# Patient Record
Sex: Male | Born: 1960
Health system: Southern US, Community
[De-identification: ages and names within clinical notes are randomized; demographics above are authoritative.]

## PROBLEM LIST (undated history)

## (undated) DIAGNOSIS — J302 Other seasonal allergic rhinitis: Secondary | ICD-10-CM

## (undated) DIAGNOSIS — E119 Type 2 diabetes mellitus without complications: Secondary | ICD-10-CM

## (undated) DIAGNOSIS — E785 Hyperlipidemia, unspecified: Secondary | ICD-10-CM

## (undated) DIAGNOSIS — K219 Gastro-esophageal reflux disease without esophagitis: Secondary | ICD-10-CM

## (undated) DIAGNOSIS — M1990 Unspecified osteoarthritis, unspecified site: Secondary | ICD-10-CM

## (undated) DIAGNOSIS — N189 Chronic kidney disease, unspecified: Secondary | ICD-10-CM

## (undated) DIAGNOSIS — Z9989 Dependence on other enabling machines and devices: Secondary | ICD-10-CM

## (undated) DIAGNOSIS — I10 Essential (primary) hypertension: Secondary | ICD-10-CM

## (undated) HISTORY — PX: KNEE ARTHROSCOPY: SUR90

## (undated) HISTORY — DX: Unspecified osteoarthritis, unspecified site: M19.90

## (undated) HISTORY — PX: MULTIPLE TOOTH EXTRACTIONS: SHX2053

## (undated) HISTORY — PX: COLONOSCOPY: SHX174

## (undated) HISTORY — PX: UPPER GI ENDOSCOPY: SHX6162

## (undated) HISTORY — PX: HAND SURGERY: SHX662

---

## 1981-12-29 HISTORY — PX: HAND SURGERY: SHX662

## 1984-12-29 HISTORY — PX: KNEE ARTHROSCOPY: SUR90

## 2000-01-22 ENCOUNTER — Emergency Department (HOSPITAL_COMMUNITY): Admission: EM | Admit: 2000-01-22 | Discharge: 2000-01-22 | Payer: Self-pay | Admitting: Emergency Medicine

## 2001-10-04 ENCOUNTER — Emergency Department (HOSPITAL_COMMUNITY): Admission: EM | Admit: 2001-10-04 | Discharge: 2001-10-04 | Payer: Self-pay | Admitting: Emergency Medicine

## 2001-11-23 ENCOUNTER — Emergency Department (HOSPITAL_COMMUNITY): Admission: EM | Admit: 2001-11-23 | Discharge: 2001-11-23 | Payer: Self-pay | Admitting: Emergency Medicine

## 2001-12-23 ENCOUNTER — Encounter: Payer: Self-pay | Admitting: *Deleted

## 2001-12-23 ENCOUNTER — Emergency Department (HOSPITAL_COMMUNITY): Admission: EM | Admit: 2001-12-23 | Discharge: 2001-12-24 | Payer: Self-pay | Admitting: Emergency Medicine

## 2003-04-28 ENCOUNTER — Ambulatory Visit (HOSPITAL_BASED_OUTPATIENT_CLINIC_OR_DEPARTMENT_OTHER): Admission: RE | Admit: 2003-04-28 | Discharge: 2003-04-28 | Payer: Self-pay | Admitting: Urology

## 2005-01-02 ENCOUNTER — Emergency Department (HOSPITAL_COMMUNITY): Admission: EM | Admit: 2005-01-02 | Discharge: 2005-01-02 | Payer: Self-pay | Admitting: Emergency Medicine

## 2005-01-22 ENCOUNTER — Ambulatory Visit: Payer: Self-pay | Admitting: Internal Medicine

## 2005-03-28 ENCOUNTER — Ambulatory Visit: Payer: Self-pay | Admitting: Family Medicine

## 2005-08-22 ENCOUNTER — Ambulatory Visit: Payer: Self-pay | Admitting: Internal Medicine

## 2005-08-28 ENCOUNTER — Encounter: Admission: RE | Admit: 2005-08-28 | Discharge: 2005-08-28 | Payer: Self-pay | Admitting: Internal Medicine

## 2005-08-28 ENCOUNTER — Ambulatory Visit: Payer: Self-pay | Admitting: Internal Medicine

## 2005-09-08 ENCOUNTER — Emergency Department (HOSPITAL_COMMUNITY): Admission: EM | Admit: 2005-09-08 | Discharge: 2005-09-09 | Payer: Self-pay | Admitting: Emergency Medicine

## 2005-09-24 ENCOUNTER — Ambulatory Visit: Payer: Self-pay | Admitting: Internal Medicine

## 2005-10-30 ENCOUNTER — Ambulatory Visit: Payer: Self-pay | Admitting: Internal Medicine

## 2005-11-24 ENCOUNTER — Ambulatory Visit: Payer: Self-pay | Admitting: Internal Medicine

## 2005-12-19 ENCOUNTER — Emergency Department (HOSPITAL_COMMUNITY): Admission: EM | Admit: 2005-12-19 | Discharge: 2005-12-19 | Payer: Self-pay | Admitting: *Deleted

## 2006-01-04 ENCOUNTER — Emergency Department (HOSPITAL_COMMUNITY): Admission: EM | Admit: 2006-01-04 | Discharge: 2006-01-04 | Payer: Self-pay | Admitting: Emergency Medicine

## 2006-01-09 ENCOUNTER — Ambulatory Visit: Payer: Self-pay | Admitting: Internal Medicine

## 2006-02-25 ENCOUNTER — Ambulatory Visit: Payer: Self-pay | Admitting: Internal Medicine

## 2006-02-26 ENCOUNTER — Ambulatory Visit: Payer: Self-pay | Admitting: Internal Medicine

## 2006-03-11 ENCOUNTER — Ambulatory Visit: Payer: Self-pay | Admitting: Internal Medicine

## 2006-04-24 ENCOUNTER — Ambulatory Visit (HOSPITAL_COMMUNITY): Admission: RE | Admit: 2006-04-24 | Discharge: 2006-04-24 | Payer: Self-pay | Admitting: Orthopedic Surgery

## 2006-08-03 ENCOUNTER — Encounter: Payer: Self-pay | Admitting: Internal Medicine

## 2007-05-28 ENCOUNTER — Encounter: Payer: Self-pay | Admitting: Internal Medicine

## 2008-01-07 ENCOUNTER — Telehealth (INDEPENDENT_AMBULATORY_CARE_PROVIDER_SITE_OTHER): Payer: Self-pay | Admitting: *Deleted

## 2010-07-28 ENCOUNTER — Emergency Department (HOSPITAL_COMMUNITY): Admission: EM | Admit: 2010-07-28 | Discharge: 2010-07-28 | Payer: Self-pay | Admitting: Emergency Medicine

## 2011-01-19 ENCOUNTER — Encounter: Payer: Self-pay | Admitting: Orthopedic Surgery

## 2011-07-07 ENCOUNTER — Ambulatory Visit (INDEPENDENT_AMBULATORY_CARE_PROVIDER_SITE_OTHER): Payer: Self-pay | Admitting: General Surgery

## 2014-03-28 DIAGNOSIS — M25569 Pain in unspecified knee: Secondary | ICD-10-CM | POA: Insufficient documentation

## 2014-06-18 DIAGNOSIS — R9431 Abnormal electrocardiogram [ECG] [EKG]: Secondary | ICD-10-CM | POA: Insufficient documentation

## 2014-06-25 ENCOUNTER — Encounter (HOSPITAL_COMMUNITY): Payer: Self-pay | Admitting: Emergency Medicine

## 2014-06-25 ENCOUNTER — Emergency Department (INDEPENDENT_AMBULATORY_CARE_PROVIDER_SITE_OTHER)
Admission: EM | Admit: 2014-06-25 | Discharge: 2014-06-25 | Disposition: A | Discharge status: Home / Self Care | Payer: BC Managed Care – PPO | Source: Home / Self Care | Attending: Emergency Medicine | Admitting: Emergency Medicine

## 2014-06-25 DIAGNOSIS — M7702 Medial epicondylitis, left elbow: Secondary | ICD-10-CM

## 2014-06-25 DIAGNOSIS — M7700 Medial epicondylitis, unspecified elbow: Secondary | ICD-10-CM

## 2014-06-25 HISTORY — DX: Essential (primary) hypertension: I10

## 2014-06-25 MED ORDER — METHYLPREDNISOLONE (PAK) 4 MG PO TABS: ORAL_TABLET | ORAL | Status: DC

## 2014-06-25 MED ORDER — ACETAMINOPHEN 325 MG PO TABS
650.0000 mg | ORAL_TABLET | Freq: Once | ORAL | Status: AC
  Administered 2014-06-25: 650 mg via ORAL

## 2014-06-25 MED ORDER — ACETAMINOPHEN 325 MG PO TABS
ORAL_TABLET | ORAL | Status: AC
  Filled 2014-06-25: qty 2

## 2014-06-25 NOTE — ED Provider Notes (Signed)
CSN: [id number]     Arrival date & time 06/25/14  0944 History   First MD Initiated Contact with Patient 06/25/14 1035     Chief Complaint  Patient presents with  . Elbow Pain   (Consider location/radiation/quality/duration/timing/severity/associated sxs/prior Treatment) HPI Comments: Patient reports 2-3 days of left medial elbow discomfort. 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 Cannot recall a specific injury. No reported hx of gout. Denies arm weakness or paraesthesias. Denies chest pain, dyspnea, palpitations. Pain in elbow is precipitated by extension of arm at elbow. No fever/chills PCP: Dr. Adelene Amas. [place]  The history is provided by the patient.    Past Medical History  Diagnosis Date  . Hypertension    Past Surgical History  Procedure Laterality Date  . Hand surgery     No family history on file. History  Substance Use Topics  . Smoking status: Never Smoker   . Smokeless tobacco: Not on file  . Alcohol Use: No    Review of Systems  All other systems reviewed and are negative.   Allergies  Review of patient's allergies indicates no known allergies.  Home Medications   Prior to Admission medications   Medication Sig Start Date End Date Taking? Authorizing Provider  AMLODIPINE BESYLATE PO Take by mouth.   Yes Historical Provider, MD  nebivolol (BYSTOLIC) 10 MG tablet Take 10 mg by mouth daily.   Yes Historical Provider, MD  OVER THE COUNTER MEDICATION hctz   Yes Historical Provider, MD  POTASSIUM CHLORIDE ER PO Take by mouth.   Yes Historical Provider, MD  methylPREDNIsolone (MEDROL DOSPACK) 4 MG tablet follow package directions 06/25/14   Jess BartersJennifer Lee Presson, PA   BP 171/107  Pulse 67  Temp(Src) 98.1 F (36.7 C) (Oral)  Resp 16  SpO2 96% Physical Exam  Nursing note and vitals reviewed. Constitutional: He is oriented to person, place, and time. He appears well-developed and well-nourished. No distress.  Patient reports  he has not yet taken his blood pressure medication for the day.   HENT:  Head: Normocephalic and atraumatic.  Eyes: Conjunctivae are normal.  Cardiovascular: Normal rate.   Pulmonary/Chest: Effort normal.  Musculoskeletal:       Left elbow: He exhibits decreased range of motion, swelling and effusion. He exhibits no deformity and no laceration. Tenderness found. Medial epicondyle tenderness noted.  Difficulty and discomfort with extension at left elbow. CSM exam of LUE intact.   Neurological: He is alert and oriented to person, place, and time.  Skin: Skin is warm and dry.  Psychiatric: He has a normal mood and affect. His behavior is normal.    ED Course  Procedures (including critical care time) Labs Review Labs Reviewed - No data to display  Imaging Review No results found.   MDM   1. Epicondylitis elbow, medial, left   Tylenol and medrol dose pack as prescribed. Ice and elevation to reduce inflammation and swelling. Advised to take BP medication as directed and is BP remains elevated, to follow up with his PCP ECG: NSR at 68 bpm with nonspecific ST/T wave changes.   Jess BartersJennifer Lee SalemPresson, GeorgiaPA 06/25/14 1126

## 2014-06-25 NOTE — ED Notes (Signed)
Left elbow pain with onset 2 days ago.  No known injury, no fall, no new behaviors.  Patient reports pain is moving into forearm and slightly above elbow.

## 2014-06-25 NOTE — Discharge Instructions (Signed)
Medial Epicondylitis (Golfer's Elbow) with Rehab Medial epicondylitis involves inflammation and pain around the inner (medial) portion of the elbow. This pain is caused by inflammation of the tendons in the forearm that flex (bring down) the wrist. Medial epicondylitis is also called golfer's elbow, because it is common among golfers. However, it may occur in any individual who flexes the wrist regularly. If medial epicondylitis is left untreated, it may become a chronic problem. SYMPTOMS   Pain, tenderness, or inflammation over the inner (medial) side of the elbow.  Pain or weakness with gripping activities.  Pain that increases with wrist twisting motions (using a screwdriver, playing golf, bowling). CAUSES  Medial epicondylitis is caused by inflammation of the tendons that flex the wrist. Causes of injury may include:  Chronic, repetitive stress and strain to the tendons that run from the wrist and forearm to the elbow.  Sudden strain on the forearm, including wrist snap when serving balls with racquet sports, or throwing a baseball. RISK INCREASES WITH:  Sports or occupations that require repetitive and/or strenuous forearm and wrist movements (pitching a baseball, golfing, carpentry).  Poor wrist and forearm strength and flexibility.  Failure to warm up properly before activity.  Resuming activity before healing, rehabilitation, and conditioning are complete. PREVENTION   Warm up and stretch properly before activity.  Maintain physical fitness:  Strength, flexibility, and endurance.  Cardiovascular fitness.  Wear and use properly fitted equipment.  Learn and use proper technique and have a coach correct improper technique.  Wear a tennis elbow (counterforce) brace. PROGNOSIS  The course of this condition depends on the degree of the injury. If treated properly, acute cases (symptoms lasting less than 4 weeks) are often resolved in 2 to 6 weeks. Chronic (longer lasting  cases) often resolve in 3 to 6 months, but may require physical therapy. RELATED COMPLICATIONS   Frequently recurring symptoms, resulting in a chronic problem. Properly treating the problem the first time decreases frequency of recurrence.  Chronic inflammation, scarring, and partial tendon tear, requiring surgery.  Delayed healing or resolution of symptoms. TREATMENT  Treatment first involves the use of ice and medicine, to reduce pain and inflammation. Strengthening and stretching exercises may reduce discomfort, if performed regularly. These exercises may be performed at home, if the condition is an acute injury. Chronic cases may require a referral to a physical therapist for evaluation and treatment. Your caregiver may advise a corticosteroid injection to help reduce inflammation. Rarely, surgery is needed. MEDICATION  If pain medicine is needed, nonsteroidal anti-inflammatory medicines (aspirin and ibuprofen), or other minor pain relievers (acetaminophen), are often advised.  Do not take pain medicine for 7 days before surgery.  Prescription pain relievers may be given, if your caregiver thinks they are needed. Use only as directed and only as much as you need.  Corticosteroid injections may be recommended. These injections should be reserved only for the most severe cases, because they can only be given a certain number of times. HEAT AND COLD  Cold treatment (icing) should be applied for 10 to 15 minutes every 2 to 3 hours for inflammation and pain, and immediately after activity that aggravates your symptoms. Use ice packs or an ice massage.  Heat treatment may be used before performing stretching and strengthening activities prescribed by your caregiver, physical therapist, or athletic trainer. Use a heat pack or a warm water soak. SEEK MEDICAL CARE IF: Symptoms get worse or do not improve in 2 weeks, despite treatment. EXERCISES  RANGE OF MOTION (  ROM) AND STRETCHING EXERCISES -  Epicondylitis, Medial (Golfer's Elbow) These exercises may help you when beginning to rehabilitate your injury. Your symptoms may go away with or without further involvement from your physician, physical therapist or athletic trainer. While completing these exercises, remember:   Restoring tissue flexibility helps normal motion to return to the joints. This allows healthier, less painful movement and activity.  An effective stretch should be held for at least 30 seconds.  A stretch should never be painful. You should only feel a gentle lengthening or release in the stretched tissue. RANGE OF MOTION - Wrist Flexion, Active-Assisted  Extend your right / left elbow with your fingers pointing down.*  Gently pull the back of your hand towards you, until you feel a gentle stretch on the top of your forearm.  Hold this position for __________ seconds. Repeat __________ times. Complete this exercise __________ times per day.  *If directed by your physician, physical therapist or athletic trainer, complete this stretch with your elbow bent, rather than extended. RANGE OF MOTION - Wrist Extension, Active-Assisted  Extend your right / left elbow and turn your palm upwards.*  Gently pull your palm and fingertips back, so your wrist extends and your fingers point more toward the ground.  You should feel a gentle stretch on the inside of your forearm.  Hold this position for __________ seconds. Repeat __________ times. Complete this exercise __________ times per day. *If directed by your physician, physical therapist or athletic trainer, complete this stretch with your elbow bent, rather than extended. STRETCH - Wrist Extension   Place your right / left fingertips on a tabletop leaving your elbow slightly bent. Your fingers should point backwards.  Gently press your fingers and palm down onto the table, by straightening your elbow. You should feel a stretch on the inside of your forearm.  Hold  this position for __________ seconds. Repeat __________ times. Complete this stretch __________ times per day.  STRENGTHENING EXERCISES - Epicondylitis, Medial (Golfer's Elbow) These exercises may help you when beginning to rehabilitate your injury. They may resolve your symptoms with or without further involvement from your physician, physical therapist or athletic trainer. While completing these exercises, remember:   Muscles can gain both the endurance and the strength needed for everyday activities through controlled exercises.  Complete these exercises as instructed by your physician, physical therapist or athletic trainer. Increase the resistance and repetitions only as guided.  You may experience muscle soreness or fatigue, but the pain or discomfort you are trying to eliminate should never worsen during these exercises. If this pain does get worse, stop and make sure you are following the directions exactly. If the pain is still present after adjustments, discontinue the exercise until you can discuss the trouble with your caregiver. STRENGTH - Wrist Flexors  Sit with your right / left forearm palm-up, and fully supported on a table or countertop. Your elbow should be resting below the height of your shoulder. Allow your wrist to extend over the edge of the surface.  Loosely holding a __________ weight, or a piece of rubber exercise band or tubing, slowly curl your hand up toward your forearm.  Hold this position for __________ seconds. Slowly lower the wrist back to the starting position in a controlled manner. Repeat __________ times. Complete this exercise __________ times per day.  STRENGTH - Wrist Extensors  Sit with your right / left forearm palm-down and fully supported. Your elbow should be resting below the height of your shoulder.   Allow your wrist to extend over the edge of the surface.  Loosely holding a __________ weight, or a piece of rubber exercise band or tubing, slowly  curl your hand up toward your forearm.  Hold this position for __________ seconds. Slowly lower the wrist back to the starting position in a controlled manner. Repeat __________ times. Complete this exercise __________ times per day.  STRENGTH - Ulnar Deviators  Stand with a ____________________ weight in your right / left hand, or sit while holding a rubber exercise band or tubing, with your healthy arm supported on a table or countertop.  Move your wrist so that your pinkie travels toward your forearm and your thumb moves away from your forearm.  Hold this position for __________ seconds and then slowly lower the wrist back to the starting position. Repeat __________ times. Complete this exercise __________ times per day STRENGTH - Grip   Grasp a tennis ball, a dense sponge, or a large, rolled sock in your hand.  Squeeze as hard as you can, without increasing any pain.  Hold this position for __________ seconds. Release your grip slowly. Repeat __________ times. Complete this exercise __________ times per day.  STRENGTH - Forearm Supinators   Sit with your right / left forearm supported on a table, keeping your elbow below shoulder height. Rest your hand over the edge, palm down.  Gently grip a hammer or a soup ladle.  Without moving your elbow, slowly turn your palm and hand upward to a "thumbs-up" position.  Hold this position for __________ seconds. Slowly return to the starting position. Repeat __________ times. Complete this exercise __________ times per day.  STRENGTH - Forearm Pronators  Sit with your right / left forearm supported on a table, keeping your elbow below shoulder height. Rest your hand over the edge, palm up.  Gently grip a hammer or a soup ladle.  Without moving your elbow, slowly turn your palm and hand upward to a "thumbs-up" position.  Hold this position for __________ seconds. Slowly return to the starting position. Repeat __________ times. Complete  this exercise __________ times per day.  Document Released: 12/15/2005 Document Revised: 03/08/2012 Document Reviewed: 03/29/2009 [employer] Patient Information 2015 [employer]. This information is not intended to replace advice given to you by your health care provider. Make sure you discuss any questions you have with your health care provider.  

## 2014-06-26 NOTE — ED Provider Notes (Signed)
Medical screening examination/treatment/procedure(s) were performed by non-physician practitioner and as supervising physician I was immediately available for consultation/collaboration.  Leslee Homeavid Keller, M.D.  Reuben Likesavid C Keller, MD 06/26/14 [phone]

## 2015-01-05 DIAGNOSIS — M25562 Pain in left knee: Secondary | ICD-10-CM

## 2015-01-05 DIAGNOSIS — M25561 Pain in right knee: Secondary | ICD-10-CM | POA: Insufficient documentation

## 2015-01-05 DIAGNOSIS — M25552 Pain in left hip: Secondary | ICD-10-CM | POA: Insufficient documentation

## 2015-05-30 DIAGNOSIS — M170 Bilateral primary osteoarthritis of knee: Secondary | ICD-10-CM | POA: Insufficient documentation

## 2015-08-03 ENCOUNTER — Other Ambulatory Visit: Payer: Self-pay | Admitting: Family Medicine

## 2015-08-03 ENCOUNTER — Ambulatory Visit
Admission: RE | Admit: 2015-08-03 | Discharge: 2015-08-03 | Disposition: A | Discharge status: Home / Self Care | Payer: BLUE CROSS/BLUE SHIELD | Source: Ambulatory Visit | Attending: Family Medicine | Admitting: Family Medicine

## 2015-08-03 DIAGNOSIS — M25522 Pain in left elbow: Secondary | ICD-10-CM

## 2015-09-01 ENCOUNTER — Emergency Department (HOSPITAL_COMMUNITY)
Admission: EM | Admit: 2015-09-01 | Discharge: 2015-09-02 | Disposition: A | Discharge status: Home / Self Care | Payer: BLUE CROSS/BLUE SHIELD | Attending: Emergency Medicine | Admitting: Emergency Medicine

## 2015-09-01 ENCOUNTER — Encounter (HOSPITAL_COMMUNITY): Payer: Self-pay | Admitting: Emergency Medicine

## 2015-09-01 ENCOUNTER — Emergency Department (HOSPITAL_COMMUNITY): Payer: BLUE CROSS/BLUE SHIELD

## 2015-09-01 DIAGNOSIS — S92412A Displaced fracture of proximal phalanx of left great toe, initial encounter for closed fracture: Secondary | ICD-10-CM | POA: Diagnosis not present

## 2015-09-01 DIAGNOSIS — S50312A Abrasion of left elbow, initial encounter: Secondary | ICD-10-CM | POA: Insufficient documentation

## 2015-09-01 DIAGNOSIS — S92312A Displaced fracture of first metatarsal bone, left foot, initial encounter for closed fracture: Secondary | ICD-10-CM | POA: Diagnosis not present

## 2015-09-01 DIAGNOSIS — S0181XA Laceration without foreign body of other part of head, initial encounter: Secondary | ICD-10-CM | POA: Insufficient documentation

## 2015-09-01 DIAGNOSIS — Y998 Other external cause status: Secondary | ICD-10-CM | POA: Diagnosis not present

## 2015-09-01 DIAGNOSIS — S80812A Abrasion, left lower leg, initial encounter: Secondary | ICD-10-CM | POA: Diagnosis not present

## 2015-09-01 DIAGNOSIS — S92522A Displaced fracture of medial phalanx of left lesser toe(s), initial encounter for closed fracture: Secondary | ICD-10-CM | POA: Diagnosis not present

## 2015-09-01 DIAGNOSIS — S92512A Displaced fracture of proximal phalanx of left lesser toe(s), initial encounter for closed fracture: Secondary | ICD-10-CM | POA: Diagnosis not present

## 2015-09-01 DIAGNOSIS — S92332A Displaced fracture of third metatarsal bone, left foot, initial encounter for closed fracture: Secondary | ICD-10-CM | POA: Insufficient documentation

## 2015-09-01 DIAGNOSIS — Y9389 Activity, other specified: Secondary | ICD-10-CM | POA: Diagnosis not present

## 2015-09-01 DIAGNOSIS — S92352A Displaced fracture of fifth metatarsal bone, left foot, initial encounter for closed fracture: Secondary | ICD-10-CM | POA: Diagnosis not present

## 2015-09-01 DIAGNOSIS — S80811A Abrasion, right lower leg, initial encounter: Secondary | ICD-10-CM | POA: Diagnosis not present

## 2015-09-01 DIAGNOSIS — S92912A Unspecified fracture of left toe(s), initial encounter for closed fracture: Secondary | ICD-10-CM

## 2015-09-01 DIAGNOSIS — S99912A Unspecified injury of left ankle, initial encounter: Secondary | ICD-10-CM | POA: Diagnosis not present

## 2015-09-01 DIAGNOSIS — S92342A Displaced fracture of fourth metatarsal bone, left foot, initial encounter for closed fracture: Secondary | ICD-10-CM | POA: Insufficient documentation

## 2015-09-01 DIAGNOSIS — I10 Essential (primary) hypertension: Secondary | ICD-10-CM | POA: Diagnosis not present

## 2015-09-01 DIAGNOSIS — S99922A Unspecified injury of left foot, initial encounter: Secondary | ICD-10-CM | POA: Diagnosis present

## 2015-09-01 DIAGNOSIS — Y92410 Unspecified street and highway as the place of occurrence of the external cause: Secondary | ICD-10-CM | POA: Diagnosis not present

## 2015-09-01 DIAGNOSIS — Z79899 Other long term (current) drug therapy: Secondary | ICD-10-CM | POA: Insufficient documentation

## 2015-09-01 DIAGNOSIS — S92302A Fracture of unspecified metatarsal bone(s), left foot, initial encounter for closed fracture: Secondary | ICD-10-CM
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
## 2015-09-02 ENCOUNTER — Emergency Department (HOSPITAL_COMMUNITY): Payer: BLUE CROSS/BLUE SHIELD

## 2015-09-02 DIAGNOSIS — S92412A Displaced fracture of proximal phalanx of left great toe, initial encounter for closed fracture: Secondary | ICD-10-CM | POA: Diagnosis not present

## 2015-09-02 MED ORDER — HYDROCODONE-ACETAMINOPHEN 5-325 MG PO TABS
1.0000 | ORAL_TABLET | Freq: Once | ORAL | Status: AC
  Administered 2015-09-02: 1 via ORAL
  Filled 2015-09-02: qty 1

## 2015-09-02 MED ORDER — HYDROCODONE-ACETAMINOPHEN 5-325 MG PO TABS: 1.0000 | ORAL_TABLET | ORAL | Status: DC | PRN
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
## 2015-09-02 NOTE — Discharge Instructions (Signed)
-   Call Dr. Dion Saucier with orthopedics to schedule a follow up appointment - Vicodin as needed for pain control - Ice left foot and left hand for pain control - Return to ED with worsening left foot pain, increased swelling of foot, loss of sensation in foot or toes, fevers, color changes of foot or toes, headaches, vision changes, new or worsening pain elsewhere or further worsening of symptoms

## 2015-09-04 DIAGNOSIS — M25572 Pain in left ankle and joints of left foot: Secondary | ICD-10-CM

## 2015-09-04 DIAGNOSIS — S92255A Nondisplaced fracture of navicular [scaphoid] of left foot, initial encounter for closed fracture: Secondary | ICD-10-CM | POA: Insufficient documentation

## 2015-09-04 DIAGNOSIS — M25571 Pain in right ankle and joints of right foot: Secondary | ICD-10-CM | POA: Insufficient documentation

## 2015-09-04 DIAGNOSIS — M79671 Pain in right foot: Secondary | ICD-10-CM | POA: Insufficient documentation

## 2015-09-04 DIAGNOSIS — M79672 Pain in left foot: Secondary | ICD-10-CM

## 2016-02-10 ENCOUNTER — Emergency Department (HOSPITAL_COMMUNITY): Payer: BLUE CROSS/BLUE SHIELD

## 2016-02-10 ENCOUNTER — Encounter (HOSPITAL_COMMUNITY): Payer: Self-pay

## 2016-02-10 ENCOUNTER — Emergency Department (HOSPITAL_COMMUNITY)
Admission: EM | Admit: 2016-02-10 | Discharge: 2016-02-10 | Disposition: A | Discharge status: Home / Self Care | Payer: BLUE CROSS/BLUE SHIELD | Attending: Emergency Medicine | Admitting: Emergency Medicine

## 2016-02-10 DIAGNOSIS — I10 Essential (primary) hypertension: Secondary | ICD-10-CM | POA: Insufficient documentation

## 2016-02-10 DIAGNOSIS — M25522 Pain in left elbow: Secondary | ICD-10-CM | POA: Diagnosis present

## 2016-02-10 DIAGNOSIS — M7712 Lateral epicondylitis, left elbow: Secondary | ICD-10-CM | POA: Diagnosis not present

## 2016-02-10 DIAGNOSIS — Z79899 Other long term (current) drug therapy: Secondary | ICD-10-CM | POA: Diagnosis not present

## 2016-02-10 LAB — SYNOVIAL CELL COUNT + DIFF, W/ CRYSTALS
Crystals, Fluid: NONE SEEN
Lymphocytes-Synovial Fld: 12 % (ref 0–20)
Neutrophil, Synovial: 88 % — ABNORMAL HIGH (ref 0–25)

## 2016-02-10 LAB — CBC WITH DIFFERENTIAL/PLATELET
Basophils Absolute: 0 K/uL (ref 0.0–0.1)
Basophils Relative: 0 %
Eosinophils Absolute: 0.1 K/uL (ref 0.0–0.7)
Eosinophils Relative: 1 %
HCT: 37.9 % — ABNORMAL LOW (ref 39.0–52.0)
Hemoglobin: 12.5 g/dL — ABNORMAL LOW (ref 13.0–17.0)
Lymphocytes Relative: 15 %
Lymphs Abs: 2 K/uL (ref 0.7–4.0)
MCH: 28 pg (ref 26.0–34.0)
MCHC: 33 g/dL (ref 30.0–36.0)
MCV: 84.8 fL (ref 78.0–100.0)
Monocytes Absolute: 1 K/uL (ref 0.1–1.0)
Monocytes Relative: 8 %
Neutro Abs: 9.8 K/uL — ABNORMAL HIGH (ref 1.7–7.7)
Neutrophils Relative %: 76 %
Platelets: 279 K/uL (ref 150–400)
RBC: 4.47 MIL/uL (ref 4.22–5.81)
RDW: 13.4 % (ref 11.5–15.5)
WBC: 12.9 K/uL — ABNORMAL HIGH (ref 4.0–10.5)

## 2016-02-10 LAB — GLUCOSE, SYNOVIAL FLUID: Glucose, Synovial Fluid: <20 mg/dL

## 2016-02-10 LAB — BASIC METABOLIC PANEL WITH GFR
Anion gap: 8 (ref 5–15)
BUN: 15 mg/dL (ref 6–20)
CO2: 28 mmol/L (ref 22–32)
Calcium: 9.2 mg/dL (ref 8.9–10.3)
Chloride: 102 mmol/L (ref 101–111)
Creatinine, Ser: 0.93 mg/dL (ref 0.61–1.24)
GFR calc Af Amer: >60 mL/min
GFR calc non Af Amer: >60 mL/min
Glucose, Bld: 140 mg/dL — ABNORMAL HIGH (ref 65–99)
Potassium: 3 mmol/L — ABNORMAL LOW (ref 3.5–5.1)
Sodium: 138 mmol/L (ref 135–145)

## 2016-02-10 LAB — SEDIMENTATION RATE: Sed Rate: 25 mm/h — ABNORMAL HIGH (ref 0–16)

## 2016-02-10 LAB — URIC ACID, SYNOVIAL FLUID: Uric Acid, Synovial Fluid: 7.7 mg/dL (ref 0.0–8.0)

## 2016-02-10 LAB — PROTEIN, SYNOVIAL FLUID: Protein, Synovial Fluid: 4.1 g/dL — ABNORMAL HIGH (ref 1.0–3.0)

## 2016-02-10 MED ORDER — PREDNISONE 20 MG PO TABS
60.0000 mg | ORAL_TABLET | Freq: Once | ORAL | Status: AC
  Administered 2016-02-10: 60 mg via ORAL
  Filled 2016-02-10: qty 3

## 2016-02-10 MED ORDER — OXYCODONE-ACETAMINOPHEN 5-325 MG PO TABS
1.0000 | ORAL_TABLET | Freq: Once | ORAL | Status: AC
  Administered 2016-02-10: 1 via ORAL
  Filled 2016-02-10: qty 1

## 2016-02-10 MED ORDER — LIDOCAINE-EPINEPHRINE 2 %-1:100000 IJ SOLN
20.0000 mL | Freq: Once | INTRAMUSCULAR | Status: AC
  Administered 2016-02-10: 20 mL via INTRADERMAL
  Filled 2016-02-10: qty 1

## 2016-02-10 MED ORDER — OXYCODONE-ACETAMINOPHEN 5-325 MG PO TABS: 2.0000 | ORAL_TABLET | ORAL | Status: DC | PRN

## 2016-02-10 MED ORDER — POTASSIUM CHLORIDE 10 MEQ/100ML IV SOLN
10.0000 meq | Freq: Once | INTRAVENOUS | Status: AC
  Administered 2016-02-10: 10 meq via INTRAVENOUS
  Filled 2016-02-10: qty 100

## 2016-02-10 MED ORDER — PREDNISONE 10 MG PO TABS: 60.0000 mg | ORAL_TABLET | Freq: Every day | ORAL | Status: DC

## 2016-02-10 NOTE — Discharge Instructions (Signed)
°  Tendinitis Tendinitis is swelling and inflammation of the tendons. Tendons are band-like tissues that connect muscle to bone. Tendinitis commonly occurs in the:   Shoulders (rotator cuff).  Heels (Achilles tendon).  Elbows (triceps tendon). CAUSES Tendinitis is usually caused by overusing the tendon, muscles, and joints involved. When the tissue surrounding a tendon (synovium) becomes inflamed, it is called tenosynovitis. Tendinitis commonly develops in people whose jobs require repetitive motions. SYMPTOMS  Pain.  Tenderness.  Mild swelling. DIAGNOSIS Tendinitis is usually diagnosed by physical exam. Your health care provider may also order X-rays or other imaging tests. TREATMENT Your health care provider may recommend certain medicines or exercises for your treatment. HOME CARE INSTRUCTIONS   Use a sling or splint for as long as directed by your health care provider until the pain decreases.  Put ice on the injured area.  Put ice in a plastic bag.  Place a towel between your skin and the bag.  Leave the ice on for 15-20 minutes, 3-4 times a day, or as directed by your health care provider.  Avoid using the limb while the tendon is painful. Perform gentle range of motion exercises only as directed by your health care provider. Stop exercises if pain or discomfort increase, unless directed otherwise by your health care provider.  Only take over-the-counter or prescription medicines for pain, discomfort, or fever as directed by your health care provider. SEEK MEDICAL CARE IF:   Your pain and swelling increase.  You develop new, unexplained symptoms, especially increased numbness in the hands. MAKE SURE YOU:   Understand these instructions.  Will watch your condition.  Will get help right away if you are not doing well or get worse.   This information is not intended to replace advice given to you by your health care provider. Make sure you discuss any questions you  have with your health care provider.  Follow up with orthopedic provider tomorrow for re-evaluation. Take steroids as prescribed. Return to the ED if you experience severe worsening of your symptoms, increased redness or warmth of elbow, fever, chills, decreased range of motion of elbow.

## 2016-02-10 NOTE — ED Provider Notes (Signed)
CSN: [id number]     Arrival date & time 02/10/16  0808 History   First MD Initiated Contact with Patient 02/10/16 0825     Chief Complaint  Patient presents with  . Elbow Pain     (Consider location/radiation/quality/duration/timing/severity/associated sxs/prior Treatment) HPI  Kristopher [name] is a 55 y.o M who presents to the emergency department today complaining of left elbow pain and swelling. Patient states that his left elbow began hurting 2 days ago. His elbow is now warm to the touch and swollen. Patient is unable to extend his arm due to pain. Patient has history of same symptoms and was diagnosed with epicondylitis. He had a joint effusion at this time. He was treated with anti-inflammatories and his symptoms resolved. No history of gout or septic joint. No known trauma or injury. Denies fevers, chills. Patient has not tried anything to relieve his symptoms.   Past Medical History  Diagnosis Date  . Hypertension    Past Surgical History  Procedure Laterality Date  . Hand surgery     History reviewed. No pertinent family history. Social History  Substance Use Topics  . Smoking status: Never Smoker   . Smokeless tobacco: None  . Alcohol Use: Yes     Comment: occasional    Review of Systems  All other systems reviewed and are negative.     Allergies  Review of patient's allergies indicates no known allergies.  Home Medications   Prior to Admission medications   Medication Sig Start Date End Date Taking? Authorizing Provider  AMLODIPINE BESYLATE PO Take by mouth.    Historical Provider, MD  amLODipine-benazepril (LOTREL) 5-20 MG per capsule Take 1 capsule by mouth daily.    Historical Provider, MD  HYDROcodone-acetaminophen (NORCO/VICODIN) 5-325 MG per tablet Take 1-2 tablets by mouth every 4 (four) hours as needed. 09/02/15   Stevi Barrett, PA-C  methylPREDNIsolone (MEDROL DOSPACK) 4 MG tablet follow package directions Patient not taking: Reported on 09/01/2015  06/25/14   Audelia Hives Presson, PA  nebivolol (BYSTOLIC) 10 MG tablet Take 10 mg by mouth daily.    Historical Provider, MD  OVER THE COUNTER MEDICATION hctz    Historical Provider, MD  potassium chloride (K-DUR) 10 MEQ tablet Take 10 mEq by mouth daily.    Historical Provider, MD  POTASSIUM CHLORIDE ER PO Take by mouth.    Historical Provider, MD  triamterene-hydrochlorothiazide (MAXZIDE-25) 37.5-25 MG per tablet Take 1 tablet by mouth daily.    Historical Provider, MD   BP 170/96 mmHg  Pulse 81  Temp(Src) 98.6 F (37 C) (Oral)  Resp 18  SpO2 97% Physical Exam  Constitutional: He is oriented to person, place, and time. He appears well-developed and well-nourished. No distress.  HENT:  Head: Normocephalic and atraumatic.  Eyes: Conjunctivae are normal. Right eye exhibits no discharge. Left eye exhibits no discharge. No scleral icterus.  Cardiovascular: Normal rate.   Pulmonary/Chest: Effort normal.  Musculoskeletal:       Left elbow: He exhibits decreased range of motion, swelling and effusion. Tenderness found. Lateral epicondyle tenderness noted.  Neurological: He is alert and oriented to person, place, and time. Coordination normal.  Skin: Skin is warm and dry. No rash noted. He is not diaphoretic. No erythema. No pallor.  Psychiatric: He has a normal mood and affect. His behavior is normal.  Nursing note and vitals reviewed.   ED Course  Procedures (including critical care time)  Apiration of blood/fluid Performed by: Dr. Darl Householder Consent obtained. Required items: required  blood products, implants, devices, and special equipment available Patient identity confirmed: verbally with patient Time out: Immediately prior to procedure a "time out" was called to verify the correct patient, procedure, equipment, support staff and site/side marked as required. Preparation: Patient was prepped and draped in the usual sterile fashion. Patient tolerance: Patient tolerated the procedure well  with no immediate complications.  Location of aspiration: left elbow    Labs Review Labs Reviewed  PROTEIN, SYNOVIAL FLUID - Abnormal; Notable for the following:    Protein, Synovial Fluid 4.1 (*)    All other components within normal limits  SYNOVIAL CELL COUNT + DIFF, W/ CRYSTALS - Abnormal; Notable for the following:    Color, Synovial ORANGE (*)    Appearance-Synovial TURBID (*)    Neutrophil, Synovial 88 (*)    All other components within normal limits  BASIC METABOLIC PANEL - Abnormal; Notable for the following:    Potassium 3.0 (*)    Glucose, Bld 140 (*)    All other components within normal limits  CBC WITH DIFFERENTIAL/PLATELET - Abnormal; Notable for the following:    WBC 12.9 (*)    Hemoglobin 12.5 (*)    HCT 37.9 (*)    Neutro Abs 9.8 (*)    All other components within normal limits  SEDIMENTATION RATE - Abnormal; Notable for the following:    Sed Rate 25 (*)    All other components within normal limits  BODY FLUID CULTURE  GLUCOSE, SYNOVIAL FLUID  URIC ACID, SYNOVIAL FLUID    Imaging Review Dg Elbow Complete Left  02/10/2016  CLINICAL DATA:  Pain and swelling for 2 days EXAM: LEFT ELBOW - COMPLETE 3+ VIEW COMPARISON:  August 03, 2015 FINDINGS: Frontal, lateral, and bilateral oblique views were obtained. There is generalized soft tissue swelling. There is no fracture or dislocation. There is no appreciable joint effusion. There is osteoarthritic change in the elbow joint with areas of spurring in the distal humerus and proximal radial regions. No erosive change. IMPRESSION: Soft tissue swelling. Osteoarthritic change. No fracture or dislocation. Electronically Signed   By: Lowella Grip III M.D.   On: 02/10/2016 09:30   I have personally reviewed and evaluated these images and lab results as part of my medical decision-making.   EKG Interpretation None      MDM   Final diagnoses:  Lateral epicondylitis, left    28s external male presents to the  emergency department today complaining of left elbow pain, swelling and redness. Patient has had episodes of this in the past and was diagnosed with epicondylitis and given anti-inflammatories with resolution of his symptoms. However, today patient states the pain is worse and he is unable to straighten his elbow all the way. Patient has approximately 30 of range of motion with significant pain. Elbow is red and warm. No history of gout. Concern for septic joint. No fevers. Dr. Darl Householder was able to visualize a joint effusion by ultrasound. Joint aspiration was performed. ESR was mildly elevated at 25. Unfortunately, the specimen clotted and the white blood cell count was unable to be obtained. Neutrophils elevated at 88. No organisms seen on Gram stain. Mild leukocytosis at 12.9. Suspect this is due to inflammation, less likely septic joint given these findings. Spoke with Dr. Mardelle Matte who agrees and recommends steroids and pain medication. He will follow the cultures from her synovial fluid and follow-up with the patient and his office on Monday. Return precautions outlined in patient discharge instructions.  Patient was discussed with and  seen by Dr. Darl Householder who agrees with the treatment plan.      Dondra Spry [place], PA-C 02/11/16 1222  Wandra Arthurs, MD 02/13/16 [id number]

## 2016-02-10 NOTE — ED Notes (Signed)
Pt with elbow pain since Friday.  Left elbow swollen.  No injury recalled.

## 2016-02-14 LAB — BODY FLUID CULTURE
Culture: NO GROWTH
Gram Stain: NONE SEEN

## 2016-07-15 DIAGNOSIS — I10 Essential (primary) hypertension: Secondary | ICD-10-CM | POA: Insufficient documentation

## 2016-08-08 DIAGNOSIS — M170 Bilateral primary osteoarthritis of knee: Secondary | ICD-10-CM | POA: Diagnosis not present

## 2016-09-09 DIAGNOSIS — K5730 Diverticulosis of large intestine without perforation or abscess without bleeding: Secondary | ICD-10-CM | POA: Insufficient documentation
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
## 2016-10-24 ENCOUNTER — Emergency Department (HOSPITAL_COMMUNITY): Payer: Worker's Compensation

## 2016-10-24 ENCOUNTER — Emergency Department (HOSPITAL_COMMUNITY)
Admission: EM | Admit: 2016-10-24 | Discharge: 2016-10-24 | Disposition: A | Discharge status: Home / Self Care | Payer: Worker's Compensation | Attending: Emergency Medicine | Admitting: Emergency Medicine

## 2016-10-24 DIAGNOSIS — Y990 Civilian activity done for income or pay: Secondary | ICD-10-CM | POA: Insufficient documentation

## 2016-10-24 DIAGNOSIS — Y929 Unspecified place or not applicable: Secondary | ICD-10-CM | POA: Insufficient documentation

## 2016-10-24 DIAGNOSIS — Z79899 Other long term (current) drug therapy: Secondary | ICD-10-CM | POA: Insufficient documentation

## 2016-10-24 DIAGNOSIS — I10 Essential (primary) hypertension: Secondary | ICD-10-CM | POA: Insufficient documentation

## 2016-10-24 DIAGNOSIS — Z791 Long term (current) use of non-steroidal anti-inflammatories (NSAID): Secondary | ICD-10-CM | POA: Insufficient documentation

## 2016-10-24 DIAGNOSIS — S0990XA Unspecified injury of head, initial encounter: Secondary | ICD-10-CM | POA: Diagnosis present

## 2016-10-24 DIAGNOSIS — S0101XA Laceration without foreign body of scalp, initial encounter: Secondary | ICD-10-CM | POA: Diagnosis not present

## 2016-10-24 DIAGNOSIS — Y9389 Activity, other specified: Secondary | ICD-10-CM | POA: Insufficient documentation

## 2016-10-24 DIAGNOSIS — W208XXA Other cause of strike by thrown, projected or falling object, initial encounter: Secondary | ICD-10-CM | POA: Insufficient documentation

## 2016-10-24 MED ORDER — AMLODIPINE BESY-BENAZEPRIL HCL 5-20 MG PO CAPS: 1.0000 | ORAL_CAPSULE | Freq: Every day | ORAL | 0 refills | Status: DC

## 2016-10-24 NOTE — ED Triage Notes (Signed)
Pt BIB [facility] c/o large 871ft by 561ft piece of metal fell about 20 ft and struck him in the R parietal area of his head. Horseshoe-shaped lacerated noted to area. Bleeding controlled and covered at this time. Pt denies LOC, fall, blurred vision, or N/V. Only complaint is head pain 7/10. Pt out of his BP meds x2 days. BP is elevated at this time. A&Ox4.

## 2016-10-24 NOTE — ED Notes (Signed)
Bed: WA02 Expected date:  Expected time:  Means of arrival:  Comments: Hold 2 head injury

## 2016-10-24 NOTE — ED Notes (Signed)
Patient d/c'd self care.  F/U and medication reviewed.  Patient verbalized understanding. 

## 2016-10-24 NOTE — ED Notes (Signed)
Patient requesting blood pressure medications until he can get his prescription filled.  Will notify MD.

## 2016-10-24 NOTE — ED Provider Notes (Signed)
[facility]-EMERGENCY DEPT Provider Note   CSN: [id number] Arrival date & time: 10/24/16  1746     History   Chief Complaint Chief Complaint  Patient presents with  . Head Injury    HPI Kristopher McalpineVinson W Babino Sr. is a 55 y.o. male.  Complaint is head injury. Patient was at work at an [employer] ([employer]air[employer] tonight. There was a piece of metal scaffolding called walking" that allows workers to walk along the edge of the aircraft. He was "spotting" another worker who was walking on it. The scaffolding gave way and fell backwards. This was a piece of flat metal scaffolding estimated weight 25-30 pounds. Larey SeatFell off of the edge above his head struck him on his right parietal scalp. He has some bleeding in the laceration. He was not knocked to the ground. Did not lose consciousness. Complains of minimal pain. Does not take blood thinner.  HPI  Past Medical History:  Diagnosis Date  . Hypertension     There are no active problems to display for this patient.   Past Surgical History:  Procedure Laterality Date  . HAND SURGERY         Home Medications    Prior to Admission medications   Medication Sig Start Date End Date Taking? Authorizing Provider  amLODipine-benazepril (LOTREL) 5-20 MG capsule Take 2 capsules by mouth every morning.   Yes Historical Provider, MD  naproxen sodium (ANAPROX) 220 MG tablet Take 220 mg by mouth 2 (two) times daily as needed (knee pain.).   Yes Historical Provider, MD  nebivolol (BYSTOLIC) 10 MG tablet Take 10 mg by mouth daily.   Yes Historical Provider, MD  potassium chloride (K-DUR) 10 MEQ tablet Take 10 mEq by mouth daily.   Yes Historical Provider, MD  triamterene-hydrochlorothiazide (MAXZIDE-25) 37.5-25 MG per tablet Take 1 tablet by mouth daily.   Yes Historical Provider, MD    Family History No family history on file.  Social History Social History  Substance Use Topics  . Smoking status: Never Smoker  . Smokeless tobacco: Not on file  . Alcohol  use Yes     Comment: occasional     Allergies   Review of patient's allergies indicates no known allergies.   Review of Systems Review of Systems  Constitutional: Negative for appetite change, chills, diaphoresis, fatigue and fever.  HENT: Negative for mouth sores, sore throat and trouble swallowing.   Eyes: Negative for visual disturbance.  Respiratory: Negative for cough, chest tightness, shortness of breath and wheezing.   Cardiovascular: Negative for chest pain.  Gastrointestinal: Negative for abdominal distention, abdominal pain, diarrhea, nausea and vomiting.  Endocrine: Negative for polydipsia, polyphagia and polyuria.  Genitourinary: Negative for dysuria, frequency and hematuria.  Musculoskeletal: Negative for gait problem.  Skin: Positive for wound. Negative for color change, pallor and rash.  Neurological: Positive for headaches. Negative for dizziness, syncope and light-headedness.  Hematological: Does not bruise/bleed easily.  Psychiatric/Behavioral: Negative for behavioral problems and confusion.     Physical Exam Updated Vital Signs BP (!) 161/104 (BP Location: Right Arm)   Pulse 62   Temp 98.2 F (36.8 C) (Oral)   Resp 19   Ht 6\' 2"  (1.88 m)   Wt 262 lb (118.8 kg)   SpO2 95%   BMI 33.64 kg/m   Physical Exam  Constitutional: He is oriented to person, place, and time. He appears well-developed and well-nourished. No distress.  HENT:  Head: Normocephalic.    Eyes: Conjunctivae are normal. Pupils are equal, round, and  reactive to light. No scleral icterus.  Neck: Normal range of motion. Neck supple. No thyromegaly present.  Cardiovascular: Normal rate and regular rhythm.  Exam reveals no gallop and no friction rub.   No murmur heard. Pulmonary/Chest: Effort normal and breath sounds normal. No respiratory distress. He has no wheezes. He has no rales.  Abdominal: Soft. Bowel sounds are normal. He exhibits no distension. There is no tenderness. There is no  rebound.  Musculoskeletal: Normal range of motion.  Neurological: He is alert and oriented to person, place, and time.  Skin: Skin is warm and dry. No rash noted.  Psychiatric: He has a normal mood and affect. His behavior is normal.     ED Treatments / Results  Labs (all labs ordered are listed, but only abnormal results are displayed) Labs Reviewed - No data to display  EKG  EKG Interpretation None       Radiology Ct Head Wo Contrast  Result Date: 10/24/2016 CLINICAL DATA:  Right parietal impact with piece of falling metal from scaffolding. EXAM: CT HEAD WITHOUT CONTRAST TECHNIQUE: Contiguous axial images were obtained from the base of the skull through the vertex without intravenous contrast. COMPARISON:  None. FINDINGS: Brain: No mass lesion, intraparenchymal hemorrhage or extra-axial collection. No evidence of acute cortical infarct. Brain parenchyma and CSF-containing spaces are normal for age. Vascular: No hyperdense vessel or unexpected calcification. Skull: Normal visualized skull base, calvarium and extracranial soft tissues. Sinuses/Orbits: No sinus fluid levels or advanced mucosal thickening. No mastoid effusion. Normal orbits. IMPRESSION: No acute intracranial abnormality. Electronically Signed   By: Deatra Robinson M.D.   On: 10/24/2016 19:09    Procedures Procedures (including critical care time)  Medications Ordered in ED Medications - No data to display   Initial Impression / Assessment and Plan / ED Course  I have reviewed the triage vital signs and the nursing notes.  Pertinent labs & imaging results that were available during my care of the patient were reviewed by me and considered in my medical decision making (see chart for details).  Clinical Course    CT scan. Laceration repair.  Final Clinical Impressions(s) / ED Diagnoses   Final diagnoses:  Injury of head, initial encounter  Laceration of scalp, initial encounter   CT scan shows no acute  traumatic abnormalities. I cleaned this laceration again. This is partial-thickness and does not require approximation with sutures or staples. Discussed basic wound care. Closed head injury precautions.   New Prescriptions New Prescriptions   No medications on file     Rolland Porter, MD 10/24/16 2009

## 2016-10-24 NOTE — Discharge Instructions (Signed)
Tylenol or Motrin for headache.  Keep wound clean. Wash daily with soap and water. Apply antibiotic ointment.  Even minor head injuries can result and headaches, dizziness, mild nausea for the first several days.

## 2016-11-04 DIAGNOSIS — Z9189 Other specified personal risk factors, not elsewhere classified: Secondary | ICD-10-CM | POA: Diagnosis not present

## 2016-11-07 ENCOUNTER — Ambulatory Visit (INDEPENDENT_AMBULATORY_CARE_PROVIDER_SITE_OTHER): Payer: BLUE CROSS/BLUE SHIELD | Admitting: Family Medicine

## 2016-11-07 ENCOUNTER — Ambulatory Visit (INDEPENDENT_AMBULATORY_CARE_PROVIDER_SITE_OTHER): Payer: BLUE CROSS/BLUE SHIELD

## 2016-11-07 VITALS — BP 138/80 | HR 91 | Temp 98.2°F | Resp 18 | Ht 74.0 in | Wt 278.0 lb

## 2016-11-07 DIAGNOSIS — M19022 Primary osteoarthritis, left elbow: Secondary | ICD-10-CM

## 2016-11-07 DIAGNOSIS — M7989 Other specified soft tissue disorders: Secondary | ICD-10-CM | POA: Diagnosis not present

## 2016-11-07 LAB — POCT CBC
Granulocyte percent: 76.5 %G (ref 37–80)
HCT, POC: 36.9 % — AB (ref 43.5–53.7)
HEMOGLOBIN: 12.9 g/dL — AB (ref 14.1–18.1)
Lymph, poc: 2.3 (ref 0.6–3.4)
MCH: 29.1 pg (ref 27–31.2)
MCHC: 35 g/dL (ref 31.8–35.4)
MCV: 83.3 fL (ref 80–97)
MID (CBC): 0.6 (ref 0–0.9)
MPV: 7.1 fL (ref 0–99.8)
PLATELET COUNT, POC: 318 10*3/uL (ref 142–424)
POC Granulocyte: 9.5 — AB (ref 2–6.9)
POC LYMPH PERCENT: 18.6 %L (ref 10–50)
POC MID %: 4.9 %M (ref 0–12)
RBC: 4.43 M/uL — AB (ref 4.69–6.13)
RDW, POC: 13.6 %
WBC: 12.4 10*3/uL — AB (ref 4.6–10.2)

## 2016-11-07 LAB — POCT SEDIMENTATION RATE: POCT SED RATE: 30 mm/h — AB (ref 0–22)

## 2016-11-07 MED ORDER — DOXYCYCLINE HYCLATE 100 MG PO CAPS: 100.0000 mg | ORAL_CAPSULE | Freq: Two times a day (BID) | ORAL | 0 refills | Status: DC

## 2016-11-07 MED ORDER — INDOMETHACIN 50 MG PO CAPS: 50.0000 mg | ORAL_CAPSULE | Freq: Three times a day (TID) | ORAL | 0 refills | Status: DC

## 2016-11-07 NOTE — Patient Instructions (Addendum)
IF you received an x-ray today, you will receive an invoice from [facility]. Please contact [facility] at [phone] with questions or concerns regarding your invoice.   IF you received labwork today, you will receive an invoice from [facility]. Please contact [facility] at [phone] with questions or concerns regarding your invoice.   Our billing staff will not be able to assist you with questions regarding bills from these companies.  You will be contacted with the lab results as soon as they are available. The fastest way to get your results is to activate your My Chart account. Instructions are located on the last page of this paperwork. If you have not heard from us regarding the results in 2 weeks, please contact this office.    Bone and Joint Infections, Adult Bone infections (osteomyelitis) and joint infections (septic arthritis) occur when bacteria or other germs get inside a bone or a joint. This can happen if you have an infection in another part of your body that spreads through your blood. Germs from your skin or from outside of your body can also cause this type of infection if you have a wound or a broken bone (fracture) that breaks the skin. Anyone can get a bone infection or joint infection. You may be more likely to get this type of infection if you have a condition, such as diabetes, that lowers your ability to fight infection or increases your chances of getting an infection. Bone and joint infections can cause damage, and they can spread to other areas of your body. They need to be treated quickly. CAUSES Most bone and joint infections are caused by bacteria. They can also be caused by other germs, such as viruses and funguses. RISK FACTORS This condition is more likely to develop in:  People who recently had surgery, especially bone or joint surgery.  People who have a long-term (chronic) disease, such as:  HIV  (human immunodeficiency virus).  Diabetes.  Rheumatoid arthritis.  Sickle cell anemia.  Elderly people.  People who take medicines that block or weaken the body's defense system (immune system).  People who have a condition that reduces their blood flow.  People who are on kidney dialysis.  People who have an artificial joint.  People who have had a joint or bone repaired with plates or screws (surgical hardware).  People who use or abuse IV drugs.  People who have had trauma, such as stepping on a nail. SYMPTOMS Symptoms vary depending on the type and location of your infection. Common symptoms of bone and joint infections include:  Fever and chills.  Redness and warmth.  Swelling.  Pain and stiffness.  Drainage of fluid or pus near the infection.  Weight loss and fatigue.  Decreased ability to use a hand or foot. DIAGNOSIS This condition may be diagnosed based on symptoms, medical history, a physical exam, and diagnostic tests. Tests can help to identify the cause of the infection. You may have various tests, such as:  A sample of tissue, fluid, or blood taken to be examined under a microscope.  A procedure to remove fluid from the infected joint with a needle (joint aspiration) for testing in a lab.  Pus or discharge swabbed from a wound for testing to identify germs and to determine what type of medicine will kill them (culture and sensitivity).  Blood tests to look for evidence of infection and inflammation (biomarkers).  Imaging studies to determine how severe the bone  or joint infection is. These may include:  X-rays.  CT scan.  MRI.  Bone scan. TREATMENT Treatment depends on the cause and type of infection. Antibiotic medicines are usually the first treatment for a bone or joint infection. Treatment with antibiotics may include:  Getting IV antibiotics. This may be done in a hospital at first. You may have to continue IV antibiotics at home for  several weeks. You may also have to take antibiotics by mouth for several weeks after that.  Taking more than one kind of antibiotic. Treatment may start with a type of antibiotic that works against many different bacteria (broad spectrumantibiotics). IV antibiotics may be changed if tests show that another type may work better. Other treatments may include:  Draining fluid from the joint by placing a needle into it (aspiration).  Surgery to remove:  Dead or dying tissue from a bone or joint.  An infected artificial joint.  Infected plates or screws that were used to repair a broken bone. HOME CARE INSTRUCTIONS  Take medicines only as directed by your health care provider.  Take your antibiotic medicine as directed by your health care provider. Finish the antibiotic even if you start to feel better.  Follow instructions from your health care provider about how to take IV antibiotics at home.  Ask your health care provider if you have any restrictions on your activities.  Keep all follow-up visits as directed by your health care provider. This is important. SEEK MEDICAL CARE IF:  You have a fever or chills.  You have redness, warmth, pain, or swelling that returns after treatment. SEEK IMMEDIATE MEDICAL CARE IF:  You have rapid breathing or you have trouble breathing.  You have chest pain.  You cannot drink fluids or make urine.  The affected arm or leg swells, changes color, or turns blue.   This information is not intended to replace advice given to you by your health care provider. Make sure you discuss any questions you have with your health care provider.   Document Released: 12/15/2005 Document Revised: 05/01/2015 Document Reviewed: 12/13/2014 [employer] Interactive Patient Education [employer]2016 [employer]

## 2016-11-07 NOTE — Progress Notes (Signed)
Subjective:  By signing my name below, I, Essence Howell, attest that this documentation has been prepared under the direction and in the presence of Delman Cheadle, MD Electronically Signed: Ladene Artist, ED Scribe 11/07/2016 at 4:10 PM.   Patient ID: Kristopher Pen., male    DOB: 01/18/1961, 55 y.o.   MRN: [id number]  Chief Complaint  Patient presents with  . Elbow Pain    left for 3 days   HPI HPI Comments: Kristopher RANSFORD Sr. is a 55 y.o. male, with a h/o arthritis, who presents to the [facility] complaining of left elbow pain for 3 days. Pt was seen in the ED in February 2017 and diagnosed with left lateral epicondylitis. Bursa was tacked under US guidance, cloudy joint fluid removed but it clotted before the lab could run. Pt had mild elevated leukocytes of 12, normal ESR 25, gram stain negative. So Dr. Amedeo Plenty was consulted who consulted Dr. Mardelle Matte who suspected it was inflammatory. Pt was treated with steroids. Uric acid and synovial fluid were not elevated at 7.7 with normal high of 8. No crystals were seen.   Today, pt states that pain feels similar to the pain he experienced 9 months ago. He states that pain is so severe that he is unable to lift a sheet of paper. Pt reports associated symptoms of warmth and joint swelling over the past 3 days. He admits to not following up with Dr. Mardelle Matte following the episode in February. Pt denies h/o gout.   Past Medical History:  Diagnosis Date  . Arthritis   . Hypertension    Current Outpatient Prescriptions on File Prior to Visit  Medication Sig Dispense Refill  . amLODipine-benazepril (LOTREL) 5-20 MG capsule Take 1 capsule by mouth daily. 30 capsule 0  . naproxen sodium (ANAPROX) 220 MG tablet Take 220 mg by mouth 2 (two) times daily as needed (knee pain.).    Marland Kitchen nebivolol (BYSTOLIC) 10 MG tablet Take 10 mg by mouth daily.    . potassium chloride (K-DUR) 10 MEQ tablet Take 10 mEq by mouth daily.    Marland Kitchen  triamterene-hydrochlorothiazide (MAXZIDE-25) 37.5-25 MG per tablet Take 1 tablet by mouth daily.     No current facility-administered medications on file prior to visit.    No Known Allergies   Review of Systems  Musculoskeletal: Positive for arthralgias and joint swelling.   BP 138/80 (BP Location: Right Arm, Patient Position: Sitting, Cuff Size: Small)   Pulse 91   Temp 98.2 F (36.8 C) (Oral)   Resp 18   Ht _0  (1.88 m)   Wt 278 lb (126.1 kg)   SpO2 96%   BMI 35.69 kg/m     Objective:   Physical Exam  Constitutional: He is oriented to person, place, and time. He appears well-developed and well-nourished. No distress.  HENT:  Head: Normocephalic and atraumatic.  Eyes: Conjunctivae and EOM are normal.  Neck: Neck supple. No tracheal deviation present.  Cardiovascular: Normal rate.   Pulmonary/Chest: Effort normal. No respiratory distress.  Musculoskeletal: Normal range of motion.  Entire left elbow is swollen and held at 90 degrees. Decreased ROM. Mild erythema but significant warmth throughout  Neurological: He is alert and oriented to person, place, and time.  Skin: Skin is warm and dry.  Psychiatric: He has a normal mood and affect. His behavior is normal.  Nursing note and vitals reviewed.  Results for orders placed or performed in visit on 11/07/16  POCT CBC  Result Value Ref Range   WBC 12.4 (A) 4.6 - 10.2 K/uL   Lymph, poc 2.3 0.6 - 3.4   POC LYMPH PERCENT 18.6 10 - 50 %L   MID (cbc) 0.6 0 - 0.9   POC MID % 4.9 0 - 12 %M   POC Granulocyte 9.5 (A) 2 - 6.9   Granulocyte percent 76.5 37 - 80 %G   RBC 4.43 (A) 4.69 - 6.13 M/uL   Hemoglobin 12.9 (A) 14.1 - 18.1 g/dL   HCT, POC 36.9 (A) 43.5 - 53.7 %   MCV 83.3 80 - 97 fL   MCH, POC 29.1 27 - 31.2 pg   MCHC 35.0 31.8 - 35.4 g/dL   RDW, POC 13.6 %   Platelet Count, POC 318 142 - 424 K/uL   MPV 7.1 0 - 99.8 fL   Dg Elbow 2 Views Left  Result Date: 11/07/2016 CLINICAL DATA:  Pain and swelling for 4 days  EXAM: LEFT ELBOW - 2 VIEW COMPARISON:  February 10, 2016 FINDINGS: Frontal and lateral views were obtained. There is diffuse soft tissue swelling. There is no evident fracture or dislocation. No joint effusion. There is moderate joint space narrowing. No erosive change evident. IMPRESSION: Moderate osteoarthritic change. Soft tissue swelling. No fracture or dislocation. Electronically Signed   By: Lowella Grip III M.D.   On: 11/07/2016 17:05   Ct Head Wo Contrast  Result Date: 10/24/2016 CLINICAL DATA:  Right parietal impact with piece of falling metal from scaffolding. EXAM: CT HEAD WITHOUT CONTRAST TECHNIQUE: Contiguous axial images were obtained from the base of the skull through the vertex without intravenous contrast. COMPARISON:  None. FINDINGS: Brain: No mass lesion, intraparenchymal hemorrhage or extra-axial collection. No evidence of acute cortical infarct. Brain parenchyma and CSF-containing spaces are normal for age. Vascular: No hyperdense vessel or unexpected calcification. Skull: Normal visualized skull base, calvarium and extracranial soft tissues. Sinuses/Orbits: No sinus fluid levels or advanced mucosal thickening. No mastoid effusion. Normal orbits. IMPRESSION: No acute intracranial abnormality. Electronically Signed   By: Ulyses Jarred M.D.   On: 10/24/2016 19:09       Assessment & Plan:   1. Inflammation of left elbow   Suspect cellulitis but could be gout so start doxycycline with scheduled indomethacin.  Orders Placed This Encounter  Procedures  . DG Elbow 2 Views Left    Standing Status:   Future    Number of Occurrences:   1    Standing Expiration Date:   11/07/2017    Order Specific Question:   Reason for Exam (SYMPTOM  OR DIAGNOSIS REQUIRED)    Answer:   effusion and warmth, concern for septic arthritis    Order Specific Question:   Preferred imaging location?    Answer:   External  . Uric acid  . Ambulatory referral to Orthopedic Surgery    Referral Priority:    Urgent    Referral Type:   Surgical    Referral Reason:   Specialty Services Required    Requested Specialty:   Orthopedic Surgery    Number of Visits Requested:   1  . POCT SEDIMENTATION RATE  . POCT CBC    Meds ordered this encounter  Medications  . indomethacin (INDOCIN) 50 MG capsule    Sig: Take 1 capsule (50 mg total) by mouth 3 (three) times daily with meals.    Dispense:  60 capsule    Refill:  0  . doxycycline (VIBRAMYCIN) 100 MG capsule    Sig:  Take 1 capsule (100 mg total) by mouth 2 (two) times daily.    Dispense:  14 capsule    Refill:  0    I personally performed the services described in this documentation, which was scribed in my presence. The recorded information has been reviewed and considered, and addended by me as needed.   Delman Cheadle, M.D.  [facility] [address] [place], [place] [address] [phone] phone [phone] fax  11/16/16 1:51 AM

## 2016-11-08 LAB — URIC ACID: Uric Acid, Serum: 9.5 mg/dL — ABNORMAL HIGH (ref 4.0–8.0)

## 2016-11-23 ENCOUNTER — Other Ambulatory Visit: Payer: Self-pay | Admitting: Family Medicine

## 2016-11-27 ENCOUNTER — Encounter (INDEPENDENT_AMBULATORY_CARE_PROVIDER_SITE_OTHER): Payer: Self-pay | Admitting: Orthopedic Surgery

## 2016-11-27 ENCOUNTER — Ambulatory Visit (INDEPENDENT_AMBULATORY_CARE_PROVIDER_SITE_OTHER): Payer: BLUE CROSS/BLUE SHIELD | Admitting: Orthopedic Surgery

## 2016-11-27 DIAGNOSIS — M25522 Pain in left elbow: Secondary | ICD-10-CM

## 2016-11-27 DIAGNOSIS — M1712 Unilateral primary osteoarthritis, left knee: Secondary | ICD-10-CM

## 2016-11-27 MED ORDER — LIDOCAINE HCL 1 % IJ SOLN
5.0000 mL | INTRAMUSCULAR | Status: AC | PRN
  Administered 2016-11-27: 5 mL

## 2016-11-27 NOTE — Progress Notes (Signed)
Office Visit Note   Patient: Kristopher McalpineVinson W Bonus Sr.           Date of Birth: [birth year]           MRN: [id number] Visit Date: 11/27/2016 Requested by: Sherren MochaEva N Shaw, MD [address] [place], [place] [id number] PCP: No primary care provider on file.  Subjective: Chief Complaint  Patient presents with  . Left Elbow - Pain    HPI Kristopher [name] is a 55 year old patient with left elbow pain.  He is left-hand dominant and works at [employer] as a [employer]ic.  Been bothering him since 11/04/2016.  He was seen at [facility] emergency room 02/18/2016 and had ultrasound aspiration done at that time.  Culture grew no growth.  He did feel better after this and did not follow-up until he states that it is less than when he went to the urgent care 11/07/2016.  This placed on doxycycline for a month but it's bothering his stomach.  Outside radiographs of the elbow on the left-hand side are reviewed and it does show a general changes in the joint but no evidence of chronic infection at the olecranon tip these were from 11/07/2016  Patient also describes bilateral knee pain left worse than right.  He is seeing another physician in town who states that he has bone-on-bone arthritis and recommended surgery.  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  He does report having an old basketball injury in the 1380s              Review of Systems All systems reviewed are negative as they relate to the chief complaint within the history of present illness.  Patient denies  fevers or chills.    Assessment & Plan: Visit Diagnoses:  1. Left elbow pain     Plan: Impression is left elbow olecranon bursitis which does not appear to be infected.  This is a chronic problem.  I discussed with Oswaldo DoneVincent several options for treatment of the left elbow olecranon bursitis.  One would be anti-inflammatories and compression.  Second would be aspiration although there is a high likelihood that the fluid would recur.  Third option  if the elbow is really bothering him would be surgical removal of the bursa.  At this time he wants to try compression.  I also think that it would be reasonable since he has been on antibiotics for 2 weeks to come off of the antibiotics.  If he develops sudden increase in pain or size of that olecranon bursa that he is encouraged to come back in for aspiration and possible bursal removal.  I also provided him with some Aleve samples to take for a week to see if I can also help to decrease the swelling.  Would want him to take 1 in the morning and 1 at night  Patient also describes left knee pain and has by report bone-on-bone arthritis particularly on the left.  He does have an effusion on that side and thus we will aspirate that today.  Did get about 60 mL of serous fluid.  Did not inject the knee but consideration could be given towards a every 3 month injection/aspiration schedule for the patient.  For his knee arthritis  Follow-Up Instructions: No Follow-up on file.   Orders:  No orders of the defined types were placed in this encounter.  No orders of the defined types were placed in this encounter.     Procedures: Large Joint Inj Date/Time:  11/27/2016 10:20 AM Performed by: Cammy CopaEAN, SCOTT Jassiah Viviano Authorized by: Cammy CopaEAN, SCOTT Vernice Mannina   Consent Given by:  Patient Site marked: the procedure site was marked   Timeout: prior to procedure the correct patient, procedure, and site was verified   Indications:  Pain, joint swelling and diagnostic evaluation Location:  Knee Site:  L knee Prep: patient was prepped and draped in usual sterile fashion   Needle Size:  18 G Needle Length:  1.5 inches Approach:  Superolateral Ultrasound Guidance: No   Fluoroscopic Guidance: No   Arthrogram: No   Medications:  5 mL lidocaine 1 % Aspiration Attempted: Yes   Aspirate amount (mL):  60 Aspirate:  Serous Patient tolerance:  Patient tolerated the procedure well with no immediate  complications     Clinical Data: No additional findings.  Objective: Vital Signs: There were no vitals taken for this visit.  Physical Exam  Constitutional: He appears well-developed.  HENT:  Head: Normocephalic.  Eyes: EOM are normal.  Neck: Normal range of motion.  Cardiovascular: Normal rate.   Pulmonary/Chest: Effort normal.  Neurological: He is alert.  Skin: Skin is warm.  Psychiatric: He has a normal mood and affect.    Ortho Exam examination of the left elbow demonstrates call Paul sized area of olecranon bursitis.  Elbow range of motion otherwise is intact.  This area itself is not tender and there is no surrounding erythema.  There is no proximal lymphadenopathy.  Motor sensory function to the hand is intact.  Bilateral knees are also examined.  He has about a 5-7 flexion contracture in each knee.  Effusion is present in both.  Collaterals are stable.  He has a little bit more anterior laxity on the left knee compared to the right.  Extensor mechanism is intact.  Pedal pulses are intact.  Specialty Comments:  No specialty comments available.  Imaging: No results found.   PMFS History: There are no active problems to display for this patient.  Past Medical History:  Diagnosis Date  . Arthritis   . Hypertension     Family History  Problem Relation Age of Onset  . Cancer Mother   . Cancer Father   . Cancer Sister   . Hypertension Sister   . Cancer Brother   . Hypertension Brother   . Cancer Sister   . Hypertension Sister     Past Surgical History:  Procedure Laterality Date  . HAND SURGERY     Social History   Occupational History  . Not on file.   Social History Main Topics  . Smoking status: Never Smoker  . Smokeless tobacco: Never Used  . Alcohol use Yes     Comment: occasional  . Drug use: No  . Sexual activity: Not on file

## 2016-11-28 DIAGNOSIS — Z0000 Encounter for general adult medical examination without abnormal findings: Secondary | ICD-10-CM | POA: Diagnosis not present

## 2017-01-01 ENCOUNTER — Ambulatory Visit (INDEPENDENT_AMBULATORY_CARE_PROVIDER_SITE_OTHER): Payer: Self-pay

## 2017-01-01 ENCOUNTER — Ambulatory Visit (INDEPENDENT_AMBULATORY_CARE_PROVIDER_SITE_OTHER): Payer: BLUE CROSS/BLUE SHIELD | Admitting: Family

## 2017-01-01 ENCOUNTER — Encounter (INDEPENDENT_AMBULATORY_CARE_PROVIDER_SITE_OTHER): Payer: Self-pay

## 2017-01-01 DIAGNOSIS — M1712 Unilateral primary osteoarthritis, left knee: Secondary | ICD-10-CM

## 2017-01-01 DIAGNOSIS — M25571 Pain in right ankle and joints of right foot: Secondary | ICD-10-CM | POA: Diagnosis not present

## 2017-01-01 DIAGNOSIS — M170 Bilateral primary osteoarthritis of knee: Secondary | ICD-10-CM

## 2017-01-01 DIAGNOSIS — M1711 Unilateral primary osteoarthritis, right knee: Secondary | ICD-10-CM

## 2017-01-01 DIAGNOSIS — S93401A Sprain of unspecified ligament of right ankle, initial encounter: Secondary | ICD-10-CM | POA: Diagnosis not present

## 2017-01-01 MED ORDER — LIDOCAINE HCL 1 % IJ SOLN
5.0000 mL | INTRAMUSCULAR | Status: AC | PRN
  Administered 2017-01-01: 5 mL

## 2017-01-01 MED ORDER — METHYLPREDNISOLONE ACETATE 40 MG/ML IJ SUSP
40.0000 mg | INTRAMUSCULAR | Status: AC | PRN
  Administered 2017-01-01: 40 mg via INTRA_ARTICULAR

## 2017-01-01 NOTE — Progress Notes (Signed)
Office Visit Note   Patient: Kristopher SCHNEPF Sr.           Date of Birth: 11-19-1961           MRN: [id number] Visit Date: 01/01/2017              Requested by: No referring provider defined for this encounter. PCP: Pcp Not In System  Chief Complaint  Patient presents with  . Right Ankle - Pain  . Left Knee - Pain  . Right Knee - Pain    HPI: Patient is a 56 year old gentleman who presents today for 2 separate issues.  He is complaining of ankle pain. He has onset of pain two days ago. He has been doing a lot of walking for work, going up stairs. He cannot recall a specific injury. He may have twisted it. He complains swelling, pain medially and laterally. Patient is taking aleve currently for his pain without relief. Pain is constant, worse with weightbearing.  Also complaining of bilateral knee pain. This has been ongoing for many years. Reports a history of cortisone injections for arthritis that have worked well. Last injections were 8 months ago. Requesting repeat injection today. States he has been seen by Dr. August Saucer for this in the past.    Assessment & Plan: Visit Diagnoses:  1. Pain in right ankle and joints of right foot   2. Primary osteoarthritis of both knees     Plan: We'll follow up in 3-4 weeks if having continued ankle pain. Will follow up as needed for bilateral knees with Dr. August Saucer.  Follow-Up Instructions: Return in about 3 weeks (around 01/22/2017).   Patient is alert and oriented. No adenopathy. Well-dressed. Normal affect. Respirations easy. Steady gait.    Right Ankle Exam  Swelling: moderate  Tenderness  The patient is experiencing tenderness in the ATF, CF and deltoid.    Tests  Anterior drawer: negative Other  Erythema: absent Pulse: present    Right Knee Exam   Tenderness  The patient is experiencing no tenderness.     Muscle Strength   The patient has normal right knee strength.  Tests  Varus: negative Valgus:  negative  Other  Swelling: none   Left Knee Exam   Tenderness  The patient is experiencing tenderness in the medial joint line and lateral joint line.  Muscle Strength   The patient has normal left knee strength.  Tests  Varus: negative Valgus: negative  Other  Swelling: mild      Imaging: Xr Knee 1-2 Views Left  Result Date: 01/01/2017 Grafts the left knee show tricompartmental end-stage osteoarthritis.  Xr Ankle Complete Right  Result Date: 01/01/2017 3 views of the right ankle are negative for fracture. No acute bony abnormalities.  Xr Knee 1-2 Views Right  Result Date: 01/01/2017 Radiographs of the right knee show end-stage tricompartmental osteoarthritis with bony spurring   Orders:  Orders Placed This Encounter  Procedures  . Large Joint Injection/Arthrocentesis  . Large Joint Injection/Arthrocentesis  . XR Ankle Complete Right  . XR Knee 1-2 Views Right  . XR Knee 1-2 Views Left   No orders of the defined types were placed in this encounter.    Procedures: Large Joint Inj Date/Time: 01/01/2017 3:58 PM Performed by: Adonis Huguenin Authorized by: Barnie Del R   Consent Given by:  Patient Site marked: the procedure site was marked   Timeout: prior to procedure the correct patient, procedure, and site was verified   Indications:  Pain and diagnostic evaluation Location:  Knee Site:  R knee Needle Size:  22 G Needle Length:  1.5 inches Ultrasound Guidance: No   Fluoroscopic Guidance: No   Arthrogram: No   Medications:  5 mL lidocaine 1 %; 40 mg methylPREDNISolone acetate 40 MG/ML Aspiration Attempted: No   Patient tolerance:  Patient tolerated the procedure well with no immediate complications Large Joint Inj Date/Time: 01/01/2017 3:58 PM Performed by: Adonis HugueninZAMORA, ERIN R Authorized by: Barnie DelZAMORA, ERIN R   Consent Given by:  Patient Site marked: the procedure site was marked   Timeout: prior to procedure the correct patient, procedure, and site was  verified   Indications:  Pain and diagnostic evaluation Location:  Knee Site:  L knee Needle Size:  22 G Needle Length:  1.5 inches Ultrasound Guidance: No   Fluoroscopic Guidance: No   Arthrogram: No   Medications:  5 mL lidocaine 1 %; 40 mg methylPREDNISolone acetate 40 MG/ML Aspiration Attempted: No   Patient tolerance:  Patient tolerated the procedure well with no immediate complications    Clinical Data: No additional findings.  Subjective: Review of Systems  Constitutional: Negative for chills and fever.  Musculoskeletal: Positive for arthralgias, gait problem and joint swelling.    Objective: Vital Signs: There were no vitals taken for this visit.  Specialty Comments:  No specialty comments available.  PMFS History: There are no active problems to display for this patient.  Past Medical History:  Diagnosis Date  . Arthritis   . Hypertension     Family History  Problem Relation Age of Onset  . Cancer Mother   . Cancer Father   . Cancer Sister   . Hypertension Sister   . Cancer Brother   . Hypertension Brother   . Cancer Sister   . Hypertension Sister     Past Surgical History:  Procedure Laterality Date  . HAND SURGERY     Social History   Occupational History  . Not on file.   Social History Main Topics  . Smoking status: Never Smoker  . Smokeless tobacco: Never Used  . Alcohol use Yes     Comment: occasional  . Drug use: No  . Sexual activity: Not on file

## 2017-01-21 ENCOUNTER — Ambulatory Visit (INDEPENDENT_AMBULATORY_CARE_PROVIDER_SITE_OTHER): Payer: BLUE CROSS/BLUE SHIELD | Admitting: Family

## 2017-01-22 ENCOUNTER — Ambulatory Visit (INDEPENDENT_AMBULATORY_CARE_PROVIDER_SITE_OTHER): Payer: BLUE CROSS/BLUE SHIELD | Admitting: Family

## 2017-01-22 ENCOUNTER — Encounter (INDEPENDENT_AMBULATORY_CARE_PROVIDER_SITE_OTHER): Payer: Self-pay | Admitting: Family

## 2017-01-22 VITALS — Ht 74.0 in | Wt 278.0 lb

## 2017-01-22 DIAGNOSIS — M1711 Unilateral primary osteoarthritis, right knee: Secondary | ICD-10-CM

## 2017-01-22 DIAGNOSIS — M25571 Pain in right ankle and joints of right foot: Secondary | ICD-10-CM | POA: Diagnosis not present

## 2017-01-22 NOTE — Progress Notes (Signed)
Office Visit Note   Patient: Kristopher OBRYANT Sr.           Date of Birth: 1961/09/06           MRN: [id number] Visit Date: 01/22/2017              Requested by: No referring provider defined for this encounter. PCP: Pcp Not In System  Chief Complaint  Patient presents with  . Right Knee - Follow-up    HPI: Patient presents today for follow up right knee. He is status post injection 01/01/17 which provided him with temporarily relief. He does report swelling. He states he could hardly work yesterday due to swelling, and decreased range of motion.   The patient is a 56 year old gentleman who is seen today in follow-up for right knee pain. He has osteoarthritis bilateral knees. He is status post injection of cortisone 3 weeks ago which provided him temporary relief. Is now complaining of some swelling to the right knee. Complains that he had difficulty work yesterday due to swelling which caused decreased range of motion. Having pain, getting comfortable in bed at night.  Length of continued left knee pain however this has not been as bad as the right of late. Is status post injection to the right ankle at last visit. States this was very helpful. Denies any ankle pain or trouble with ambulation today.    Assessment & Plan: Visit Diagnoses:  1. Pain in right ankle and joints of right foot   2. Primary osteoarthritis of right knee     Plan: Ankle pain is resolved.  Knee:Discussed conservative treatment options at length. Patient would like to continue with conservative management at present. Will consider TKA in long term. Will begin taking Aleve q am. Discussed could take bid and increase to 2 tabs as needed. May consider repeat steriod injections in 2 more months.   Follow-Up Instructions: Return if symptoms worsen or fail to improve.   Physical Exam  Constitutional: Appears well-developed.  Head: Normocephalic.  Eyes: EOM are normal.  Neck: Normal range of motion.    Cardiovascular: Normal rate.   Pulmonary/Chest: Effort normal.  Neurological: Is alert.  Skin: Skin is warm.  Psychiatric: Has a normal mood and affect.  Right Knee Exam   Tenderness  The patient is experiencing no tenderness.     Range of Motion  The patient has normal right knee ROM.  Tests  Varus: negative Valgus: negative  Other  Swelling: mild Other tests: effusion present       Imaging: No results found.  Orders:  No orders of the defined types were placed in this encounter.  No orders of the defined types were placed in this encounter.    Procedures: No procedures performed  Clinical Data: No additional findings.  Subjective: Review of Systems  Objective: Vital Signs: Ht 6\' 2"  (1.88 m)   Wt 278 lb (126.1 kg)   BMI 35.69 kg/m   Specialty Comments:  No specialty comments available.  PMFS History: There are no active problems to display for this patient.  Past Medical History:  Diagnosis Date  . Arthritis   . Hypertension     Family History  Problem Relation Age of Onset  . Cancer Mother   . Cancer Father   . Cancer Sister   . Hypertension Sister   . Cancer Brother   . Hypertension Brother   . Cancer Sister   . Hypertension Sister     Past Surgical History:  Procedure Laterality Date  . HAND SURGERY     Social History   Occupational History  . Not on file.   Social History Main Topics  . Smoking status: Never Smoker  . Smokeless tobacco: Never Used  . Alcohol use Yes     Comment: occasional  . Drug use: No  . Sexual activity: Not on file

## 2017-03-13 DIAGNOSIS — E786 Lipoprotein deficiency: Secondary | ICD-10-CM | POA: Diagnosis not present

## 2017-03-13 DIAGNOSIS — I10 Essential (primary) hypertension: Secondary | ICD-10-CM | POA: Diagnosis not present

## 2017-03-15 IMAGING — CR DG ELBOW COMPLETE 3+V*L*
4 series · 4 of 4 positions shown · non-contrast
Comparison: August 03, 2015

CLINICAL DATA: Pain and swelling for 2 days

EXAM:
LEFT ELBOW - COMPLETE 3+ VIEW

[x elbow ap left]
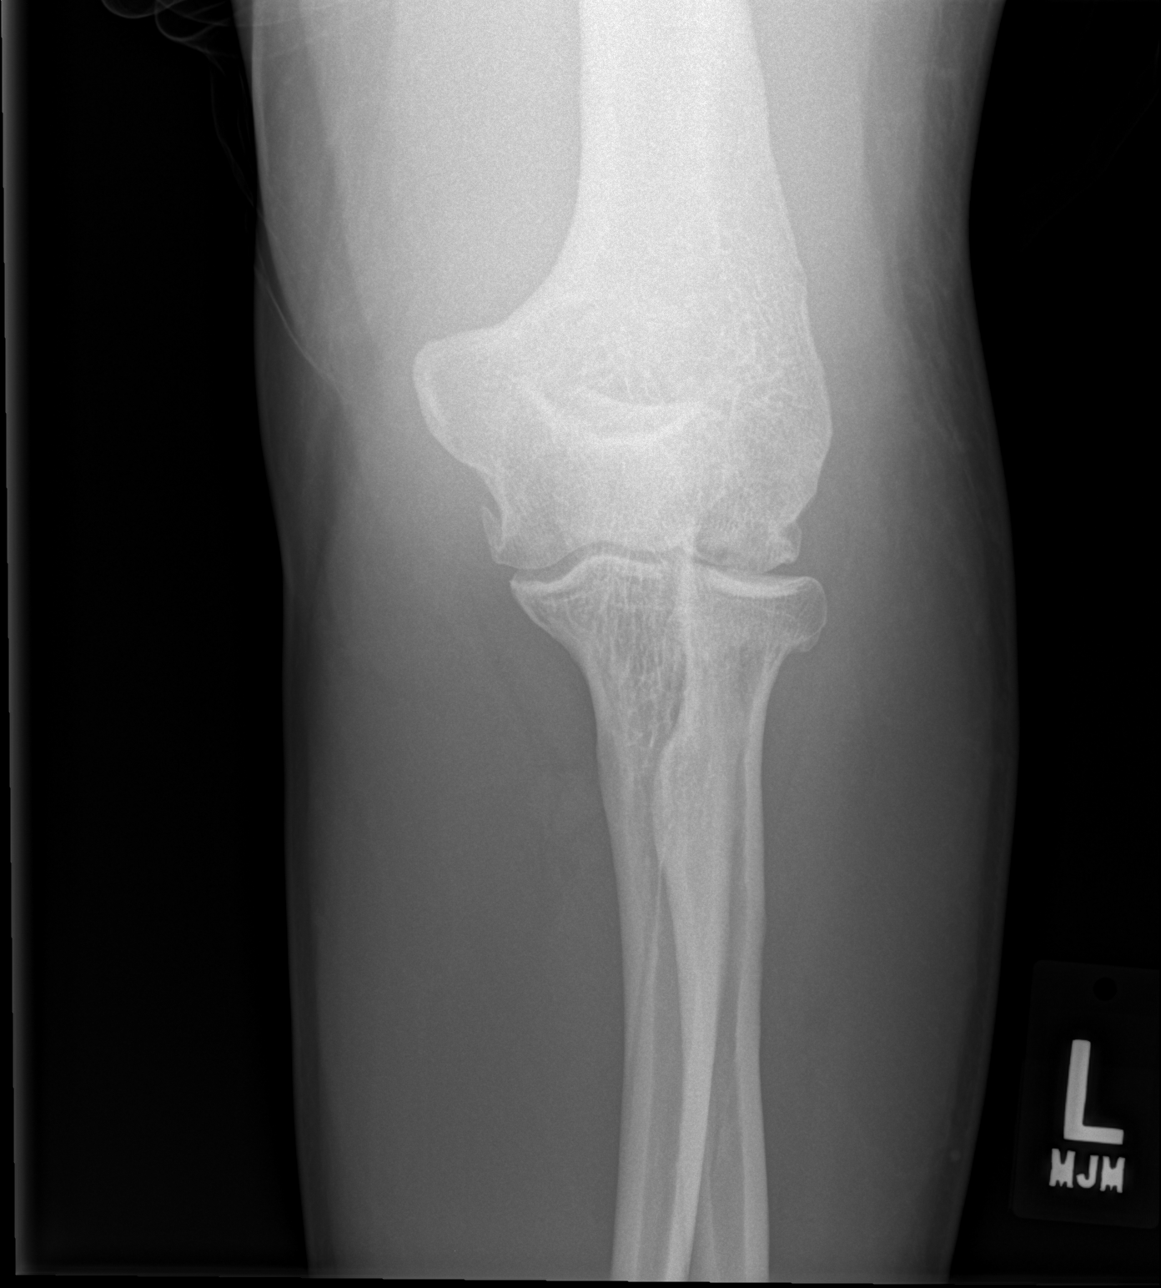

[x elbow obl left (1 of 3)]
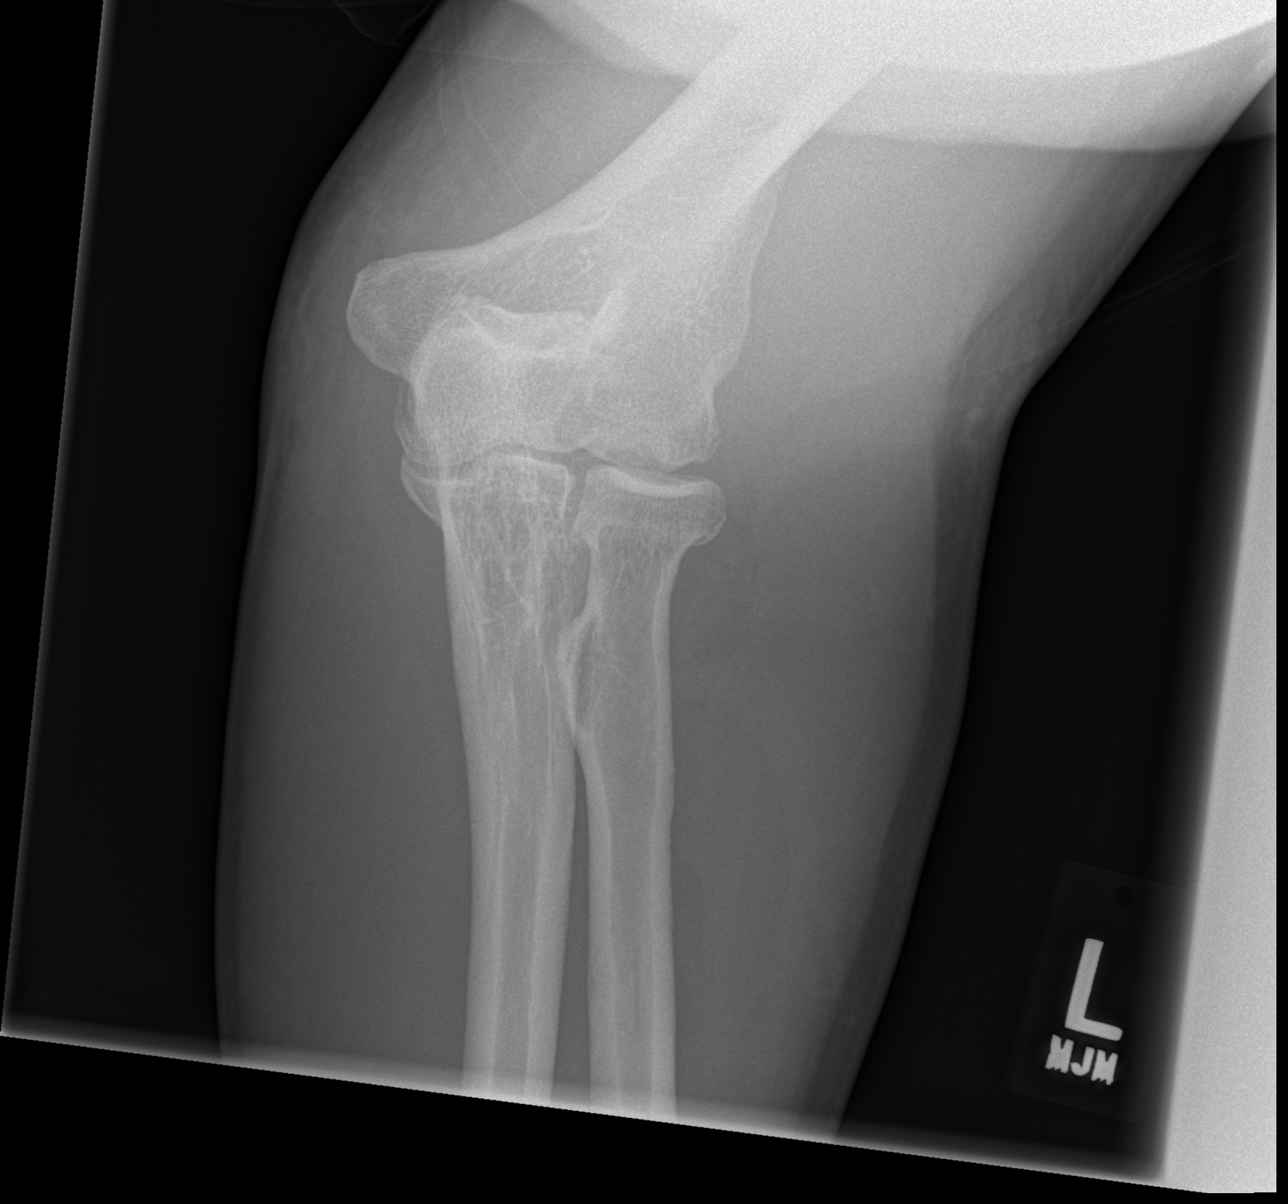

[x elbow obl left (2 of 3)]
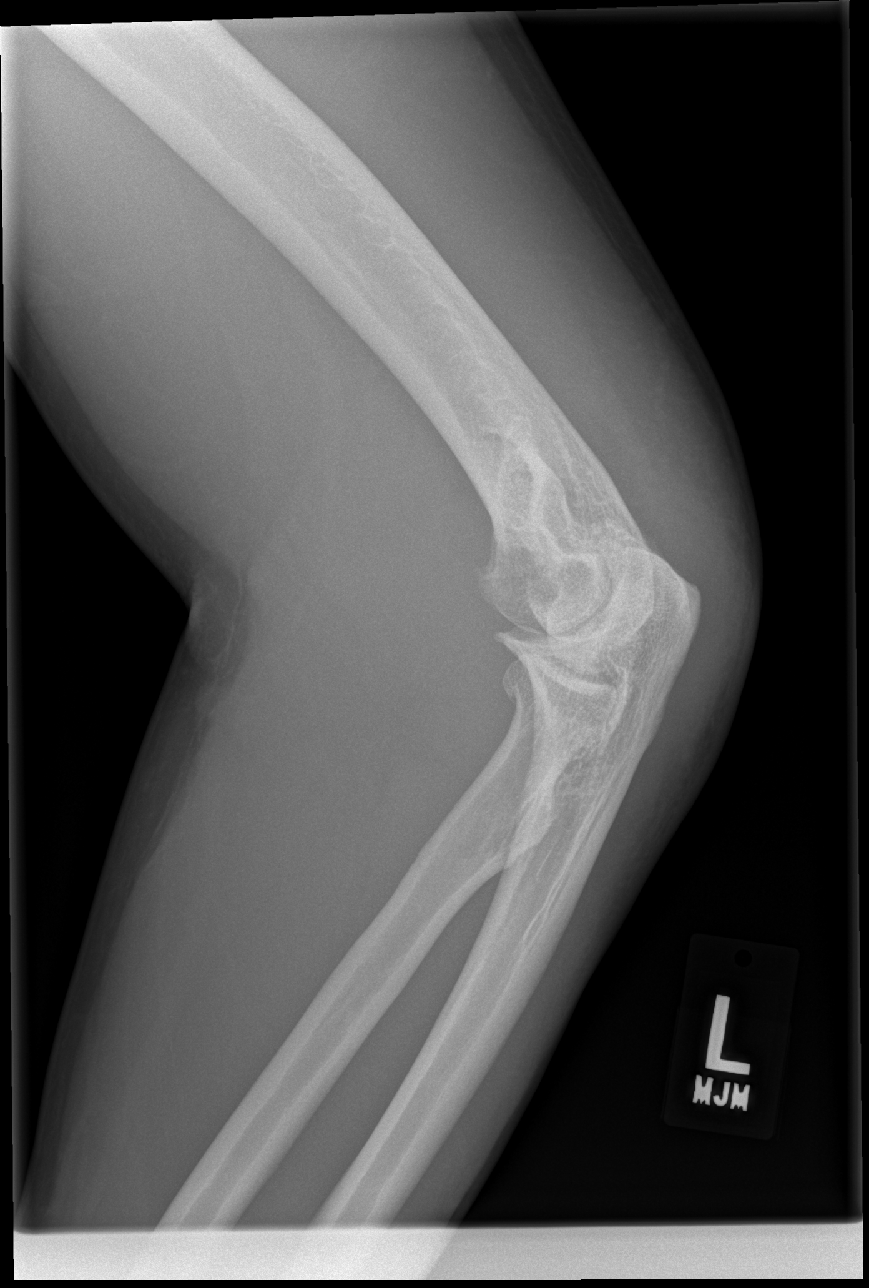

[x elbow obl left (3 of 3)]
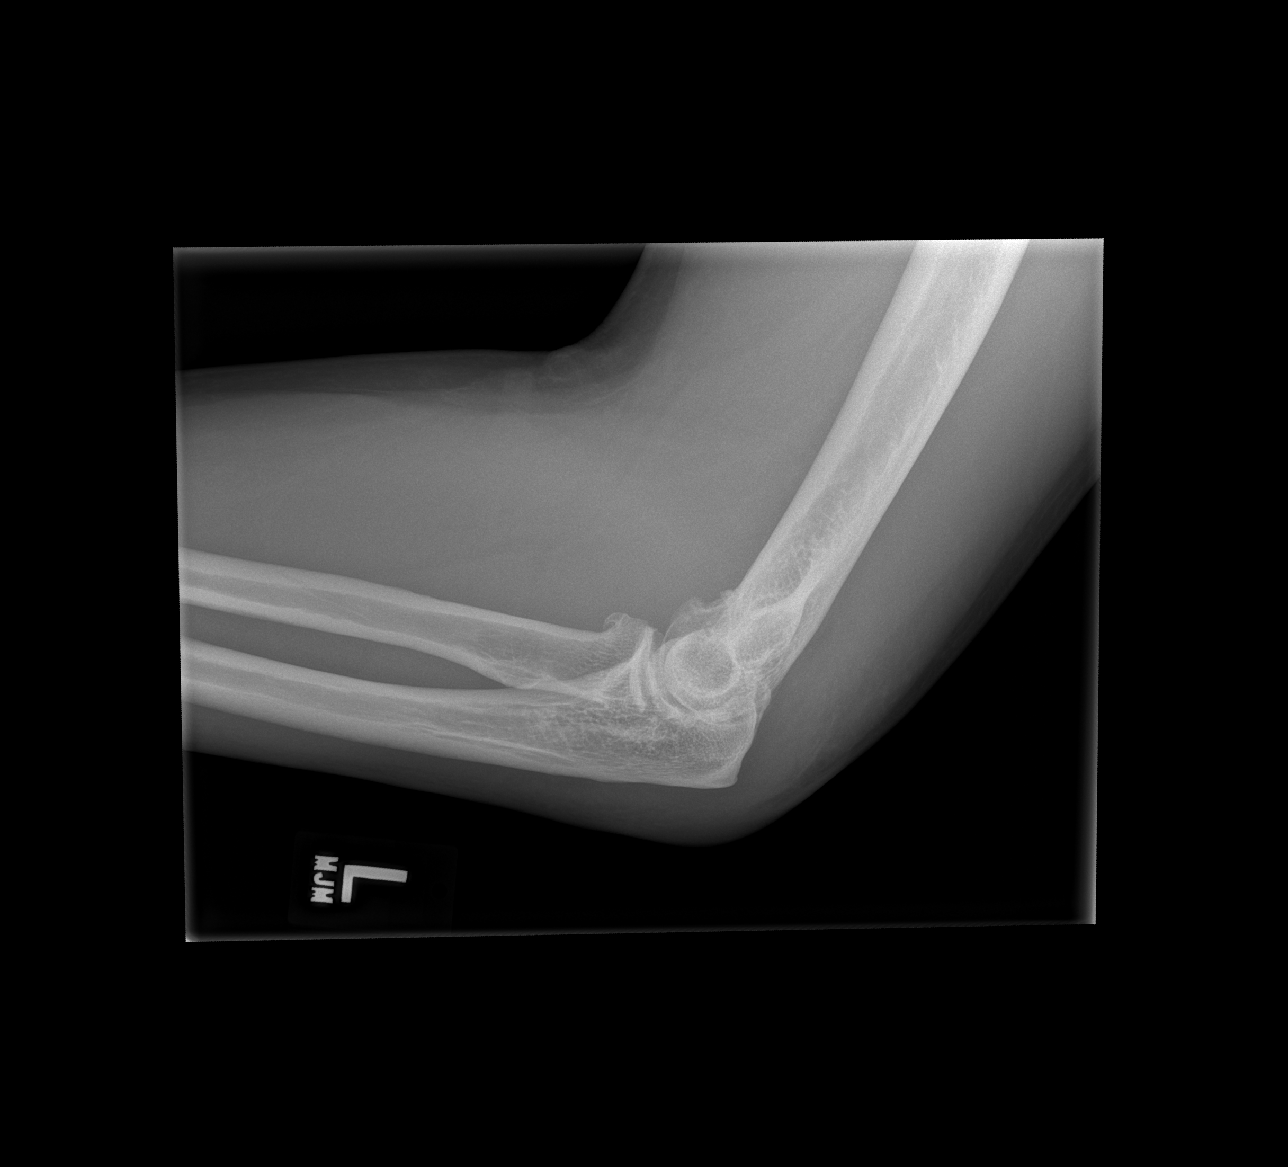

[4 of 4 positions shown; findings below may reference images not displayed]

FINDINGS: Frontal, lateral, and bilateral oblique views were obtained. There
is generalized soft tissue swelling. There is no fracture or
dislocation. There is no appreciable joint effusion. There is
osteoarthritic change in the elbow joint with areas of spurring in
the distal humerus and proximal radial regions. No erosive change.
IMPRESSION: Soft tissue swelling. Osteoarthritic change. No fracture or
dislocation.

## 2017-03-17 DIAGNOSIS — R739 Hyperglycemia, unspecified: Secondary | ICD-10-CM | POA: Diagnosis not present

## 2017-03-17 DIAGNOSIS — Z9189 Other specified personal risk factors, not elsewhere classified: Secondary | ICD-10-CM | POA: Insufficient documentation

## 2017-04-03 ENCOUNTER — Ambulatory Visit (INDEPENDENT_AMBULATORY_CARE_PROVIDER_SITE_OTHER): Payer: BLUE CROSS/BLUE SHIELD | Admitting: Family

## 2017-04-03 ENCOUNTER — Ambulatory Visit (INDEPENDENT_AMBULATORY_CARE_PROVIDER_SITE_OTHER): Payer: Self-pay

## 2017-04-03 ENCOUNTER — Encounter (INDEPENDENT_AMBULATORY_CARE_PROVIDER_SITE_OTHER): Payer: Self-pay | Admitting: Family

## 2017-04-03 VITALS — Ht 74.0 in | Wt 278.0 lb

## 2017-04-03 DIAGNOSIS — M25571 Pain in right ankle and joints of right foot: Secondary | ICD-10-CM

## 2017-04-03 DIAGNOSIS — M1711 Unilateral primary osteoarthritis, right knee: Secondary | ICD-10-CM

## 2017-04-03 DIAGNOSIS — M1712 Unilateral primary osteoarthritis, left knee: Secondary | ICD-10-CM | POA: Diagnosis not present

## 2017-04-03 MED ORDER — LIDOCAINE HCL 1 % IJ SOLN
5.0000 mL | INTRAMUSCULAR | Status: AC | PRN
  Administered 2017-04-03: 5 mL

## 2017-04-03 MED ORDER — METHYLPREDNISOLONE ACETATE 40 MG/ML IJ SUSP
40.0000 mg | INTRAMUSCULAR | Status: AC | PRN
  Administered 2017-04-03: 40 mg via INTRA_ARTICULAR

## 2017-04-03 NOTE — Progress Notes (Signed)
Office Visit Note   Patient: Kristopher [name].           Date of Birth: 08/07/1961           MRN: [id number] Visit Date: 04/03/2017              Requested by: No referring provider defined for this encounter. PCP: Pcp Not In System  Chief Complaint  Patient presents with  . Right Ankle - Pain    s/p fall two days ago      HPI: The patient is 56 year old gentleman who presents today for 2 separate issues. He is 3 months out from his last Depo-Medrol injections for bilateral osteoarthritis.  injections have been working well providing good interval relief. Requesting repeat injection today. Denies mechanical symptoms but does endorse global knee pain which is worse with ambulation. The second issue is right foot and ankle swelling. States hospital bed fell on the top of his right foot at work. Has had swelling and pain for the last several days since. Worst pain with ambulation. Today he is not wearing socks with his sneakers so he that he "will have more space in his shoe wear"  Assessment & Plan: Visit Diagnoses:  1. Pain in right ankle and joints of right foot     Plan: Depo-Medrol injections for bilateral knees today. He'll follow up for the knees in office as needed. Have provided reassurance about the foot pain on the right. He may use ice ibuprofen and Ace wrap for comfort and compression follow-up in office as needed.  Follow-Up Instructions: Return if symptoms worsen or fail to improve.   Right Ankle Exam  Right ankle exam is normal.   Right Knee Exam   Tenderness  The patient is experiencing no tenderness.     Range of Motion  The patient has normal right knee ROM.  Tests  Varus: negative Valgus: negative  Other  Swelling: none   Left Knee Exam   Tenderness  The patient is experiencing no tenderness.     Range of Motion  The patient has normal left knee ROM.  Tests  Varus: negative Valgus: negative  Other  Swelling:  none      Patient is alert, oriented, no adenopathy, well-dressed, normal affect, normal respiratory effort. Right foot: dorsal swelling which is mild. Diffuse tenderness over foot. No erythema. No warmth. Full rom.   Imaging: Xr Ankle 2 Views Right  Result Date: 04/03/2017 Radiographs of right ankle are negative. No acute abnormality.   Labs: Lab Results  Component Value Date   ESRSEDRATE 25 (H) 02/10/2016   LABURIC 9.5 (H) 11/07/2016   REPTSTATUS 02/14/2016 FINAL 02/10/2016   GRAMSTAIN  02/10/2016    NO ORGANISMS SEEN WBC PRESENT, PREDOMINANTLY PMN Gram Stain Report Called to,Read Back By and Verified With: S THORNTON AT 1124 ON 02.12.2017 BY NBROOKS    CULT  02/10/2016    NO GROWTH 3 DAYS Performed at [facility]     Orders:  Orders Placed This Encounter  Procedures  . XR Ankle 2 Views Right  . XR Foot Complete Right   No orders of the defined types were placed in this encounter.    Procedures: Large Joint Inj Date/Time: 04/03/2017 2:24 PM Performed by: Adonis Huguenin Authorized by: Barnie Del R   Consent Given by:  Patient Site marked: the procedure site was marked   Timeout: prior to procedure the correct patient, procedure, and site was verified   Indications:  Pain and diagnostic evaluation Location:  Knee Site:  R knee Needle Size:  22 G Needle Length:  1.5 inches Ultrasound Guidance: No   Fluoroscopic Guidance: No   Arthrogram: No   Medications:  5 mL lidocaine 1 %; 40 mg methylPREDNISolone acetate 40 MG/ML Aspiration Attempted: No   Patient tolerance:  Patient tolerated the procedure well with no immediate complications Large Joint Inj Date/Time: 04/03/2017 2:24 PM Performed by: Adonis Huguenin Authorized by: Barnie Del R   Consent Given by:  Patient Site marked: the procedure site was marked   Timeout: prior to procedure the correct patient, procedure, and site was verified   Indications:  Pain and diagnostic evaluation Location:   Knee Site:  L knee Needle Size:  22 G Needle Length:  1.5 inches Ultrasound Guidance: No   Fluoroscopic Guidance: No   Arthrogram: No   Medications:  5 mL lidocaine 1 %; 40 mg methylPREDNISolone acetate 40 MG/ML Aspiration Attempted: No   Patient tolerance:  Patient tolerated the procedure well with no immediate complications    Clinical Data: No additional findings.  ROS:  Review of Systems  Constitutional: Negative for chills and fever.  Cardiovascular: Negative for leg swelling.  Musculoskeletal: Positive for arthralgias, gait problem and joint swelling.    Objective: Vital Signs: Ht  (1.88 m)   Wt 278 lb (126.1 kg)   BMI 35.69 kg/m   Specialty Comments:  No specialty comments available.  PMFS History: There are no active problems to display for this patient.  Past Medical History:  Diagnosis Date  . Arthritis   . Hypertension     Family History  Problem Relation Age of Onset  . Cancer Mother   . Cancer Father   . Cancer Sister   . Hypertension Sister   . Cancer Brother   . Hypertension Brother   . Cancer Sister   . Hypertension Sister     Past Surgical History:  Procedure Laterality Date  . HAND SURGERY     Social History   Occupational History  . Not on file.   Social History Main Topics  . Smoking status: Never Smoker  . Smokeless tobacco: Never Used  . Alcohol use Yes     Comment: occasional  . Drug use: No  . Sexual activity: Not on file

## 2017-04-21 DIAGNOSIS — I159 Secondary hypertension, unspecified: Secondary | ICD-10-CM | POA: Diagnosis not present

## 2017-04-27 ENCOUNTER — Telehealth: Payer: Self-pay | Admitting: Cardiology

## 2017-04-27 NOTE — Telephone Encounter (Signed)
Received records from [facility] at [facility] for appointment on 04/29/17 with Dr Herbie Baltimore.  Records put with Dr Elissa Hefty schedule for 04/29/17. lp

## 2017-04-29 ENCOUNTER — Ambulatory Visit: Payer: BLUE CROSS/BLUE SHIELD | Admitting: Cardiology

## 2017-05-18 ENCOUNTER — Encounter: Payer: Self-pay | Admitting: Cardiology

## 2017-05-18 ENCOUNTER — Ambulatory Visit (INDEPENDENT_AMBULATORY_CARE_PROVIDER_SITE_OTHER): Payer: BLUE CROSS/BLUE SHIELD | Admitting: Cardiology

## 2017-05-18 ENCOUNTER — Encounter (INDEPENDENT_AMBULATORY_CARE_PROVIDER_SITE_OTHER): Payer: Self-pay

## 2017-05-18 VITALS — BP 160/104 | HR 72 | Ht 75.0 in | Wt 254.0 lb

## 2017-05-18 DIAGNOSIS — Z8639 Personal history of other endocrine, nutritional and metabolic disease: Secondary | ICD-10-CM | POA: Diagnosis not present

## 2017-05-18 DIAGNOSIS — R9431 Abnormal electrocardiogram [ECG] [EKG]: Secondary | ICD-10-CM | POA: Diagnosis not present

## 2017-05-18 DIAGNOSIS — I10 Essential (primary) hypertension: Secondary | ICD-10-CM

## 2017-05-18 DIAGNOSIS — Z9189 Other specified personal risk factors, not elsewhere classified: Secondary | ICD-10-CM | POA: Diagnosis not present

## 2017-05-18 DIAGNOSIS — I1A0 Resistant hypertension: Secondary | ICD-10-CM

## 2017-05-18 MED ORDER — BYSTOLIC 20 MG PO TABS
20.0000 mg | ORAL_TABLET | Freq: Every day | ORAL | 11 refills | Status: DC
Start: 2017-05-18 — End: 2017-06-18

## 2017-05-18 MED ORDER — NEBIVOLOL HCL 20 MG PO TABS: 20.0000 mg | ORAL_TABLET | Freq: Every day | ORAL | 0 refills | Status: DC

## 2017-05-18 MED ORDER — VALSARTAN 80 MG PO TABS
80.0000 mg | ORAL_TABLET | Freq: Every day | ORAL | 11 refills | Status: DC
Start: 2017-05-18 — End: 2017-06-18

## 2017-05-18 NOTE — Patient Instructions (Addendum)
MED CATION CHANGE --INCREASE BYSTOLIC TO 20 MG DAILY  START VALSARTAN 80 MG ONE TABLET DAILY   SCHEDULE AT [address] Your physician has requested that you have an echocardiogram. Echocardiography is a painless test that uses sound waves to create images of your heart. It provides your doctor with information about the size and shape of your heart and how well your heart's chambers and valves are working. This procedure takes approximately one hour. There are no restrictions for this procedure.   SCHEDULE AT [address]  [address] Your physician has requested that you have a renal artery duplex. During this test, an ultrasound is used to evaluate blood flow to the kidneys. Allow one hour for this exam. Do not eat after midnight the day before and avoid carbonated beverages. Take your medications as you usually do.   Your physician recommends that you schedule a follow-up appointment in 1 MONTH WITH DR HARDING.

## 2017-05-18 NOTE — Progress Notes (Signed)
PCP: No primary care provider on file.  Clinic Note: Chief Complaint  Patient presents with  . New Evaluation    Resistant hypertension    HPI: Marland McalpineVinson W Bega Sr. is a 56 y.o. male who is being seen today for the evaluation of resistant hypertension at the request of [place], [place], [place] [address] [place]) He reports previously being evaluated by cardiology for hypertension. -- He was previously on Maxide was converted to chlorthalidone plus spironolactone.\- Was concern for hypokalemia.  Recent Hospitalizations: None  Studies Personally Reviewed - (if available, images/films reviewed: From Epic Chart or Care Everywhere)  None Lab Results  Component 02/10/2016  04/21/2017 ([facility])   CREATININE 0.93  1.27   BUN 15 32   NA 138 141    K 3.0 (L) 3.4    CL 102 99    CO2 28 32     Interval History: Mr. Toni AmendCourtney presents today really without any major symptoms. He denies any headache or blurred vision or dizziness associated with his high blood pressure. He states that he has had high blood pressure issues for a long time and pretty much his mother father 3 brothers and 3 sisters all have high blood pressure. He is not sure what all evaluation is been done, but his   Cardiac Review of Symptoms: No chest pain or shortness of breath with rest or exertion. He may get some shortness of breath if he overexerts. No PND, orthopnea or edema. No palpitations, lightheadedness, dizziness, weakness or syncope/near syncope. No TIA/amaurosis fugax symptoms. No claudication.  ROS: A comprehensive was performed. Review of Systems  Constitutional: Negative for chills, fever, malaise/fatigue and weight loss.  HENT: Negative for nosebleeds.   Respiratory: Negative for cough and shortness of breath.   Cardiovascular: Positive for leg swelling (only left knee).  Gastrointestinal: Negative for blood in stool and melena.  Genitourinary: Negative for hematuria.  Musculoskeletal:  Positive for joint pain (Significant arthritis pain in left knee).  Skin: Negative.   Neurological: Positive for headaches (Intermittent headaches depending on if he drinks enough fluids.).  Psychiatric/Behavioral: Negative.   All other systems reviewed and are negative.   I have reviewed and (if needed) personally updated the patient's problem list, medications, allergies, past medical and surgical history, social and family history.   Past Medical History:  Diagnosis Date  . Arthritis   . Hypertension     Past Surgical History:  Procedure Laterality Date  . HAND SURGERY    . KNEE ARTHROSCOPY Left    Current Meds  Medication Sig  . acetaminophen (TYLENOL) 325 MG tablet Take 650 mg by mouth every 6 (six) hours as needed.  Marland Kitchen. atorvastatin (LIPITOR) 10 MG tablet Take 10 mg by mouth daily.  . chlorthalidone (HYGROTON) 50 MG tablet Take 1 tablet by mouth daily.  . potassium chloride (K-DUR) 10 MEQ tablet Take 10 mEq by mouth daily.  Marland Kitchen. spironolactone (ALDACTONE) 100 MG tablet Take 100 mg by mouth daily.  . [DISCONTINUED] nebivolol (BYSTOLIC) 10 MG tablet Take 10 mg by mouth daily.    No Known Allergies  Social History   Social History  . Marital status: Married    Spouse name: N/A  . Number of children: N/A  . Years of education: N/A   Social History Main Topics  . Smoking status: Never Smoker  . Smokeless tobacco: Never Used  . Alcohol use Yes     Comment: occasional  . Drug use: No  . Sexual activity: Not  Asked   Other Topics Concern  . None   Social History Narrative  . None    family history includes Cancer in his brother, father, mother, sister, and sister; Hypertension in his brother, sister, and sister. - He has 3 brothers all with hypertension. 4 sisters with hypertension - one has diabetes. One of the 3 brothers died from cancer  Wt Readings from Last 3 Encounters:  05/18/17 254 lb (115.2 kg)  04/03/17 278 lb (126.1 kg)  01/22/17 278 lb (126.1 kg)     PHYSICAL EXAM BP (!) 160/104   Pulse 72   Ht 6\' 3"  (1.905 m)   Wt 254 lb (115.2 kg)   BMI 31.75 kg/m  General appearance: alert, cooperative, appears stated age, no distress. Mildly obese, otherwise well-nourished, well-groomed. HEENT: [place]/AT, MMM, anicteric sclera Neck: no adenopathy, no carotid bruit and no JVD Lungs: clear to auscultation bilaterally, normal percussion bilaterally and non-labored; no W/R/R Heart: regular rate and rhythm, S1 &S2 normal, no murmur, click, rub or gallop; nondisplaced PMI Abdomen: soft, non-tender; bowel sounds normal; no masses,  no organomegaly; no HJR Extremities: extremities normal, atraumatic, no cyanosis, or edema  Pulses: 2+ and symmetric;  Skin: mobility and turgor normal, no edema, no evidence of bleeding or bruising, no lesions noted and texture normal  Neurologic: Mental status: Alert & oriented x 3, thought content appropriate; non-focal exam.  Pleasant mood & affect. Cranial nerves: normal (II-XII grossly intact)    Adult ECG Report  Rate: 72 ;  Rhythm: normal sinus rhythm and LVH with repolarization ST segment changes.~Left atrial anomaly. Otherwise normal axis, intervals and durations. ;   Narrative Interpretation: Stable EKG from June 2015   Other studies Reviewed: Additional studies/ records that were reviewed today include:  Recent Labs:  Labs reviewed above. - Additional labs include glucose 165   03/13/2012: TC 107, TG 98, HDL 36, LDL 55; glucose 129   ASSESSMENT / PLAN: Problem List Items Addressed This Visit    Abnormal EKG (Chronic)    He does have evidence of LVH with repolarization changes on his EKG this is stable since 2015. This is in setting of significant hypertension which would suggest the presence of hypertensive heart disease. I don't hear any murmurs and he does not have any signs of heart failure. However with his significant risk factors, I would like to check 2-D echocardiogram to get baseline function both  systolic and diastolic.      Relevant Orders   EKG 12-Lead (Completed)   ECHOCARDIOGRAM COMPLETE   [place] cardiac risk 10-20% in next 10 years (Chronic)    He does have significant hypertension along with hyperlipidemia on statin. His LDL is well controlled, but HDL is below goal combined with obesity, hyperglycemia and hypertension he goes metabolic syndrome.  - Continue statin and continue to work on aggressive blood pressure management.  I'm not quite sure when exposed to following up with the referring provider (patient instructions suggested follow-up in 5 years which is probably not accurate. He will need to continue to follow-up to evaluate for signs of diabetes and treatment.      Relevant Orders   EKG 12-Lead (Completed)   VAS US RENAL ARTERY DUPLEX   ECHOCARDIOGRAM COMPLETE   H/O hypokalemia (Chronic)    Most recent potassium was better controlled. He is taking potassium supplement in addition to significant dose of spironolactone for this chlorthalidone. We continue that, but hopefully with the ARB will be able to improve the control. We will  need to continue to follow labs after his follow-up appointment.      Relevant Orders   VAS US RENAL ARTERY DUPLEX   ECHOCARDIOGRAM COMPLETE   Resistant hypertension - Primary (Chronic)    Bernabe does have hypertension but no signs of heart failure or secondary damage as yet. His lab work in the past as shown hypokalemia which gives a suggestion of possible hyperaldosteronism. Unfortunately we are beyond being evaluated that now with him being on spironolactone along with beta blocker.  Plan: Suspect he will require multiple medications  Increase Bystolic to 20 mg daily  Add valsartan 80 mg daily with plans to titrate further  Check 2-D echocardiogram to evaluate for evidence of hypertensive heart disease  Check renal artery Dopplers to exclude RAS      Relevant Medications   BYSTOLIC 20 MG TABS   valsartan (DIOVAN) 80 MG  tablet   Nebivolol HCl (BYSTOLIC) 20 MG TABS   Other Relevant Orders   EKG 12-Lead (Completed)   VAS US RENAL ARTERY DUPLEX   ECHOCARDIOGRAM COMPLETE      Current medicines are reviewed at length with the patient today. (+/- concerns) n/a The following changes have been made: n/a  Patient Instructions  MED CATION CHANGE --INCREASE BYSTOLIC TO 20 MG DAILY  START VALSARTAN 80 MG ONE TABLET DAILY   SCHEDULE AT [address] Your physician has requested that you have an echocardiogram. Echocardiography is a painless test that uses sound waves to create images of your heart. It provides your doctor with information about the size and shape of your heart and how well your heart's chambers and valves are working. This procedure takes approximately one hour. There are no restrictions for this procedure.   SCHEDULE AT [address]  [address] Your physician has requested that you have a renal artery duplex. During this test, an ultrasound is used to evaluate blood flow to the kidneys. Allow one hour for this exam. Do not eat after midnight the day before and avoid carbonated beverages. Take your medications as you usually do.   Your physician recommends that you schedule a follow-up appointment in 1 MONTH WITH DR Dannika Hilgeman.   Studies Ordered:   Orders Placed This Encounter  Procedures  . EKG 12-Lead  . ECHOCARDIOGRAM COMPLETE      Bryan Lemma, M.D., M.S. Interventional Cardiologist   Pager # [phone] Phone # [phone] [address] [place], [place] [address]

## 2017-05-19 ENCOUNTER — Encounter: Payer: Self-pay | Admitting: Cardiology

## 2017-05-20 NOTE — Assessment & Plan Note (Signed)
He does have significant hypertension along with hyperlipidemia on statin. His LDL is well controlled, but HDL is below goal combined with obesity, hyperglycemia and hypertension he goes metabolic syndrome.  - Continue statin and continue to work on aggressive blood pressure management.  I'm not quite sure when exposed to following up with the referring provider (patient instructions suggested follow-up in 5 years which is probably not accurate. He will need to continue to follow-up to evaluate for signs of diabetes and treatment.

## 2017-05-20 NOTE — Assessment & Plan Note (Signed)
He does have evidence of LVH with repolarization changes on his EKG this is stable since 2015. This is in setting of significant hypertension which would suggest the presence of hypertensive heart disease. I don't hear any murmurs and he does not have any signs of heart failure. However with his significant risk factors, I would like to check 2-D echocardiogram to get baseline function both systolic and diastolic.

## 2017-05-20 NOTE — Assessment & Plan Note (Signed)
Most recent potassium was better controlled. He is taking potassium supplement in addition to significant dose of spironolactone for this chlorthalidone. We continue that, but hopefully with the ARB will be able to improve the control. We will need to continue to follow labs after his follow-up appointment.

## 2017-05-20 NOTE — Assessment & Plan Note (Signed)
[name] does have hypertension but no signs of heart failure or secondary damage as yet. His lab work in the past as shown hypokalemia which gives a suggestion of possible hyperaldosteronism. Unfortunately we are beyond being evaluated that now with him being on spironolactone along with beta blocker.  Plan: Suspect he will require multiple medications  Increase Bystolic to 20 mg daily  Add valsartan 80 mg daily with plans to titrate further  Check 2-D echocardiogram to evaluate for evidence of hypertensive heart disease  Check renal artery Dopplers to exclude RAS

## 2017-05-26 ENCOUNTER — Other Ambulatory Visit: Payer: Self-pay | Admitting: Cardiology

## 2017-05-26 DIAGNOSIS — I10 Essential (primary) hypertension: Secondary | ICD-10-CM

## 2017-05-29 ENCOUNTER — Ambulatory Visit (HOSPITAL_COMMUNITY): Payer: BLUE CROSS/BLUE SHIELD | Attending: Cardiovascular Disease

## 2017-05-29 ENCOUNTER — Other Ambulatory Visit: Payer: Self-pay

## 2017-05-29 DIAGNOSIS — R9431 Abnormal electrocardiogram [ECG] [EKG]: Secondary | ICD-10-CM

## 2017-05-29 DIAGNOSIS — Z9189 Other specified personal risk factors, not elsewhere classified: Secondary | ICD-10-CM | POA: Insufficient documentation

## 2017-05-29 DIAGNOSIS — Z8639 Personal history of other endocrine, nutritional and metabolic disease: Secondary | ICD-10-CM | POA: Diagnosis not present

## 2017-05-29 DIAGNOSIS — I10 Essential (primary) hypertension: Secondary | ICD-10-CM

## 2017-05-29 DIAGNOSIS — I351 Nonrheumatic aortic (valve) insufficiency: Secondary | ICD-10-CM | POA: Insufficient documentation

## 2017-05-29 DIAGNOSIS — I119 Hypertensive heart disease without heart failure: Secondary | ICD-10-CM | POA: Insufficient documentation

## 2017-05-29 HISTORY — PX: OTHER SURGICAL HISTORY: SHX169

## 2017-05-29 HISTORY — PX: TRANSTHORACIC ECHOCARDIOGRAM: SHX275

## 2017-05-31 NOTE — Progress Notes (Signed)
Echo results: Good news: Essentially normal echocardiogram and normal pump function and normal valve function.EF: 55-60%. No regional wall motion abnormalities =   No signs to suggest heart attack  David Harding, MD    

## 2017-06-04 ENCOUNTER — Ambulatory Visit (HOSPITAL_COMMUNITY)
Admission: RE | Admit: 2017-06-04 | Discharge: 2017-06-04 | Disposition: A | Discharge status: Home / Self Care | Payer: BLUE CROSS/BLUE SHIELD | Source: Ambulatory Visit | Attending: Cardiovascular Disease | Admitting: Cardiovascular Disease

## 2017-06-04 DIAGNOSIS — I10 Essential (primary) hypertension: Secondary | ICD-10-CM | POA: Insufficient documentation

## 2017-06-04 DIAGNOSIS — Q6102 Congenital multiple renal cysts: Secondary | ICD-10-CM | POA: Diagnosis not present

## 2017-06-04 DIAGNOSIS — Q633 Hyperplastic and giant kidney: Secondary | ICD-10-CM | POA: Diagnosis not present

## 2017-06-18 ENCOUNTER — Ambulatory Visit (INDEPENDENT_AMBULATORY_CARE_PROVIDER_SITE_OTHER): Payer: BLUE CROSS/BLUE SHIELD | Admitting: Cardiology

## 2017-06-18 ENCOUNTER — Encounter: Payer: Self-pay | Admitting: Cardiology

## 2017-06-18 VITALS — BP 140/100 | HR 70 | Ht 75.0 in | Wt 255.0 lb

## 2017-06-18 DIAGNOSIS — N281 Cyst of kidney, acquired: Secondary | ICD-10-CM | POA: Insufficient documentation

## 2017-06-18 DIAGNOSIS — I119 Hypertensive heart disease without heart failure: Secondary | ICD-10-CM | POA: Insufficient documentation

## 2017-06-18 DIAGNOSIS — Z9189 Other specified personal risk factors, not elsewhere classified: Secondary | ICD-10-CM | POA: Diagnosis not present

## 2017-06-18 DIAGNOSIS — E785 Hyperlipidemia, unspecified: Secondary | ICD-10-CM | POA: Insufficient documentation

## 2017-06-18 DIAGNOSIS — I10 Essential (primary) hypertension: Secondary | ICD-10-CM

## 2017-06-18 LAB — COMPREHENSIVE METABOLIC PANEL
ALBUMIN: 4 g/dL (ref 3.5–5.5)
ALK PHOS: 66 IU/L (ref 39–117)
ALT: 14 IU/L (ref 0–44)
AST: 17 IU/L (ref 0–40)
Albumin/Globulin Ratio: 1.3 (ref 1.2–2.2)
BILIRUBIN TOTAL: 0.4 mg/dL (ref 0.0–1.2)
BUN / CREAT RATIO: 21 — AB (ref 9–20)
BUN: 27 mg/dL — ABNORMAL HIGH (ref 6–24)
CHLORIDE: 103 mmol/L (ref 96–106)
CO2: 26 mmol/L (ref 20–29)
Calcium: 9.7 mg/dL (ref 8.7–10.2)
Creatinine, Ser: 1.31 mg/dL — ABNORMAL HIGH (ref 0.76–1.27)
GFR calc non Af Amer: 60 mL/min/{1.73_m2} (ref >59)
GFR, EST AFRICAN AMERICAN: 69 mL/min/{1.73_m2} (ref >59)
GLOBULIN, TOTAL: 3 g/dL (ref 1.5–4.5)
Glucose: 121 mg/dL — ABNORMAL HIGH (ref 65–99)
Potassium: 4.1 mmol/L (ref 3.5–5.2)
SODIUM: 143 mmol/L (ref 134–144)
TOTAL PROTEIN: 7 g/dL (ref 6.0–8.5)

## 2017-06-18 LAB — LIPID PANEL
Chol/HDL Ratio: 2.6 ratio (ref 0.0–5.0)
Cholesterol, Total: 95 mg/dL — ABNORMAL LOW (ref 100–199)
HDL: 36 mg/dL — AB (ref >39)
LDL Calculated: 43 mg/dL (ref 0–99)
TRIGLYCERIDES: 82 mg/dL (ref 0–149)
VLDL CHOLESTEROL CAL: 16 mg/dL (ref 5–40)

## 2017-06-18 MED ORDER — VALSARTAN 160 MG PO TABS: 160.0000 mg | ORAL_TABLET | Freq: Every day | ORAL | 6 refills | Status: DC

## 2017-06-18 NOTE — Progress Notes (Signed)
PCP: Daylene Katayama, PA  Clinic Note: Chief Complaint  Patient presents with  . Follow-up    HTN management; HTN Heart Dz    HPI: Kristopher GAMEL Sr. is a 56 y.o. male who is being seen today for follow-up evaluation of resistant HTN at the request of Trenton Gammon, [place], [place] [address] [place], [place])  Kristopher SHADER Sr. was last seen on 05/18/2017  Recent Hospitalizations: n/a  Studies Personally Reviewed - (if available, images/films reviewed: From Epic Chart or Care Everywhere)  2DEcho 05/29/2017: Normal Echo. Mod Conc LVH. EF 55-60%.  No RWMA. Normal Valves. Gr 1 DD.  Renal A Dopplers 06/04/2017: Normal Renal A Bilaterally.  L > R kidney (3.2 cm) . Renal Cysts noted  - larges ~3.7 cm x 3.5 cm (R kidney), L kidney - ~1.7 cm x 1.6 cm -> recommend referral to Nephrology  Interval History: Kristopher [name] presents here today to follow-up from his recent studies reviewed above. He continues to be relatively asymptomaticWith no resting or exertional dyspnea. He is somewhat deconditioned and therefore will have some expected exertional dyspnea he pushes it. No PND, orthopnea or edema. No headaches or blurred vision, dizziness wooziness. No chest tightness or pressure with rest or exertion. No rapid irregular heartbeat/palpitations to suggest arrhythmias. No syncope/near syncope or TIAs /amaurosis fugax.  No dysuria, hematuria or urinary frequency.  No claudication.Walking is limited by bilateral knee osteoarthritis pains  ROS: A comprehensive was performed.Pertinent symptoms noted above  Review of Systems  Constitutional: Negative for malaise/fatigue.  HENT: Negative.  Negative for congestion and nosebleeds.   Eyes: Negative for blurred vision.  Respiratory: Negative for cough, shortness of breath and wheezing.   Cardiovascular:       Per history of present illness  Gastrointestinal: Negative for blood in stool and melena.  Genitourinary: Negative for hematuria.  Musculoskeletal:  Positive for joint pain (Both knees).  Skin: Negative.   Neurological: Negative for dizziness.  Endo/Heme/Allergies: Negative for environmental allergies.  Psychiatric/Behavioral: Negative.        He has notably poor recollection of things discussed.  All other systems reviewed and are negative.   I have reviewed and (if needed) personally updated the patient's problem list, medications, allergies, past medical and surgical history, social and family history.   Past Medical History:  Diagnosis Date  . Arthritis   . Hypertension     Past Surgical History:  Procedure Laterality Date  . HAND SURGERY    . KNEE ARTHROSCOPY Left   . TRANSTHORACIC ECHOCARDIOGRAM  05/2017   Normal Echo. Mod Conc LVH. EF 55-60%.  No RWMA. Normal Valves. Gr 1 DD.    Current Meds  Medication Sig  . acetaminophen (TYLENOL) 325 MG tablet Take 650 mg by mouth every 6 (six) hours as needed.  Marland Kitchen atorvastatin (LIPITOR) 10 MG tablet Take 10 mg by mouth daily.  . chlorthalidone (HYGROTON) 50 MG tablet Take 1 tablet by mouth daily.  . Nebivolol HCl (BYSTOLIC) 20 MG TABS Take 1 tablet (20 mg total) by mouth daily.  . potassium chloride (K-DUR) 10 MEQ tablet Take 10 mEq by mouth daily.  Marland Kitchen spironolactone (ALDACTONE) 100 MG tablet Take 100 mg by mouth daily.  . [DISCONTINUED] valsartan (DIOVAN) 80 MG tablet Take 1 tablet (80 mg total) by mouth daily.    No Known Allergies  Social History   Social History  . Marital status: Married    Spouse name: N/A  . Number of children: N/A  . Years of education: N/A  Social History Main Topics  . Smoking status: Never Smoker  . Smokeless tobacco: Never Used  . Alcohol use Yes     Comment: occasional  . Drug use: No  . Sexual activity: Not Asked   Other Topics Concern  . None   Social History Narrative   His sister: Valeria Batman (lives in [place]. Is quite involved in his care - may call to discuss.  [name] has given permission to talk with her)    family  history includes Cancer in his brother, father, mother, sister, and sister; Hypertension in his brother, sister, and sister.  Wt Readings from Last 3 Encounters:  06/18/17 255 lb (115.7 kg)  05/18/17 254 lb (115.2 kg)  04/03/17 278 lb (126.1 kg)    PHYSICAL EXAM BP (!) 140/100 (BP Location: Right Arm, Patient Position: Sitting, Cuff Size: Large)   Pulse 70   Ht 6\' 3"  (1.905 m)   Wt 255 lb (115.7 kg)   BMI 31.87 kg/m  General appearance: alert, cooperative, appears stated age, no distress. Mildly  obese. Well-nourished well-groomed  HEENT: [place]/AT, EOMI, MMM, anicteric sclera Neck: no adenopathy, no carotid bruit and no JVD Lungs: clear to auscultation bilaterally, normal percussion bilaterally and non-labored Heart: regular rate and rhythm, S1 &S2 normal, no murmur, click, rub or gallop; nondisplaced PMI  Abdomen: soft, non-tender; bowel sounds normal; no masses,  no organomegaly; no HJR  Extremities: extremities normal, atraumatic, no cyanosis,or edema  Pulses: 2+ and symmetric;  Neurologic: Mental status: Alert & oriented x 3, thought content appropriate; non-focal exam.  Pleasant mood & affect.   Adult ECG Report Not checked  Other studies Reviewed: Additional studies/ records that were reviewed today include:  Recent Labs:  - drawn today following clinic visit.  ASSESSMENT / PLAN: Problem List Items Addressed This Visit    Bilateral renal cysts    Incidental finding on renal artery Dopplers, however not certain that this could potentially be related to his hypertension or with the big concern for possible kidney disease. I will refer to nephrology in consultation for evaluation.      Relevant Orders   Ambulatory referral to Nephrology   [place] cardiac risk 10-20% in next 10 years (Chronic)    On stable from cardiac protection regimen with beta blocker, ARB, spironolactone, statin and aspirin. Plan: Check lipid panel and chemistries.      Hyperlipidemia with target  LDL less than 100 (Chronic)    On statin - no labs in > 6 months.  Check FLP & CMP       Relevant Medications   valsartan (DIOVAN) 160 MG tablet   Other Relevant Orders   Lipid panel (Completed)   Comprehensive metabolic panel (Completed)   Hypertensive heart disease without heart failure - Primary (Chronic)    Moderate Concentric LVH with Gr 2 DD on Echo: Plan - increase Diovan to 160 mmHg (diastolic pressure still high). Continue Bystolic, Chlorthalidone & spironolactone. No CHF Sx. Check chemistries      Relevant Medications   valsartan (DIOVAN) 160 MG tablet   Resistant hypertension (Chronic)    Still not quite controlled, although he says he did not take his medicines until he just arrived and was running late. Renal Dopplers did not show any evidence of renal artery stenosis, however there was suggestion of renal cysts. I still think we have room to increase valsartan and 160 mg daily. He will otherwise continue Bystolic at 20 mg and chlorthalidone at 50 mg along with spironolactone  100 mg.  Plan: Increase valsartan 150 mg daily. Refer to nephrology for evaluation renal cysts       Relevant Medications   valsartan (DIOVAN) 160 MG tablet   Other Relevant Orders   Ambulatory referral to Nephrology   Comprehensive metabolic panel (Completed)      Current medicines are reviewed at length with the patient today. (+/- concerns) n/a The following changes have been made: increase Diovan dose  Patient Instructions  Labs today CMP LIPIDS   MEDICATION CHANGE INCREASE VALSARTAN 160 MG - ONE BY MOUTH DAILY.     You have been referred to NEPHROLOGY- [facility]-- FOLLOW UP ON RENAL DOPPLER    Your physician recommends that you schedule a follow-up appointment [facility]     Your physician wants you to follow-up in 6 MONTHS WITH DR HARDING. You will receive a reminder letter in the mail two months in advance. If you don't receive a letter,  please call our office to schedule the follow-up appointment.      Studies Ordered:   Orders Placed This Encounter  Procedures  . Lipid panel  . Comprehensive metabolic panel  . Ambulatory referral to Nephrology      Bryan Lemmaavid Harding, M.D., M.S. Interventional Cardiologist   Pager # [phone] Phone # [phone] [address] [place], [place] [id number]

## 2017-06-18 NOTE — Assessment & Plan Note (Addendum)
Moderate Concentric LVH with Gr 2 DD on Echo: Plan - increase Diovan to 160 mmHg (diastolic pressure still high). Continue Bystolic, Chlorthalidone & spironolactone. No CHF Sx. Check chemistries

## 2017-06-18 NOTE — Assessment & Plan Note (Signed)
On statin - no labs in > 6 months.  Check FLP & CMP

## 2017-06-18 NOTE — Patient Instructions (Addendum)
Labs today CMP LIPIDS   MEDICATION CHANGE INCREASE VALSARTAN 160 MG - ONE BY MOUTH DAILY.     You have been referred to NEPHROLOGY- [facility]-- FOLLOW UP ON RENAL DOPPLER    Your physician recommends that you schedule a follow-up appointment in 3 MONTHS [facility]     Your physician wants you to follow-up in 6 MONTHS WITH DR HARDING. You will receive a reminder letter in the mail two months in advance. If you don't receive a letter, please call our office to schedule the follow-up appointment.

## 2017-06-19 ENCOUNTER — Encounter: Payer: Self-pay | Admitting: Cardiology

## 2017-06-19 NOTE — Assessment & Plan Note (Signed)
Still not quite controlled, although he says he did not take his medicines until he just arrived and was running late. Renal Dopplers did not show any evidence of renal artery stenosis, however there was suggestion of renal cysts. I still think we have room to increase valsartan and 160 mg daily. He will otherwise continue Bystolic at 20 mg and chlorthalidone at 50 mg along with spironolactone 100 mg.  Plan: Increase valsartan 150 mg daily. Refer to nephrology for evaluation renal cysts

## 2017-06-19 NOTE — Assessment & Plan Note (Signed)
On stable from cardiac protection regimen with beta blocker, ARB, spironolactone, statin and aspirin. Plan: Check lipid panel and chemistries.

## 2017-06-19 NOTE — Assessment & Plan Note (Signed)
Incidental finding on renal artery Dopplers, however not certain that this could potentially be related to his hypertension or with the big concern for possible kidney disease. I will refer to nephrology in consultation for evaluation.

## 2017-06-26 ENCOUNTER — Telehealth: Payer: Self-pay | Admitting: Cardiology

## 2017-06-26 NOTE — Telephone Encounter (Signed)
Received fax confirmed of appointment with Dr. Arrie Aranoladonato on 08-05-17 at 11 a.m.  Message sent to [employer]aron Martin.

## 2017-07-06 ENCOUNTER — Other Ambulatory Visit: Payer: Self-pay | Admitting: Cardiology

## 2017-08-05 DIAGNOSIS — E876 Hypokalemia: Secondary | ICD-10-CM | POA: Diagnosis not present

## 2017-08-05 DIAGNOSIS — R7303 Prediabetes: Secondary | ICD-10-CM | POA: Diagnosis not present

## 2017-08-05 DIAGNOSIS — N281 Cyst of kidney, acquired: Secondary | ICD-10-CM | POA: Diagnosis not present

## 2017-08-05 DIAGNOSIS — I10 Essential (primary) hypertension: Secondary | ICD-10-CM | POA: Diagnosis not present

## 2017-08-05 DIAGNOSIS — N182 Chronic kidney disease, stage 2 (mild): Secondary | ICD-10-CM | POA: Diagnosis not present

## 2017-08-19 DIAGNOSIS — I10 Essential (primary) hypertension: Secondary | ICD-10-CM | POA: Diagnosis not present

## 2017-08-27 DIAGNOSIS — H40013 Open angle with borderline findings, low risk, bilateral: Secondary | ICD-10-CM | POA: Diagnosis not present

## 2017-09-14 ENCOUNTER — Other Ambulatory Visit: Payer: Self-pay | Admitting: *Deleted

## 2017-09-14 MED ORDER — NEBIVOLOL HCL 20 MG PO TABS: 20.0000 mg | ORAL_TABLET | Freq: Every day | ORAL | 2 refills | Status: DC

## 2017-09-17 ENCOUNTER — Ambulatory Visit (INDEPENDENT_AMBULATORY_CARE_PROVIDER_SITE_OTHER): Payer: BLUE CROSS/BLUE SHIELD | Admitting: Physician Assistant

## 2017-09-17 ENCOUNTER — Encounter: Payer: Self-pay | Admitting: Physician Assistant

## 2017-09-17 VITALS — BP 150/92 | HR 73 | Ht 75.0 in | Wt 269.4 lb

## 2017-09-17 DIAGNOSIS — I10 Essential (primary) hypertension: Secondary | ICD-10-CM | POA: Diagnosis not present

## 2017-09-17 DIAGNOSIS — G4700 Insomnia, unspecified: Secondary | ICD-10-CM | POA: Diagnosis not present

## 2017-09-17 DIAGNOSIS — Z79899 Other long term (current) drug therapy: Secondary | ICD-10-CM | POA: Diagnosis not present

## 2017-09-17 MED ORDER — LOSARTAN POTASSIUM 25 MG PO TABS: 25.0000 mg | ORAL_TABLET | Freq: Every day | ORAL | 3 refills | Status: DC

## 2017-09-17 NOTE — Progress Notes (Signed)
Cardiology Office Note    Date:  09/18/2017   ID:  Kristopher Mcalpine Sr., DOB 02/07/1961, MRN [id number]  PCP:  Daylene Katayama, PA  Cardiologist:  Dr. Herbie Baltimore  Chief Complaint  Patient presents with  . Follow-up    HTN medication titration. Seen for Dr. Herbie Baltimore.    History of Present Illness:  Kristopher SEIB Sr. is a 56 y.o. male with PMH of resistant HTN. He had echocardiogram on 05/29/2017 which showed moderate concentric LVH, EF 55-60%, grade 1 DD. Renal artery Doppler obtained on 06/04/2017 showed normal renal arteries bilaterally, renal cyst noted. Recommend referral to nephrology. During the last visit with Dr. Herbie Baltimore on 06/18/2017, Diovan was increased to 130 mg. He is also on bystolic, chlorthalidone and the spironolactone. He presents today for 3 month office visit. Last lipid panel obtained in June 2018 showed total cholesterol 95, HDL 36, LDL 43, triglyceride 82.  Patient presents today for cardiology office visit. He has been seen by Dr. Malachi Bonds who put him on amlodipine 10 mg. He is still taking valsartan 160 mg daily. His blood pressure on initial arrival was 150/92. I manually checked his blood pressure, left side was 146/96, right side was 136/88. I think a large contributing factor is his work habit and the fact he works second shift. He does admit he does not sleep very long per night. He actually had a sleep study years ago around 2014 that was reportedly negative for obstructive sleep apnea. Although I am unable to locate such record. Due to the recent recall all valsartan, I discontinue the losartan and switched him to losartan 25 mg daily. He will need a basic metabolic panel in a week and have follow-up in 3 months. Otherwise he denies any significant discomfort, shortness breath, lower extremity edema, orthopnea or PND.    Past Medical History:  Diagnosis Date  . Arthritis   . Hypertension     Past Surgical History:  Procedure Laterality Date  . HAND SURGERY     . KNEE ARTHROSCOPY Left   . TRANSTHORACIC ECHOCARDIOGRAM  05/2017   Normal Echo. Mod Conc LVH. EF 55-60%.  No RWMA. Normal Valves. Gr 1 DD.    Current Medications: Outpatient Medications Prior to Visit  Medication Sig Dispense Refill  . acetaminophen (TYLENOL) 325 MG tablet Take 650 mg by mouth every 6 (six) hours as needed.    Kristopher Kitchen atorvastatin (LIPITOR) 10 MG tablet Take 10 mg by mouth daily.  0  . chlorthalidone (HYGROTON) 25 MG tablet TAKE 1 TABLET BY MOUTH EVERY DAY 30 tablet 2  . Nebivolol HCl (BYSTOLIC) 20 MG TABS Take 1 tablet (20 mg total) by mouth daily. 90 tablet 2  . potassium chloride (K-DUR) 10 MEQ tablet Take 10 mEq by mouth daily.    Kristopher Kitchen spironolactone (ALDACTONE) 50 MG tablet TAKE 1 TABLET BY MOUTH EVERY DAY 30 tablet 2  . valsartan (DIOVAN) 160 MG tablet Take 1 tablet (160 mg total) by mouth daily. 30 tablet 6  . chlorthalidone (HYGROTON) 50 MG tablet Take 1 tablet by mouth daily.  0  . spironolactone (ALDACTONE) 100 MG tablet Take 100 mg by mouth daily.     No facility-administered medications prior to visit.      Allergies:   Patient has no known allergies.   Social History   Social History  . Marital status: Married    Spouse name: N/A  . Number of children: N/A  . Years of education: N/A   Social History  Main Topics  . Smoking status: Never Smoker  . Smokeless tobacco: Never Used  . Alcohol use Yes     Comment: occasional  . Drug use: No  . Sexual activity: Not Asked   Other Topics Concern  . None   Social History Narrative   His sister: Kristopher [name] (lives in [place]. Is quite involved in his care - may call to discuss.  Mr. Gillison has given permission to talk with her)     Family History:  The patient's family history includes Cancer in his brother, father, mother, sister, and sister; Hypertension in his brother, sister, and sister.   ROS:   Please see the history of present illness.    ROS All other systems reviewed and are  negative.   PHYSICAL EXAM:   VS:  BP (!) 150/92   Pulse 73   Ht  (1.905 m)   Wt 269 lb 6.4 oz (122.2 kg)   BMI 33.67 kg/m    GEN: Well nourished, well developed, in no acute distress  HEENT: normal  Neck: no JVD, carotid bruits, or masses Cardiac: RRR; no murmurs, rubs, or gallops,no edema  Respiratory:  clear to auscultation bilaterally, normal work of breathing GI: soft, nontender, nondistended, + BS MS: no deformity or atrophy  Skin: warm and dry, no rash Neuro:  Alert and Oriented x 3, Strength and sensation are intact Psych: euthymic mood, full affect  Wt Readings from Last 3 Encounters:  09/17/17 269 lb 6.4 oz (122.2 kg)  06/18/17 255 lb (115.7 kg)  05/18/17 254 lb (115.2 kg)      Studies/Labs Reviewed:   EKG:  EKG is not ordered today.   Recent Labs: 11/07/2016: Hemoglobin 12.9 06/18/2017: ALT 14; BUN 27; Creatinine, Ser 1.31; Potassium 4.1; Sodium 143   Lipid Panel    Component Value Date/Time   CHOL 95 (L) 06/18/2017 0914   TRIG 82 06/18/2017 0914   HDL 36 (L) 06/18/2017 0914   CHOLHDL 2.6 06/18/2017 0914   LDLCALC 43 06/18/2017 0914    Additional studies/ records that were reviewed today include:   Echo 05/29/2017 LV EF: 55% -   60%  Study Conclusions  - Left ventricle: There was moderate concentric hypertrophy.   Systolic function was normal. The estimated ejection fraction was   in the range of 55% to 60%. Wall motion was normal; there were no   regional wall motion abnormalities. Doppler parameters are   consistent with abnormal left ventricular relaxation (grade 1   diastolic dysfunction). - Aortic valve: There was mild regurgitation. - Left atrium: The atrium was mildly dilated. - Right ventricle: The cavity size was mildly dilated. - Right atrium: The atrium was mildly dilated. - Atrial septum: No defect or patent foramen ovale was identified.   ASSESSMENT:    1. Uncontrolled hypertension   2. Medication management   3.  Insomnia, unspecified type      PLAN:  In order of problems listed above:  1. Uncontrolled hypertension: We'll discontinue valsartan which is currently on the recall [place], switched to losartan 25 mg daily. According to the patient, he has had a sleep study in 2014 which reportedly to be negative for significant OSA, however I am unable to locate the record. Likely contributor for his uncontrolled hypertension is his work schedule and insomnia.  2. Insomnia: Has very poor sleep hygiene, complicated by work schedule. Tend to wake up multiple times throughout the night.    Medication Adjustments/Labs and Tests Ordered: Current medicines  are reviewed at length with the patient today.  Concerns regarding medicines are outlined above.  Medication changes, Labs and Tests ordered today are listed in the Patient Instructions below. Patient Instructions  Medication Instructions: STOP Valsartan START Losartan 25 mg tablet daily  If you need a refill on your cardiac medications before your next appointment, please call your pharmacy.   Labwork: Your physician recommends that you return for lab work in: one week for a BMET. You may have this done at the [facility] office. You do not need an appointment.    Follow-Up: Your physician wants you to follow-up in: 3 months with Dr. Herbie Baltimore or Azalee Course, PA    Special Instructions:  * Please take your blood pressure medication 2-3 hours before your appointment.   Thank you for choosing [facility] at [facility], [place]  09/18/2017 9:06 AM    [facility] [address] [place], [place], [place]  [address] Phone: [phone]; Fax: [phone]

## 2017-09-17 NOTE — Patient Instructions (Signed)
Medication Instructions: STOP Valsartan START Losartan 25 mg tablet daily  If you need a refill on your cardiac medications before your next appointment, please call your pharmacy.   Labwork: Your physician recommends that you return for lab work in: one week for a BMET. You may have this done at the [facility] office. You do not need an appointment.    Follow-Up: Your physician wants you to follow-up in: 3 months with Dr. Herbie Baltimore or Azalee Course, PA    Special Instructions:  * Please take your blood pressure medication 2-3 hours before your appointment.   Thank you for choosing Heartcare at [facility]!!

## 2017-09-18 ENCOUNTER — Encounter: Payer: Self-pay | Admitting: Physician Assistant

## 2017-09-21 ENCOUNTER — Ambulatory Visit (INDEPENDENT_AMBULATORY_CARE_PROVIDER_SITE_OTHER): Payer: BLUE CROSS/BLUE SHIELD | Admitting: Family

## 2017-09-25 ENCOUNTER — Ambulatory Visit (INDEPENDENT_AMBULATORY_CARE_PROVIDER_SITE_OTHER): Payer: BLUE CROSS/BLUE SHIELD | Admitting: Family

## 2017-09-25 ENCOUNTER — Encounter (INDEPENDENT_AMBULATORY_CARE_PROVIDER_SITE_OTHER): Payer: Self-pay | Admitting: Family

## 2017-09-25 VITALS — Ht 75.0 in | Wt 260.0 lb

## 2017-09-25 DIAGNOSIS — M1712 Unilateral primary osteoarthritis, left knee: Secondary | ICD-10-CM | POA: Diagnosis not present

## 2017-09-25 NOTE — Progress Notes (Signed)
Office Visit Note   Patient: Kristopher KESTENBAUM Sr.           Date of Birth: [birth year]           MRN: [id number] Visit Date: 09/25/2017              Requested by: Daylene Katayama, PA [address] [place], [place] [address] PCP: Daylene Katayama, PA  Chief Complaint  Patient presents with  . Left Knee - Pain      HPI: The patient is a 56 year old gentleman who presents today for ongoing left knee pain. This is chronic. He has significant osteoarthritis bilaterally. Last injection was in April of this year. He's been using Aleve as needed with moderate relief. Last Depo-Medrol injection only lasted for several weeks.  Assessment & Plan: Visit Diagnoses: No diagnosis found.  Plan: Him Depo-Medrol injection left knee today. We will go ahead and get him prior authorization for Synvisc. He will call us and return for Synvisc injection when knee pain has returned.  Follow-Up Instructions: Return if symptoms worsen or fail to improve.   Right Knee Exam   Tenderness  The patient is experiencing tenderness in the medial joint line.  Muscle Strength   The patient has normal right knee strength.  Tests  Varus: negative Valgus: negative  Other  Erythema: absent Swelling: mild Other tests: effusion present      Patient is alert, oriented, no adenopathy, well-dressed, normal affect, normal respiratory effort.   Imaging: No results found. No images are attached to the encounter.  Labs: Lab Results  Component Value Date   ESRSEDRATE 25 (H) 02/10/2016   LABURIC 9.5 (H) 11/07/2016   REPTSTATUS 02/14/2016 FINAL 02/10/2016   GRAMSTAIN  02/10/2016    NO ORGANISMS SEEN WBC PRESENT, PREDOMINANTLY PMN Gram Stain Report Called to,Read Back By and Verified With: S THORNTON AT 1124 ON 02.12.2017 BY NBROOKS    CULT  02/10/2016    NO GROWTH 3 DAYS Performed at [facility]     Orders:  No orders of the defined types were placed in this  encounter.  No orders of the defined types were placed in this encounter.    Procedures: No procedures performed  Clinical Data: No additional findings.  ROS:  All other systems negative, except as noted in the HPI. Review of Systems  Constitutional: Negative for chills and fever.  Musculoskeletal: Positive for arthralgias and joint swelling.    Objective: Vital Signs: Ht  (1.905 m)   Wt 260 lb (117.9 kg)   BMI 32.50 kg/m   Specialty Comments:  No specialty comments available.  PMFS History: Patient Active Problem List   Diagnosis Date Noted  . Hyperlipidemia with target LDL less than 100 06/18/2017  . Hypertensive heart disease without heart failure 06/18/2017  . Bilateral renal cysts 06/18/2017  . Resistant hypertension 05/18/2017  . H/O hypokalemia 05/18/2017  . [place] cardiac risk 10-20% in next 10 years 03/17/2017  . Diverticulosis of large intestine without hemorrhage 09/09/2016  . Benign essential hypertension 07/15/2016  . Acute bilateral ankle pain 09/04/2015  . Closed nondisplaced fracture of navicular bone of left foot 09/04/2015  . Pain in both feet 09/04/2015  . Primary osteoarthritis of both knees 05/30/2015  . Arthralgia of both knees 01/05/2015  . Arthralgia of left hip 01/05/2015  . Abnormal EKG 06/18/2014  . Knee pain 03/28/2014   Past Medical History:  Diagnosis Date  . Arthritis   . Hypertension  Family History  Problem Relation Age of Onset  . Cancer Mother   . Cancer Father   . Cancer Sister   . Hypertension Sister   . Cancer Brother   . Hypertension Brother   . Cancer Sister   . Hypertension Sister     Past Surgical History:  Procedure Laterality Date  . HAND SURGERY    . KNEE ARTHROSCOPY Left   . TRANSTHORACIC ECHOCARDIOGRAM  05/2017   Normal Echo. Mod Conc LVH. EF 55-60%.  No RWMA. Normal Valves. Gr 1 DD.   Social History   Occupational History  . Not on file.   Social History Main Topics  . Smoking  status: Never Smoker  . Smokeless tobacco: Never Used  . Alcohol use Yes     Comment: occasional  . Drug use: No  . Sexual activity: Not on file

## 2017-09-29 ENCOUNTER — Telehealth (INDEPENDENT_AMBULATORY_CARE_PROVIDER_SITE_OTHER): Payer: Self-pay | Admitting: Radiology

## 2017-09-29 NOTE — Telephone Encounter (Signed)
Submitted to synvisc provider portal, pending VOB.

## 2017-09-29 NOTE — Telephone Encounter (Signed)
-----   Message from Adonis Huguenin, NP sent at 09/25/2017  2:21 PM EDT ----- Will you order synvisc for left knee OA

## 2017-09-30 NOTE — Telephone Encounter (Signed)
I called patient to advised VOB received. Ok for buy and bill, no prior auth required. His insurance would pay for 80% estimated he would be billed for remaining 20%. Estimated to be approximately 300 dollars. He will call us when he is ready for injection, would like to hold off for now, since he just received cortisone injection last week.

## 2017-10-22 ENCOUNTER — Other Ambulatory Visit: Payer: Self-pay | Admitting: Cardiology

## 2017-11-04 DIAGNOSIS — I10 Essential (primary) hypertension: Secondary | ICD-10-CM | POA: Diagnosis not present

## 2017-11-04 DIAGNOSIS — N281 Cyst of kidney, acquired: Secondary | ICD-10-CM | POA: Diagnosis not present

## 2017-11-04 DIAGNOSIS — E876 Hypokalemia: Secondary | ICD-10-CM | POA: Diagnosis not present

## 2017-11-04 DIAGNOSIS — R3129 Other microscopic hematuria: Secondary | ICD-10-CM | POA: Diagnosis not present

## 2017-11-04 DIAGNOSIS — N182 Chronic kidney disease, stage 2 (mild): Secondary | ICD-10-CM | POA: Diagnosis not present

## 2017-11-04 DIAGNOSIS — R7303 Prediabetes: Secondary | ICD-10-CM | POA: Diagnosis not present

## 2017-11-16 ENCOUNTER — Encounter (INDEPENDENT_AMBULATORY_CARE_PROVIDER_SITE_OTHER): Payer: Self-pay | Admitting: Orthopedic Surgery

## 2017-11-16 ENCOUNTER — Ambulatory Visit (INDEPENDENT_AMBULATORY_CARE_PROVIDER_SITE_OTHER): Payer: BLUE CROSS/BLUE SHIELD

## 2017-11-16 ENCOUNTER — Ambulatory Visit (INDEPENDENT_AMBULATORY_CARE_PROVIDER_SITE_OTHER): Payer: BLUE CROSS/BLUE SHIELD | Admitting: Orthopedic Surgery

## 2017-11-16 DIAGNOSIS — M25571 Pain in right ankle and joints of right foot: Secondary | ICD-10-CM | POA: Diagnosis not present

## 2017-11-16 MED ORDER — METHYLPREDNISOLONE 4 MG PO TABS: ORAL_TABLET | ORAL | 0 refills | Status: DC

## 2017-11-16 NOTE — Progress Notes (Signed)
Office Visit Note   Patient: Kristopher McalpineVinson W Eckhardt Sr.           Date of Birth: [birth year]           MRN: [id number] Visit Date: 11/16/2017 Requested by: Daylene KatayamaGordnier, [place], [place] [id number] [place], [place] [address] PCP: Daylene KatayamaGordnier, [place], [place]  Subjective: Chief Complaint  Patient presents with  . Right Ankle - Pain    HPI: Kristopher [name] is a 56 year old patient with right ankle pain.  Denies any history of injury.  Been going on for a week.  Describes medial swelling.  Reports painful weightbearing.  He has been using an ASO.  No history of gout.  Works in [employer].  Has used Aleve.  The brace helps him.              ROS: All systems reviewed are negative as they relate to the chief complaint within the history of present illness.  Patient denies  fevers or chills.   Assessment & Plan: Visit Diagnoses:  1. Pain in right ankle and joints of right foot     Plan: Impression is right medial ankle pain and swelling with some tendinosis noted with ultrasound examination of the posterior tib tendon.  The ankle joint itself does not have an effusion and does not have much with pain in terms of range of motion.  He is having quite a bit of limping.  I like to try him on a Medrol Dosepak and have him keep using the ASO.  Out of work today.  May consider toward all injection into the tendon sheath region in a week if he is not any better.  Plan for 2 week return.  May need more imaging as well.  Gout is a consideration but the fluid and inflammation around the tendon sheath argues against that at this time.  Follow-Up Instructions: Return in about 2 weeks (around 11/30/2017).   Orders:  Orders Placed This Encounter  Procedures  . XR Ankle Complete Right   Meds ordered this encounter  Medications  . methylPREDNISolone (MEDROL) 4 MG tablet    Sig: Take dose pak as directed.    Dispense:  21 tablet    Refill:  0      Procedures: No procedures performed   Clinical Data: No  additional findings.  Objective: Vital Signs: There were no vitals taken for this visit.  Physical Exam:   Constitutional: Patient appears well-developed HEENT:  Head: Normocephalic Eyes:EOM are normal Neck: Normal range of motion Cardiovascular: Normal rate Pulmonary/chest: Effort normal Neurologic: Patient is alert Skin: Skin is warm Psychiatric: Patient has normal mood and affect    Ortho Exam: Orthopedic exam demonstrates fairly painless active and passive range of motion in terms of dorsiflexion and plantarflexion of the right ankle.  He does have tenderness over the medial malleolus and tenderness to palpation of the posterior tib tendon.  Pedal pulses palpable.  No other masses lymph adenopathy or skin changes noted in the right ankle region.  Does have antalgic gait to the right.  No erythema or inflammation noted.  Specialty Comments:  No specialty comments available.  Imaging: Xr Ankle Complete Right  Result Date: 11/16/2017 AP lateral mortise right ankle reviewed.  No fractures present.  Degenerative changes present in the midfoot.  Cocktail spurring is noted.  There is some anterior osteophyte noted off the distal tibia.    PMFS History: Patient Active Problem List   Diagnosis Date Noted  . Hyperlipidemia with  target LDL less than 100 06/18/2017  . Hypertensive heart disease without heart failure 06/18/2017  . Bilateral renal cysts 06/18/2017  . Resistant hypertension 05/18/2017  . H/O hypokalemia 05/18/2017  . Framingham cardiac risk 10-20% in next 10 years 03/17/2017  . Diverticulosis of large intestine without hemorrhage 09/09/2016  . Benign essential hypertension 07/15/2016  . Acute bilateral ankle pain 09/04/2015  . Closed nondisplaced fracture of navicular bone of left foot 09/04/2015  . Pain in both feet 09/04/2015  . Primary osteoarthritis of both knees 05/30/2015  . Arthralgia of both knees 01/05/2015  . Arthralgia of left hip 01/05/2015  .  Abnormal EKG 06/18/2014  . Knee pain 03/28/2014   Past Medical History:  Diagnosis Date  . Arthritis   . Hypertension     Family History  Problem Relation Age of Onset  . Cancer Mother   . Cancer Father   . Cancer Sister   . Hypertension Sister   . Cancer Brother   . Hypertension Brother   . Cancer Sister   . Hypertension Sister     Past Surgical History:  Procedure Laterality Date  . HAND SURGERY    . KNEE ARTHROSCOPY Left   . TRANSTHORACIC ECHOCARDIOGRAM  05/2017   Normal Echo. Mod Conc LVH. EF 55-60%.  No RWMA. Normal Valves. Gr 1 DD.   Social History   Occupational History  . Not on file  Tobacco Use  . Smoking status: Never Smoker  . Smokeless tobacco: Never Used  Substance and Sexual Activity  . Alcohol use: Yes    Comment: occasional  . Drug use: No  . Sexual activity: Not on file

## 2017-11-27 ENCOUNTER — Telehealth: Payer: Self-pay | Admitting: Cardiology

## 2017-11-27 NOTE — Telephone Encounter (Signed)
Received incoming records from [facility] for upcoming appointment on 12/09/17 @ 9:20am with Dr. Herbie BaltimoreHarding. [facility]. 11/27/17 ab

## 2017-11-30 DIAGNOSIS — Z125 Encounter for screening for malignant neoplasm of prostate: Secondary | ICD-10-CM | POA: Diagnosis not present

## 2017-11-30 DIAGNOSIS — D649 Anemia, unspecified: Secondary | ICD-10-CM | POA: Diagnosis not present

## 2017-11-30 DIAGNOSIS — R739 Hyperglycemia, unspecified: Secondary | ICD-10-CM | POA: Diagnosis not present

## 2017-11-30 DIAGNOSIS — N183 Chronic kidney disease, stage 3 (moderate): Secondary | ICD-10-CM | POA: Diagnosis not present

## 2017-11-30 DIAGNOSIS — Z23 Encounter for immunization: Secondary | ICD-10-CM | POA: Diagnosis not present

## 2017-12-09 ENCOUNTER — Ambulatory Visit (INDEPENDENT_AMBULATORY_CARE_PROVIDER_SITE_OTHER): Payer: BLUE CROSS/BLUE SHIELD | Admitting: Cardiology

## 2017-12-09 ENCOUNTER — Encounter: Payer: Self-pay | Admitting: Cardiology

## 2017-12-09 VITALS — BP 132/80 | HR 72 | Ht 75.0 in | Wt 264.8 lb

## 2017-12-09 DIAGNOSIS — I10 Essential (primary) hypertension: Secondary | ICD-10-CM

## 2017-12-09 DIAGNOSIS — M25471 Effusion, right ankle: Secondary | ICD-10-CM | POA: Diagnosis not present

## 2017-12-09 DIAGNOSIS — E785 Hyperlipidemia, unspecified: Secondary | ICD-10-CM | POA: Diagnosis not present

## 2017-12-09 DIAGNOSIS — Z9189 Other specified personal risk factors, not elsewhere classified: Secondary | ICD-10-CM | POA: Diagnosis not present

## 2017-12-09 DIAGNOSIS — M25571 Pain in right ankle and joints of right foot: Secondary | ICD-10-CM | POA: Insufficient documentation

## 2017-12-09 DIAGNOSIS — Z8639 Personal history of other endocrine, nutritional and metabolic disease: Secondary | ICD-10-CM

## 2017-12-09 DIAGNOSIS — I119 Hypertensive heart disease without heart failure: Secondary | ICD-10-CM

## 2017-12-09 NOTE — Progress Notes (Signed)
PCP: Daylene Katayama, PA Nephrologist: Dr. Abel Presto  Clinic Note: Chief Complaint  Patient presents with  . Follow-up    Right ankle swelling  . Hypertension    Malignant, now controlled    HPI: Kristopher GILHAM Sr. is a 56 y.o. male who is being seen today for six-month follow-up evaluation of resistant HTN at the request of Gordnier, [place], [place] [address] [place], [place])  Kristopher CHAFFIN Sr. was last seen on 06/18/2017 --this was in follow-up of 2D echocardiogram showing moderate concentric LVH with normal EF and grade 1 diastolic dysfunction as well as renal artery Doppler showing no stenosis.  His blood pressure was 140/100 mmHg -> we increased dose of Diovan to 160 mg.  Defer to nephrology for evaluation of renal cysts.  Recent Hospitalizations: n/a  He was seen by nephrology on November 7.  They noted that his valsartan had been changed to losartan.  At that visit, his blood pressure was well controlled.  Several labs were checked -available labs noted below.  Studies Personally Reviewed - (if available, images/films reviewed: From Epic Chart or Care Everywhere)  NO NEW studies  Interval History: Kristopher [name] presents here today doing very well overall from a cardiac standpoint.  The only major complaint he has is his right ankle and left knee swelling.  The right ankle is quite painful.  He is also concerned that he was told that he developed diabetes.  He does note  polyuria, but denies polydipsia.  Mostly because his ankle has been bothering him, he has not been as active as usual.  He is concerned that he may have gained weight.  Cardiovascular review of symptoms: no chest pain or dyspnea on exertion negative for - irregular heartbeat, murmur, orthopnea, palpitations, paroxysmal nocturnal dyspnea, rapid heart rate, shortness of breath or Syncope/near syncope, TIA/amaurosis fugax.  No claudication.  Walking is limited by bilateral knee osteoarthritis.  He does not no  claudication symptoms.  ROS: A comprehensive was performed.Pertinent symptoms noted above  Review of Systems  Constitutional: Negative for malaise/fatigue.  HENT: Negative.  Negative for congestion and nosebleeds.   Eyes: Negative for blurred vision.  Respiratory: Negative for cough, shortness of breath and wheezing.   Cardiovascular:       Per history of present illness  Gastrointestinal: Negative for blood in stool and melena.  Genitourinary: Negative for hematuria.  Musculoskeletal: Positive for joint pain (Both knees -left worse than right.  Right ankle is very painful and swollen.).  Skin: Negative.   Neurological: Negative for dizziness.  Endo/Heme/Allergies: Negative for environmental allergies.  Psychiatric/Behavioral: Negative.        He has notably poor recollection of things discussed.  All other systems reviewed and are negative.   I have reviewed and (if needed) personally updated the patient's problem list, medications, allergies, past medical and surgical history, social and family history.   Past Medical History:  Diagnosis Date  . Arthritis   . Hypertension     Past Surgical History:  Procedure Laterality Date  . HAND SURGERY    . KNEE ARTHROSCOPY Left   . RENAL ARTERY DOPPLERS  05/29/2017    Normal Renal A Bilaterally.  L > R kidney (3.2 cm) . Renal Cysts noted  - larges ~3.7 cm x 3.5 cm (R kidney), L kidney - ~1.7 cm x 1.6 cm -> recommend referral to Nephrology  . TRANSTHORACIC ECHOCARDIOGRAM  05/2017   Normal Echo. Mod Conc LVH. EF 55-60%.  No RWMA. Normal Valves. Gr  1 DD.    Current Meds  Medication Sig  . acetaminophen (TYLENOL) 325 MG tablet Take 650 mg by mouth every 6 (six) hours as needed.  Marland Kitchen. amLODipine (NORVASC) 10 MG tablet Take 10 mg by mouth daily.  Marland Kitchen. atorvastatin (LIPITOR) 10 MG tablet Take 10 mg by mouth daily.  . chlorthalidone (HYGROTON) 25 MG tablet TAKE 1 TABLET BY MOUTH EVERY DAY  . losartan (COZAAR) 25 MG tablet Take 1 tablet (25 mg  total) by mouth daily.  . methylPREDNISolone (MEDROL) 4 MG tablet Take dose pak as directed.  . Nebivolol HCl (BYSTOLIC) 20 MG TABS Take 1 tablet (20 mg total) by mouth daily.  . potassium chloride (K-DUR) 10 MEQ tablet Take 10 mEq by mouth daily.  . potassium chloride (MICRO-K) 10 MEQ CR capsule   . spironolactone (ALDACTONE) 50 MG tablet TAKE 1 TABLET BY MOUTH EVERY DAY  . valsartan (DIOVAN) 160 MG tablet     No Known Allergies  Social History   Socioeconomic History  . Marital status: Married    Spouse name: None  . Number of children: None  . Years of education: None  . Highest education level: None  Social Needs  . Financial resource strain: None  . Food insecurity - worry: None  . Food insecurity - inability: None  . Transportation needs - medical: None  . Transportation needs - non-medical: None  Occupational History  . None  Tobacco Use  . Smoking status: Never Smoker  . Smokeless tobacco: Never Used  Substance and Sexual Activity  . Alcohol use: Yes    Comment: occasional  . Drug use: No  . Sexual activity: None  Other Topics Concern  . None  Social History Narrative   His sister: Kristopher [name] ([place] in [place] Is quite involved in his care - may call to discuss.  Mr. Toni AmendCourtney has given permission to talk with her)    family history includes Cancer in his brother, father, mother, sister, and sister; Hypertension in his brother, sister, and sister.  Wt Readings from Last 3 Encounters:  12/09/17 264 lb 12.8 oz (120.1 kg)  09/25/17 260 lb (117.9 kg)  09/17/17 269 lb 6.4 oz (122.2 kg)    PHYSICAL EXAM BP 132/80   Pulse 72   Ht 6\' 3"  (1.905 m)   Wt 264 lb 12.8 oz (120.1 kg)   BMI 33.10 kg/m   Physical Exam  Constitutional: He is oriented to person, place, and time. He appears well-developed and well-nourished. No distress.  Mildly obese.  Well-groomed  HENT:  Head: Normocephalic and atraumatic.  Mouth/Throat: No oropharyngeal exudate.  Eyes: EOM are  normal. No scleral icterus.  Neck: Normal range of motion. Neck supple. No hepatojugular reflux and no JVD present. Carotid bruit is not present.  Cardiovascular: Normal rate, regular rhythm, normal heart sounds and normal pulses.  No extrasystoles are present. PMI is not displaced. Exam reveals no gallop and no friction rub.  No murmur heard. Pulmonary/Chest: Effort normal and breath sounds normal. No respiratory distress. He has no wheezes.  Abdominal: Soft. Bowel sounds are normal. He exhibits no distension. There is no tenderness. There is no rebound.  Musculoskeletal: Normal range of motion. He exhibits no edema.  Right ankle has significant localized swelling on the mid medial malleolus.  This is painful to palpation.  Neurological: He is alert and oriented to person, place, and time.  Skin: Skin is warm and dry.  Psychiatric: He has a normal mood and affect. His behavior  is normal. Judgment and thought content normal.    Adult ECG Report Not checked  Other studies Reviewed: Additional studies/ records that were reviewed today include:  Recent Labs:  -  Lab Results  Component Value Date   CHOL 95 (L) 06/18/2017   HDL 36 (L) 06/18/2017   LDLCALC 43 06/18/2017   TRIG 82 06/18/2017   CHOLHDL 2.6 06/18/2017   Lab Results  Component Value Date   CREATININE 1.31 (H) 06/18/2017   BUN 27 (H) 06/18/2017   NA 143 06/18/2017   K 4.1 06/18/2017   CL 103 06/18/2017   CO2 26 06/18/2017  No results found for: HGBA1C -fasting glucose was 121 in June.  Recent Labs: November 04, 2017 -from nephrology evaluation  Na+ 141, K+ 4.3, Cl- 103, HCO3- 27, BUN 49, Cr 1.64, Glu 121, Ca2+ 10.5; AST 17, ALT 13, AlkP 83,  T Bili 0.4, TP 7.6,Alb 4.3  CBC: W 8.6, H/H 12.2/35.6, Plt 353  Noted to have microscopic hematuria, presumably from malignant hypertension   ASSESSMENT / PLAN: Problem List Items Addressed This Visit    [place] cardiac risk 10-20% in next 10 years (Chronic)    Currently  blood pressureand lipids appear well controlled. Would recommend treatment for prediabetes of concern.      H/O hypokalemia (Chronic)    Concerned that he has hypokalemia with difficult to control hypertension.  Concern for possible hyperaldosteronism.  He is therefore on high-dose spironolactone. Suspect that if we reduce or discontinue chlorthalidone, his potassium supplementation may improve.      Hyperlipidemia with target LDL less than 100 (Chronic)    Labs look great as of June.  Due to get his labs followed by PCP with fasting lipid panel.  He is on 10 mg atorvastatin      Hypertensive heart disease without heart failure - Primary (Chronic)    Moderate concentric LVH with grade 2 diastolic dysfunction on echocardiogram but no heart failure symptoms.  He is on stable dose of beta-blocker (Bystolic) and spironolactone.  He is now on high-dose amlodipine. Currently on chlorthalidone and spironolactone plus low-dose losartan.  I suspect that we may need to reduce or stop chlorthalidone and increase losartan.      Pain and swelling of ankle, right    He has painful, warm swelling in the right ankle I am concerned that he may have gout versus pseudogout.  He is due to see his PCP tomorrow, as part of evaluation for prediabetes versus diabetes based on his hyperglycemia.  Would consider checking uric acid levels.  If there is clearly a suspicion for gout, my recommendation would be to wean off of chlorthalidone and titrate up losartan.  Avoid NSAIDs except for occasional as needed, otherwise would use Tylenol ES.      Resistant hypertension (Chronic)    Blood pressures well controlled today.  He has been seen now by Dr. Abel Presto from nephrology and will be following up with him soon.  If necessary to reduce/discontinue chlorthalidone, we still have room to titrate up losartan.       - Hyperglycemia / Borderline DM-2 -due to see PCP tomorrow. - ?  Gout/pseudogout -recommend PCP  check uric acid studies.  Current medicines are reviewed at length with the patient today. (+/- concerns) n/a The following changes have been made:May need to consider stopping chlorthalidone and increasing ARB.  Patient Instructions  No change to medications     Recommend you to talk to your primary tomorrow about - possible  gout in right ankle.    Your physician wants you to follow-up in 6 months with Dr Herbie BaltimoreHarding. You will receive a reminder letter in the mail two months in advance. If you don't receive a letter, please call our office to schedule the follow-up appointment.   If you need a refill on your cardiac medications before your next appointment, please call your pharmacy.    Studies Ordered:   No orders of the defined types were placed in this encounter.     Bryan Lemmaavid Harding, M.D., M.S. Interventional Cardiologist   Pager # [phone] Phone # [phone] [address] [place], [place] [address]

## 2017-12-09 NOTE — Assessment & Plan Note (Signed)
Labs look great as of June.  Due to get his labs followed by PCP with fasting lipid panel.  He is on 10 mg atorvastatin

## 2017-12-09 NOTE — Assessment & Plan Note (Signed)
Concerned that he has hypokalemia with difficult to control hypertension.  Concern for possible hyperaldosteronism.  He is therefore on high-dose spironolactone. Suspect that if we reduce or discontinue chlorthalidone, his potassium supplementation may improve.

## 2017-12-09 NOTE — Assessment & Plan Note (Signed)
He has painful, warm swelling in the right ankle I am concerned that he may have gout versus pseudogout.  He is due to see his PCP tomorrow, as part of evaluation for prediabetes versus diabetes based on his hyperglycemia.  Would consider checking uric acid levels.  If there is clearly a suspicion for gout, my recommendation would be to wean off of chlorthalidone and titrate up losartan.  Avoid NSAIDs except for occasional as needed, otherwise would use Tylenol ES.

## 2017-12-09 NOTE — Assessment & Plan Note (Signed)
Blood pressures well controlled today.  He has been seen now by Dr. Abel Prestoolodonato from nephrology and will be following up with him soon.  If necessary to reduce/discontinue chlorthalidone, we still have room to titrate up losartan.

## 2017-12-09 NOTE — Patient Instructions (Addendum)
No change to medications     Recommend you to talk to your primary tomorrow about - possible gout in right ankle.    Your physician wants you to follow-up in 6 months with Dr Herbie BaltimoreHarding. You will receive a reminder letter in the mail two months in advance. If you don't receive a letter, please call our office to schedule the follow-up appointment.   If you need a refill on your cardiac medications before your next appointment, please call your pharmacy.

## 2017-12-09 NOTE — Assessment & Plan Note (Signed)
Currently blood pressureand lipids appear well controlled. Would recommend treatment for prediabetes of concern.

## 2017-12-09 NOTE — Assessment & Plan Note (Signed)
Moderate concentric LVH with grade 2 diastolic dysfunction on echocardiogram but no heart failure symptoms.  He is on stable dose of beta-blocker (Bystolic) and spironolactone.  He is now on high-dose amlodipine. Currently on chlorthalidone and spironolactone plus low-dose losartan.  I suspect that we may need to reduce or stop chlorthalidone and increase losartan.

## 2017-12-10 DIAGNOSIS — M25571 Pain in right ankle and joints of right foot: Secondary | ICD-10-CM | POA: Diagnosis not present

## 2017-12-10 DIAGNOSIS — E119 Type 2 diabetes mellitus without complications: Secondary | ICD-10-CM | POA: Diagnosis not present

## 2018-01-01 ENCOUNTER — Other Ambulatory Visit: Payer: Self-pay

## 2018-01-01 ENCOUNTER — Ambulatory Visit (INDEPENDENT_AMBULATORY_CARE_PROVIDER_SITE_OTHER): Payer: BLUE CROSS/BLUE SHIELD

## 2018-01-01 ENCOUNTER — Other Ambulatory Visit: Payer: Self-pay | Admitting: Family Medicine

## 2018-01-01 ENCOUNTER — Ambulatory Visit (INDEPENDENT_AMBULATORY_CARE_PROVIDER_SITE_OTHER): Payer: BLUE CROSS/BLUE SHIELD | Admitting: Family Medicine

## 2018-01-01 ENCOUNTER — Encounter: Payer: Self-pay | Admitting: Family Medicine

## 2018-01-01 VITALS — BP 130/82 | HR 86 | Temp 98.0°F | Resp 16 | Ht 75.2 in | Wt 264.0 lb

## 2018-01-01 DIAGNOSIS — M25422 Effusion, left elbow: Secondary | ICD-10-CM | POA: Diagnosis not present

## 2018-01-01 DIAGNOSIS — M109 Gout, unspecified: Secondary | ICD-10-CM

## 2018-01-01 DIAGNOSIS — M25522 Pain in left elbow: Secondary | ICD-10-CM | POA: Diagnosis not present

## 2018-01-01 DIAGNOSIS — M7989 Other specified soft tissue disorders: Secondary | ICD-10-CM | POA: Diagnosis not present

## 2018-01-01 MED ORDER — INDOMETHACIN 50 MG PO CAPS: 50.0000 mg | ORAL_CAPSULE | Freq: Two times a day (BID) | ORAL | 1 refills | Status: DC

## 2018-01-01 NOTE — Progress Notes (Signed)
1/4/201911:19 AM  Kristopher Mcalpine Sr. 1961-12-16, 57 y.o. male [id number]  Chief Complaint  Patient presents with  . Elbow Pain    left elbow x 3 days     HPI:   Patient is a 57 y.o. male with past medical history significant for HTN who presents today for 3-4 days of left elbow pain and swelling, he denies any trauma. Unable to fully extend or bend elbow. Has had similar presentation before. Denies ever been told he has gout. He denies any fever or chills, any redness. He does state it has been feeling warm. He took ibuprofen 800mg  last night and today a bit better. Patient is left handed.   Depression screen [facility] 2/9 01/01/2018 11/07/2016  Decreased Interest 0 0  Down, Depressed, Hopeless 0 0  PHQ - 2 Score 0 0    No Known Allergies  Prior to Admission medications   Medication Sig Start Date End Date Taking? Authorizing Provider  amLODipine (NORVASC) 10 MG tablet Take 10 mg by mouth daily. 09/01/17  Yes [provider]  atorvastatin (LIPITOR) 10 MG tablet Take 10 mg by mouth daily. 03/30/17  Yes [provider]  chlorthalidone (HYGROTON) 25 MG tablet TAKE 1 TABLET BY MOUTH EVERY DAY 10/22/17  Yes Marykay Lex, MD  Nebivolol HCl (BYSTOLIC) 20 MG TABS Take 1 tablet (20 mg total) by mouth daily. 09/14/17  Yes Marykay Lex, MD  potassium chloride (MICRO-K) 10 MEQ CR capsule  09/19/17  Yes [provider]  spironolactone (ALDACTONE) 50 MG tablet TAKE 1 TABLET BY MOUTH EVERY DAY 10/22/17  Yes Marykay Lex, MD  valsartan (DIOVAN) 160 MG tablet  08/19/17  Yes [provider]  losartan (COZAAR) 25 MG tablet Take 1 tablet (25 mg total) by mouth daily. 09/17/17 12/16/17  Azalee Course, PA    Past Medical History:  Diagnosis Date  . Arthritis   . Hypertension     Past Surgical History:  Procedure Laterality Date  . HAND SURGERY    . KNEE ARTHROSCOPY Left   . RENAL ARTERY DOPPLERS  05/29/2017    Normal Renal A Bilaterally.  L > R kidney (3.2  cm) . Renal Cysts noted  - larges ~3.7 cm x 3.5 cm (R kidney), L kidney - ~1.7 cm x 1.6 cm -> recommend referral to Nephrology  . TRANSTHORACIC ECHOCARDIOGRAM  05/2017   Normal Echo. Mod Conc LVH. EF 55-60%.  No RWMA. Normal Valves. Gr 1 DD.    Social History   Tobacco Use  . Smoking status: Never Smoker  . Smokeless tobacco: Never Used  Substance Use Topics  . Alcohol use: Yes    Comment: occasional    Family History  Problem Relation Age of Onset  . Cancer Mother   . Cancer Father   . Cancer Sister   . Hypertension Sister   . Cancer Brother   . Hypertension Brother   . Cancer Sister   . Hypertension Sister     ROS Per hpi  OBJECTIVE:  Blood pressure 130/82, pulse 86, temperature 98 F (36.7 C), temperature source Oral, resp. rate 16, height 6' 3.2" (1.91 m), weight 264 lb (119.7 kg), SpO2 97 %.  Physical Exam  Constitutional: He is oriented to person, place, and time and well-developed, well-nourished, and in no distress.  HENT:  Head: Normocephalic and atraumatic.  Mouth/Throat: Oropharynx is clear and moist.  Eyes: EOM are normal. Pupils are equal, round, and reactive to light.  Neck: Neck supple.  Pulmonary/Chest: Effort normal.  Musculoskeletal:       Left elbow: He exhibits decreased range of motion and swelling. He exhibits no effusion and no laceration. Tenderness found.  Neurological: He is alert and oriented to person, place, and time. Gait normal.  Skin: Skin is warm and dry.  Psychiatric: Mood and affect normal.  Nursing note and vitals reviewed.   Uric acid in 10/2016 - 9.5   Dg Elbow Complete Left (3+view)  Result Date: 01/01/2018 CLINICAL DATA:  Acute left elbow pain and swelling without known injury. EXAM: LEFT ELBOW - COMPLETE 3+ VIEW COMPARISON:  Radiographs of November 07, 2016. FINDINGS: No fracture or dislocation is noted. Abnormal posterior fat pad displacement is noted suggesting underlying joint effusion. Osteophyte formation is noted  consistent with degenerative change. IMPRESSION: Abnormal posterior fat pad displacement is noted suggesting underlying joint effusion. Degenerative changes are again noted. No fracture or dislocation is noted. Electronically Signed   By: Lupita RaiderJames  Green Jr, M.D.   On: 01/01/2018 11:46     ASSESSMENT and PLAN 1. Pain and swelling of left elbow 2. Acute gout of left elbow, unspecified cause Discussed supportive measures, new meds r/se/b and RTC precautions. Patient educational handout given. - indomethacin (INDOCIN) 50 MG capsule; Take 1 capsule (50 mg total) by mouth 2 (two) times daily with a meal.   Return if symptoms worsen or fail to improve.    Myles LippsIrma M Santiago, MD [facility] [address] [place], [place] [address] Ph.  [phone] Fax [phone]

## 2018-01-01 NOTE — Patient Instructions (Addendum)
   IF you received an x-ray today, you will receive an invoice from [facility]. Please contact [facility] at [phone] with questions or concerns regarding your invoice.   IF you received labwork today, you will receive an invoice from [facility]. Please contact [facility] at [phone] with questions or concerns regarding your invoice.   Our billing staff will not be able to assist you with questions regarding bills from these companies.  You will be contacted with the lab results as soon as they are available. The fastest way to get your results is to activate your My Chart account. Instructions are located on the last page of this paperwork. If you have not heard from us regarding the results in 2 weeks, please contact this office.     Gout Gout is painful swelling that can occur in some of your joints. Gout is a type of arthritis. This condition is caused by having too much uric acid in your body. Uric acid is a chemical that forms when your body breaks down substances called purines. Purines are important for building body proteins. When your body has too much uric acid, sharp crystals can form and build up inside your joints. This causes pain and swelling. Gout attacks can happen quickly and be very painful (acute gout). Over time, the attacks can affect more joints and become more frequent (chronic gout). Gout can also cause uric acid to build up under your skin and inside your kidneys. What are the causes? This condition is caused by too much uric acid in your blood. This can occur because:  Your kidneys do not remove enough uric acid from your blood. This is the most common cause.  Your body makes too much uric acid. This can occur with some cancers and cancer treatments. It can also occur if your body is breaking down too many red blood cells (hemolytic anemia).  You eat too many foods that are high in purines. These foods include organ meats and some seafood.  Alcohol, especially beer, is also high in purines.  A gout attack may be triggered by trauma or stress. What increases the risk? This condition is more likely to develop in people who:  Have a family history of gout.  Are male and middle-aged.  Are male and have gone through menopause.  Are obese.  Frequently drink alcohol, especially beer.  Are dehydrated.  Lose weight too quickly.  Have an organ transplant.  Have lead poisoning.  Take certain medicines, including aspirin, cyclosporine, diuretics, levodopa, and niacin.  Have kidney disease or psoriasis.  What are the signs or symptoms? An attack of acute gout happens quickly. It usually occurs in just one joint. The most common place is the big toe. Attacks often start at night. Other joints that may be affected include joints of the feet, ankle, knee, fingers, wrist, or elbow. Symptoms may include:  Severe pain.  Warmth.  Swelling.  Stiffness.  Tenderness. The affected joint may be very painful to touch.  Shiny, red, or purple skin.  Chills and fever.  Chronic gout may cause symptoms more frequently. More joints may be involved. You may also have white or yellow lumps (tophi) on your hands or feet or in other areas near your joints. How is this diagnosed? This condition is diagnosed based on your symptoms, medical history, and physical exam. You may have tests, such as:  Blood tests to measure uric acid levels.  Removal of joint fluid with a needle (aspiration) to   look for uric acid crystals.  X-rays to look for joint damage.  How is this treated? Treatment for this condition has two phases: treating an acute attack and preventing future attacks. Acute gout treatment may include medicines to reduce pain and swelling, including:  NSAIDs.  Steroids. These are strong anti-inflammatory medicines that can be taken by mouth (orally) or injected into a joint.  Colchicine. This medicine relieves pain and  swelling when it is taken soon after an attack. It can be given orally or through an IV tube.  Preventive treatment may include:  Daily use of smaller doses of NSAIDs or colchicine.  Use of a medicine that reduces uric acid levels in your blood.  Changes to your diet. You may need to see a specialist about healthy eating (dietitian).  Follow these instructions at home: During a Gout Attack  If directed, apply ice to the affected area: ? Put ice in a plastic bag. ? Place a towel between your skin and the bag. ? Leave the ice on for 20 minutes, 2-3 times a day.  Rest the joint as much as possible. If the affected joint is in your leg, you may be given crutches to use.  Raise (elevate) the affected joint above the level of your heart as often as possible.  Drink enough fluids to keep your urine clear or pale yellow.  Take over-the-counter and prescription medicines only as told by your health care provider.  Do not drive or operate heavy machinery while taking prescription pain medicine.  Follow instructions from your health care provider about eating or drinking restrictions.  Return to your normal activities as told by your health care provider. Ask your health care provider what activities are safe for you. Avoiding Future Gout Attacks  Follow a low-purine diet as told by your dietitian or health care provider. Avoid foods and drinks that are high in purines, including liver, kidney, anchovies, asparagus, herring, mushrooms, mussels, and beer.  Limit alcohol intake to no more than 1 drink a day for nonpregnant women and 2 drinks a day for men. One drink equals 12 oz of beer, 5 oz of wine, or 1 oz of hard liquor.  Maintain a healthy weight or lose weight if you are overweight. If you want to lose weight, talk with your health care provider. It is important that you do not lose weight too quickly.  Start or maintain an exercise program as told by your health care  provider.  Drink enough fluids to keep your urine clear or pale yellow.  Take over-the-counter and prescription medicines only as told by your health care provider.  Keep all follow-up visits as told by your health care provider. This is important. Contact a health care provider if:  You have another gout attack.  You continue to have symptoms of a gout attack after10 days of treatment.  You have side effects from your medicines.  You have chills or a fever.  You have burning pain when you urinate.  You have pain in your lower back or belly. Get help right away if:  You have severe or uncontrolled pain.  You cannot urinate. This information is not intended to replace advice given to you by your health care provider. Make sure you discuss any questions you have with your health care provider. Document Released: 12/12/2000 Document Revised: 05/22/2016 Document Reviewed: 09/27/2015 Elsevier Interactive Patient Education  2018 [employer]vier Inc.  

## 2018-01-11 ENCOUNTER — Telehealth (INDEPENDENT_AMBULATORY_CARE_PROVIDER_SITE_OTHER): Payer: Self-pay | Admitting: Orthopedic Surgery

## 2018-01-11 NOTE — Telephone Encounter (Signed)
Patient called asking if he was due for another knee injection? CB# [phone]

## 2018-01-11 NOTE — Telephone Encounter (Signed)
I tried calling patient to discuss. Looks like he last had bilateral knee cortisone injections of 01/2017 then was re-evaluated 08/2017 and was suggested for BV of synvisc one injections in knees. I asked him to call me to discuss also b/c it looks like most recently had been seeing Erin. Need clarification on who he wants to do injections and whether or not he wants cortisone or for us to follow up with synvisc. Also need to verify insurance for him.

## 2018-01-22 DIAGNOSIS — R195 Other fecal abnormalities: Secondary | ICD-10-CM | POA: Diagnosis not present

## 2018-01-22 DIAGNOSIS — D649 Anemia, unspecified: Secondary | ICD-10-CM | POA: Diagnosis not present

## 2018-01-23 ENCOUNTER — Other Ambulatory Visit: Payer: Self-pay | Admitting: Cardiology

## 2018-01-25 NOTE — Telephone Encounter (Signed)
Rx request sent to pharmacy.  

## 2018-02-19 DIAGNOSIS — K648 Other hemorrhoids: Secondary | ICD-10-CM | POA: Diagnosis not present

## 2018-02-19 DIAGNOSIS — D175 Benign lipomatous neoplasm of intra-abdominal organs: Secondary | ICD-10-CM | POA: Diagnosis not present

## 2018-02-19 DIAGNOSIS — D500 Iron deficiency anemia secondary to blood loss (chronic): Secondary | ICD-10-CM | POA: Diagnosis not present

## 2018-02-19 DIAGNOSIS — D1779 Benign lipomatous neoplasm of other sites: Secondary | ICD-10-CM | POA: Diagnosis not present

## 2018-02-19 DIAGNOSIS — Z1211 Encounter for screening for malignant neoplasm of colon: Secondary | ICD-10-CM | POA: Diagnosis not present

## 2018-02-19 DIAGNOSIS — K621 Rectal polyp: Secondary | ICD-10-CM | POA: Diagnosis not present

## 2018-02-19 DIAGNOSIS — D126 Benign neoplasm of colon, unspecified: Secondary | ICD-10-CM | POA: Diagnosis not present

## 2018-02-19 DIAGNOSIS — D123 Benign neoplasm of transverse colon: Secondary | ICD-10-CM | POA: Diagnosis not present

## 2018-02-19 DIAGNOSIS — D179 Benign lipomatous neoplasm, unspecified: Secondary | ICD-10-CM | POA: Diagnosis not present

## 2018-02-19 DIAGNOSIS — K5730 Diverticulosis of large intestine without perforation or abscess without bleeding: Secondary | ICD-10-CM | POA: Diagnosis not present

## 2018-02-19 DIAGNOSIS — R195 Other fecal abnormalities: Secondary | ICD-10-CM | POA: Diagnosis not present

## 2018-03-02 ENCOUNTER — Encounter (INDEPENDENT_AMBULATORY_CARE_PROVIDER_SITE_OTHER): Payer: Self-pay | Admitting: Orthopedic Surgery

## 2018-03-02 ENCOUNTER — Ambulatory Visit (INDEPENDENT_AMBULATORY_CARE_PROVIDER_SITE_OTHER): Payer: BLUE CROSS/BLUE SHIELD | Admitting: Orthopedic Surgery

## 2018-03-02 VITALS — Ht 75.0 in | Wt 264.0 lb

## 2018-03-02 DIAGNOSIS — M170 Bilateral primary osteoarthritis of knee: Secondary | ICD-10-CM

## 2018-03-02 NOTE — Progress Notes (Signed)
Office Visit Note   Patient: Kristopher LINFORD Sr.           Date of Birth: Apr 30, 1961           MRN: [id number] Visit Date: 03/02/2018              Requested by: Daylene Katayama, PA [address] [place], [place] [address] PCP: Daylene Katayama, PA  Chief Complaint  Patient presents with  . Left Knee - Pain  . Right Knee - Pain      HPI: Patient presents for follow-up for osteoarthritis both knees left worse than right.  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  Patient has had temporary relief with previous steroid injections and inquires about hyaluronic acid injections.  Assessment & Plan: Visit Diagnoses:  1. Primary osteoarthritis of both knees     Plan: Discussed with the patient we could proceed with a injection for both knees with Depo-Medrol today to provide him temporary relief.  With the advanced arthritis of both knees his only option is a total knee arthroplasty.  Discussed that he should be returned to work in about 3 months after surgery.  We will plan on left total knee arthroplasty first.  Patient states he would like to consider surgery this summer.  Follow-Up Instructions: Return if symptoms worsen or fail to improve.   Ortho Exam  Patient is alert, oriented, no adenopathy, well-dressed, normal affect, normal respiratory effort. Examination patient has an antalgic gait with varus alignment of both knees.  He has crepitation with range of motion of both knees Clauser cruciates are stable he has palpable large loose bodies in the left knee with a large effusion.  There is no redness no cellulitis no signs of infection.  Imaging: No results found. No images are attached to the encounter.  Labs: Lab Results  Component Value Date   ESRSEDRATE 25 (H) 02/10/2016   LABURIC 9.5 (H) 11/07/2016   REPTSTATUS 02/14/2016 FINAL 02/10/2016   GRAMSTAIN  02/10/2016    NO ORGANISMS  SEEN WBC PRESENT, PREDOMINANTLY PMN Gram Stain Report Called to,Read Back By and Verified With: S THORNTON AT 1124 ON 02.12.2017 BY NBROOKS    CULT  02/10/2016    NO GROWTH 3 DAYS Performed at [facility]     [email or web address] [facility]  Body mass index is 33 kg/m.  Orders:  Orders Placed This Encounter  Procedures  . Large Joint Inj   No orders of the defined types were placed in this encounter.    Procedures: Large Joint Inj: bilateral knee on 03/02/2018 10:02 AM Indications: pain and diagnostic evaluation Details: 22 G 1.5 in needle  Arthrogram: No  Outcome: tolerated well, no immediate complications Procedure, treatment alternatives, risks and benefits explained, specific risks discussed. Consent was given by the patient. Immediately prior to procedure a time out was called to verify the correct patient, procedure, equipment, support staff and site/side marked as required. Patient was prepped and draped in the usual sterile fashion.      Clinical Data: No additional findings.  ROS:  All other systems negative, except as noted in the HPI. Review of Systems  Objective: Vital Signs: Ht 6\' 3"  (1.905 m)   Wt 264 lb (119.7 kg)   BMI 33.00 kg/m   Specialty Comments:  No specialty comments available.  PMFS History: Patient Active Problem List   Diagnosis Date Noted  . Pain and  swelling of ankle, right 12/09/2017  . Hyperlipidemia with target LDL less than 100 06/18/2017  . Hypertensive heart disease without heart failure 06/18/2017  . Bilateral renal cysts 06/18/2017  . Resistant hypertension 05/18/2017  . H/O hypokalemia 05/18/2017  . Framingham cardiac risk 10-20% in next 10 years 03/17/2017  . Diverticulosis of large intestine without hemorrhage 09/09/2016  . Benign essential hypertension 07/15/2016  . Acute bilateral ankle pain 09/04/2015  . Closed nondisplaced fracture of navicular bone of left foot 09/04/2015  . Pain in both feet 09/04/2015  .  Primary osteoarthritis of both knees 05/30/2015  . Arthralgia of both knees 01/05/2015  . Arthralgia of left hip 01/05/2015  . Abnormal EKG 06/18/2014  . Knee pain 03/28/2014   Past Medical History:  Diagnosis Date  . Arthritis   . Hypertension     Family History  Problem Relation Age of Onset  . Cancer Mother   . Cancer Father   . Cancer Sister   . Hypertension Sister   . Cancer Brother   . Hypertension Brother   . Cancer Sister   . Hypertension Sister     Past Surgical History:  Procedure Laterality Date  . HAND SURGERY    . KNEE ARTHROSCOPY Left   . RENAL ARTERY DOPPLERS  05/29/2017    Normal Renal A Bilaterally.  L > R kidney (3.2 cm) . Renal Cysts noted  - larges ~3.7 cm x 3.5 cm (R kidney), L kidney - ~1.7 cm x 1.6 cm -> recommend referral to Nephrology  . TRANSTHORACIC ECHOCARDIOGRAM  05/2017   Normal Echo. Mod Conc LVH. EF 55-60%.  No RWMA. Normal Valves. Gr 1 DD.   Social History   Occupational History  . Not on file  Tobacco Use  . Smoking status: Never Smoker  . Smokeless tobacco: Never Used  Substance and Sexual Activity  . Alcohol use: Yes    Comment: occasional  . Drug use: No  . Sexual activity: Not on file

## 2018-04-13 DIAGNOSIS — N281 Cyst of kidney, acquired: Secondary | ICD-10-CM | POA: Diagnosis not present

## 2018-04-13 DIAGNOSIS — N182 Chronic kidney disease, stage 2 (mild): Secondary | ICD-10-CM | POA: Diagnosis not present

## 2018-04-13 DIAGNOSIS — E876 Hypokalemia: Secondary | ICD-10-CM | POA: Diagnosis not present

## 2018-04-13 DIAGNOSIS — I10 Essential (primary) hypertension: Secondary | ICD-10-CM | POA: Diagnosis not present

## 2018-04-28 ENCOUNTER — Encounter (HOSPITAL_COMMUNITY): Payer: Self-pay | Admitting: Emergency Medicine

## 2018-04-28 ENCOUNTER — Emergency Department (HOSPITAL_COMMUNITY): Payer: BLUE CROSS/BLUE SHIELD

## 2018-04-28 ENCOUNTER — Emergency Department (HOSPITAL_COMMUNITY)
Admission: EM | Admit: 2018-04-28 | Discharge: 2018-04-28 | Disposition: A | Discharge status: Home / Self Care | Payer: BLUE CROSS/BLUE SHIELD | Attending: Emergency Medicine | Admitting: Emergency Medicine

## 2018-04-28 DIAGNOSIS — R9431 Abnormal electrocardiogram [ECG] [EKG]: Secondary | ICD-10-CM | POA: Diagnosis not present

## 2018-04-28 DIAGNOSIS — I119 Hypertensive heart disease without heart failure: Secondary | ICD-10-CM | POA: Diagnosis not present

## 2018-04-28 DIAGNOSIS — Z79899 Other long term (current) drug therapy: Secondary | ICD-10-CM | POA: Diagnosis not present

## 2018-04-28 DIAGNOSIS — R079 Chest pain, unspecified: Secondary | ICD-10-CM | POA: Diagnosis not present

## 2018-04-28 DIAGNOSIS — I517 Cardiomegaly: Secondary | ICD-10-CM | POA: Diagnosis not present

## 2018-04-28 DIAGNOSIS — R072 Precordial pain: Secondary | ICD-10-CM | POA: Diagnosis not present

## 2018-04-28 DIAGNOSIS — Z7982 Long term (current) use of aspirin: Secondary | ICD-10-CM | POA: Diagnosis not present

## 2018-04-28 HISTORY — DX: Hyperlipidemia, unspecified: E78.5

## 2018-04-28 LAB — BASIC METABOLIC PANEL
Anion gap: 10 (ref 5–15)
BUN: 38 mg/dL — AB (ref 6–20)
CO2: 26 mmol/L (ref 22–32)
CREATININE: 1.44 mg/dL — AB (ref 0.61–1.24)
Calcium: 10.1 mg/dL (ref 8.9–10.3)
Chloride: 104 mmol/L (ref 101–111)
GFR calc Af Amer: >60 mL/min (ref >60)
GFR calc non Af Amer: 52 mL/min — ABNORMAL LOW (ref >60)
GLUCOSE: 123 mg/dL — AB (ref 65–99)
Potassium: 4.5 mmol/L (ref 3.5–5.1)
Sodium: 140 mmol/L (ref 135–145)

## 2018-04-28 LAB — I-STAT TROPONIN, ED: Troponin i, poc: 0.01 ng/mL (ref 0.00–0.08)

## 2018-04-28 LAB — CBC
HCT: 39.8 % (ref 39.0–52.0)
Hemoglobin: 12.7 g/dL — ABNORMAL LOW (ref 13.0–17.0)
MCH: 28 pg (ref 26.0–34.0)
MCHC: 31.9 g/dL (ref 30.0–36.0)
MCV: 87.9 fL (ref 78.0–100.0)
PLATELETS: 350 10*3/uL (ref 150–400)
RBC: 4.53 MIL/uL (ref 4.22–5.81)
RDW: 13.7 % (ref 11.5–15.5)
WBC: 8.8 10*3/uL (ref 4.0–10.5)

## 2018-04-28 NOTE — ED Provider Notes (Signed)
[facility] EMERGENCY DEPARTMENT Provider Note   CSN: [id number] Arrival date & time: 04/28/18  1212     History   Chief Complaint Chief Complaint  Patient presents with  . Chest Pain    HPI Kristopher SMITHEY Sr. is a 57 y.o. male.  Patient c/o mid chest pain for past 2 days. Occurs at rest. Constant. Dull. Non radiating. States when he is up and about it feels better/he forgets about it. Not worse with exertion. Not pleuritic. No associated sob, nv or diaphoresis. Pt states he feels he may have strained a muscle as he is always using his arms to pull himself up. Pt denies gerd or heartburn. Denies leg pain or swelling. No hx dvt or pe. No fam hx premature cad. Denies any unusual doe or fatigue. Denies cough or uri symptoms. No fever or chills.   The history is provided by the patient.  Chest Pain   Pertinent negatives include no abdominal pain, no back pain, no fever, no headaches and no shortness of breath.    Past Medical History:  Diagnosis Date  . Arthritis   . Hyperlipidemia   . Hypertension     Patient Active Problem List   Diagnosis Date Noted  . Pain and swelling of ankle, right 12/09/2017  . Hyperlipidemia with target LDL less than 100 06/18/2017  . Hypertensive heart disease without heart failure 06/18/2017  . Bilateral renal cysts 06/18/2017  . Resistant hypertension 05/18/2017  . H/O hypokalemia 05/18/2017  . Framingham cardiac risk 10-20% in next 10 years 03/17/2017  . Diverticulosis of large intestine without hemorrhage 09/09/2016  . Benign essential hypertension 07/15/2016  . Acute bilateral ankle pain 09/04/2015  . Closed nondisplaced fracture of navicular bone of left foot 09/04/2015  . Pain in both feet 09/04/2015  . Primary osteoarthritis of both knees 05/30/2015  . Arthralgia of both knees 01/05/2015  . Arthralgia of left hip 01/05/2015  . Abnormal EKG 06/18/2014  . Knee pain 03/28/2014    Past Surgical History:  Procedure  Laterality Date  . HAND SURGERY    . KNEE ARTHROSCOPY Left   . RENAL ARTERY DOPPLERS  05/29/2017    Normal Renal A Bilaterally.  L > R kidney (3.2 cm) . Renal Cysts noted  - larges ~3.7 cm x 3.5 cm (R kidney), L kidney - ~1.7 cm x 1.6 cm -> recommend referral to Nephrology  . TRANSTHORACIC ECHOCARDIOGRAM  05/2017   Normal Echo. Mod Conc LVH. EF 55-60%.  No RWMA. Normal Valves. Gr 1 DD.        Home Medications    Prior to Admission medications   Medication Sig Start Date End Date Taking? Authorizing Provider  amLODipine (NORVASC) 10 MG tablet Take 10 mg by mouth daily. 09/01/17   [provider]  atorvastatin (LIPITOR) 10 MG tablet Take 10 mg by mouth daily. 03/30/17   [provider]  chlorthalidone (HYGROTON) 25 MG tablet TAKE 1 TABLET BY MOUTH EVERY DAY 01/25/18   Marykay Lex, MD  indomethacin (INDOCIN) 50 MG capsule Take 1 capsule (50 mg total) by mouth 2 (two) times daily with a meal. 01/01/18   Myles Lipps, MD  losartan (COZAAR) 25 MG tablet Take 1 tablet (25 mg total) by mouth daily. 09/17/17 12/16/17  Azalee Course, PA  Nebivolol HCl (BYSTOLIC) 20 MG TABS Take 1 tablet (20 mg total) by mouth daily. 09/14/17   Marykay Lex, MD  potassium chloride (MICRO-K) 10 MEQ CR capsule  09/19/17   [provider]  spironolactone (ALDACTONE) 50 MG tablet TAKE 1 TABLET BY MOUTH EVERY DAY 01/25/18   Marykay Lex, MD  valsartan (DIOVAN) 160 MG tablet  08/19/17   [provider]    Family History Family History  Problem Relation Age of Onset  . Cancer Mother   . Cancer Father   . Cancer Sister   . Hypertension Sister   . Cancer Brother   . Hypertension Brother   . Cancer Sister   . Hypertension Sister     Social History Social History   Tobacco Use  . Smoking status: Never Smoker  . Smokeless tobacco: Never Used  Substance Use Topics  . Alcohol use: Yes    Comment: occasional  . Drug use: No     Allergies   Patient has no known  allergies.   Review of Systems Review of Systems  Constitutional: Negative for fever.  HENT: Negative for sore throat.   Eyes: Negative for redness.  Respiratory: Negative for shortness of breath.   Cardiovascular: Positive for chest pain. Negative for leg swelling.  Gastrointestinal: Negative for abdominal pain.  Genitourinary: Negative for flank pain.  Musculoskeletal: Negative for back pain and neck pain.  Skin: Negative for rash.  Neurological: Negative for headaches.  Hematological: Does not bruise/bleed easily.  Psychiatric/Behavioral: Negative for confusion.     Physical Exam Updated Vital Signs SpO2 97%   Physical Exam  Constitutional: He is oriented to person, place, and time. He appears well-developed and well-nourished. No distress.  HENT:  Mouth/Throat: Oropharynx is clear and moist.  Eyes: Conjunctivae are normal.  Neck: Neck supple. No tracheal deviation present.  Cardiovascular: Normal rate, regular rhythm, normal heart sounds and intact distal pulses. Exam reveals no gallop and no friction rub.  No murmur heard. Pulmonary/Chest: Effort normal and breath sounds normal. No accessory muscle usage. No respiratory distress. He exhibits tenderness.  Abdominal: Soft. Bowel sounds are normal. He exhibits no distension. There is no tenderness.  Musculoskeletal: He exhibits no edema or tenderness.  Neurological: He is alert and oriented to person, place, and time.  Skin: Skin is warm and dry. He is not diaphoretic.  Psychiatric: He has a normal mood and affect.  Nursing note and vitals reviewed.    ED Treatments / Results  Labs (all labs ordered are listed, but only abnormal results are displayed) Results for orders placed or performed during the hospital encounter of 04/28/18  Basic metabolic panel  Result Value Ref Range   Sodium 140 135 - 145 mmol/L   Potassium 4.5 3.5 - 5.1 mmol/L   Chloride 104 101 - 111 mmol/L   CO2 26 22 - 32 mmol/L   Glucose, Bld 123  (H) 65 - 99 mg/dL   BUN 38 (H) 6 - 20 mg/dL   Creatinine, Ser 1.61 (H) 0.61 - 1.24 mg/dL   Calcium 09.6 8.9 - 04.5 mg/dL   GFR calc non Af Amer 52 (L) >60 mL/min   GFR calc Af Amer >60 >60 mL/min   Anion gap 10 5 - 15  CBC  Result Value Ref Range   WBC 8.8 4.0 - 10.5 K/uL   RBC 4.53 4.22 - 5.81 MIL/uL   Hemoglobin 12.7 (L) 13.0 - 17.0 g/dL   HCT 40.9 81.1 - 91.4 %   MCV 87.9 78.0 - 100.0 fL   MCH 28.0 26.0 - 34.0 pg   MCHC 31.9 30.0 - 36.0 g/dL   RDW 78.2 95.6 - 21.3 %  Platelets 350 150 - 400 K/uL  I-stat troponin, ED  Result Value Ref Range   Troponin i, poc 0.01 0.00 - 0.08 ng/mL   Comment 3           EKG EKG Interpretation  Date/Time:  Wednesday Apr 28 2018 12:41:04 EDT Ventricular Rate:  58 PR Interval:    QRS Duration: 110 QT Interval:  411 QTC Calculation: 404 R Axis:   14 Text Interpretation:  Sinus rhythm Non-specific ST-t changes Confirmed by Cathren Laine ([id number]) on 04/28/2018 12:49:23 PM   Radiology Dg Chest 2 View  Result Date: 04/28/2018 CLINICAL DATA:  Chest pain. EXAM: CHEST - 2 VIEW COMPARISON:  12/19/2005 FINDINGS: Borderline cardiomegaly. Slight tortuosity of the thoracic aorta. Pulmonary vascularity is normal. Slight chronic accentuation of the interstitial markings at the lung bases. No acute infiltrates or effusions. No bone abnormality. IMPRESSION: No acute abnormality. Borderline cardiomegaly. Minimal chronic interstitial changes at the bases. Electronically Signed   By: Francene Boyers M.D.   On: 04/28/2018 13:11    Procedures Procedures (including critical care time)  Medications Ordered in ED Medications - No data to display   Initial Impression / Assessment and Plan / ED Course  I have reviewed the triage vital signs and the nursing notes.  Pertinent labs & imaging results that were available during my care of the patient were reviewed by me and considered in my medical decision making (see chart for details).  Labs. Cxr. Ecg.  Reviewed  nursing notes and prior charts for additional history.   After atypical symptoms for 2 days, at rest, labs reviewed,  trop is normal/negative. Symptoms/eval do not appear c/w acs.  Pt on recheck - no pain, is asking for food/drink - given meal and fluids.  Patient continues to deny pain or sob.  Patient current appears stable for d/c.     Final Clinical Impressions(s) / ED Diagnoses   Final diagnoses:  None    ED Discharge Orders    None       Cathren Laine, MD 04/28/18 1500

## 2018-04-28 NOTE — ED Notes (Signed)
Pt staets he understands instructions home stable after calling famiy for ride. Pt performs teachbacvk.

## 2018-04-28 NOTE — Discharge Instructions (Addendum)
It was our pleasure to provide your ER care today - we hope that you feel better.  From your lab tests, your kidney function is mildly increased from prior (1.44) - avoid taking any nsaid type medication such as ibuprofen/motrin, or aleve/naprosyn. Follow up with your doctor in the next 1-2 weeks.   For chest discomfort, follow up with cardiologist in the next 1-2 weeks - contact office to arrange appointment.   Return to ER if worse, new symptoms, trouble breathing, recurrent or persistent chest pain, other concern.

## 2018-04-28 NOTE — ED Triage Notes (Signed)
Per [facility]: Patient to ED from [place] clinic for further evaluation of CP. Patient reports central, non-radiating CP x 2 days, reproducible chest wall, pt denies SOB/dizziness/N/V. Patient states pain is worse in the morning after waking up. Given 324 ASA and 0.4mg  SL NTG with some relief, reports pain now 1/10. Resp e/u, skin warm/dry. EMS VS: 148/94, HR 67 sinus rhythm with some depression and LVH, 97% RA, RR 20. Hx HTN and hyperlipidemia, family hx cardiac disease.

## 2018-05-03 ENCOUNTER — Other Ambulatory Visit: Payer: Self-pay | Admitting: Cardiology

## 2018-05-03 NOTE — Telephone Encounter (Signed)
REFILL 

## 2018-06-09 DIAGNOSIS — E119 Type 2 diabetes mellitus without complications: Secondary | ICD-10-CM | POA: Diagnosis not present

## 2018-06-12 ENCOUNTER — Other Ambulatory Visit: Payer: Self-pay | Admitting: Cardiology

## 2018-06-15 DIAGNOSIS — E1122 Type 2 diabetes mellitus with diabetic chronic kidney disease: Secondary | ICD-10-CM | POA: Diagnosis not present

## 2018-06-15 DIAGNOSIS — Z9189 Other specified personal risk factors, not elsewhere classified: Secondary | ICD-10-CM | POA: Diagnosis not present

## 2018-06-15 DIAGNOSIS — N183 Chronic kidney disease, stage 3 (moderate): Secondary | ICD-10-CM | POA: Diagnosis not present

## 2018-06-15 DIAGNOSIS — E119 Type 2 diabetes mellitus without complications: Secondary | ICD-10-CM | POA: Diagnosis not present

## 2018-06-15 DIAGNOSIS — D649 Anemia, unspecified: Secondary | ICD-10-CM | POA: Diagnosis not present

## 2018-06-16 DIAGNOSIS — I10 Essential (primary) hypertension: Secondary | ICD-10-CM | POA: Diagnosis not present

## 2018-06-30 ENCOUNTER — Encounter: Payer: Self-pay | Admitting: Cardiology

## 2018-06-30 ENCOUNTER — Ambulatory Visit (INDEPENDENT_AMBULATORY_CARE_PROVIDER_SITE_OTHER): Payer: BLUE CROSS/BLUE SHIELD | Admitting: Cardiology

## 2018-06-30 VITALS — BP 137/85 | HR 64 | Ht 74.5 in | Wt 263.0 lb

## 2018-06-30 DIAGNOSIS — E785 Hyperlipidemia, unspecified: Secondary | ICD-10-CM

## 2018-06-30 DIAGNOSIS — I119 Hypertensive heart disease without heart failure: Secondary | ICD-10-CM | POA: Diagnosis not present

## 2018-06-30 DIAGNOSIS — R9431 Abnormal electrocardiogram [ECG] [EKG]: Secondary | ICD-10-CM

## 2018-06-30 DIAGNOSIS — I10 Essential (primary) hypertension: Secondary | ICD-10-CM

## 2018-06-30 NOTE — Progress Notes (Signed)
PCP: Daylene Katayama, PA  Clinic Note: Chief Complaint  Patient presents with  . Follow-up    No complaints  . Hypertension    HPI: Kristopher GUETTLER Sr. is a 57 y.o. male who is being seen today for management of resistant HTN -and ER follow-up.  Kristopher LAUVER Sr. was last seen on December 2018 --he was doing very well only complaining about arthritis pain in the left ankle.  He also was concerned about diabetes.  Recent Hospitalizations:   ER visit for precordial chest pain on May 1 - R upper chest pain, r/o MI. NO help with NTG.--Thought to be musculoskeletal.  Studies Personally Reviewed - (if available, images/films reviewed: From Epic Chart or Care Everywhere)  None   Interval History: Kristopher [name] presents here today for follow-up feeling good but the only complaint being painful swelling of his right hand.  He is happy about his blood pressure and was asking how much longer he is got to be on all these medicines.  He really denies any resting or exertional dyspnea.  No PND, orthopnea or edema.  The only time he notes dyspnea as if he really overexerts. No real edema, but does have hand swelling is noted.  No chest tightness or pressure with rest or exertion. No rapid irregular heartbeats palpitations.  No syncope/near syncope or TIA/amaurosis fugax.  ROS: A comprehensive was performed.Pertinent symptoms noted above  Review of Systems  Constitutional: Negative for malaise/fatigue.  HENT: Negative.  Negative for congestion and nosebleeds.   Eyes: Negative for blurred vision.  Respiratory: Negative for cough, shortness of breath and wheezing.   Cardiovascular:       Per history of present illness  Gastrointestinal: Negative for blood in stool and melena.  Genitourinary: Negative for hematuria.  Musculoskeletal: Positive for joint pain (Both knees & R ankle along with R hand - 2nd & 3rd finger (MIP& 1st PIP -- swolle n & tender)).  Skin: Negative.   Neurological: Negative  for dizziness, focal weakness and weakness.  Endo/Heme/Allergies: Negative for environmental allergies.  Psychiatric/Behavioral: Negative.        He has notably poor recollection of things discussed.  All other systems reviewed and are negative.   I have reviewed and (if needed) personally updated the patient's problem list, medications, allergies, past medical and surgical history, social and family history.   Past Medical History:  Diagnosis Date  . Arthritis   . Hyperlipidemia   . Hypertension     Past Surgical History:  Procedure Laterality Date  . HAND SURGERY    . KNEE ARTHROSCOPY Left   . RENAL ARTERY DOPPLERS  05/29/2017    Normal Renal A Bilaterally.  L > R kidney (3.2 cm) . Renal Cysts noted  - larges ~3.7 cm x 3.5 cm (R kidney), L kidney - ~1.7 cm x 1.6 cm -> recommend referral to Nephrology  . TRANSTHORACIC ECHOCARDIOGRAM  05/2017   Normal Echo. Mod Conc LVH. EF 55-60%.  No RWMA. Normal Valves. Gr 1 DD.    Current Meds  Medication Sig  . acetaminophen (TYLENOL) 500 MG tablet Take 500 mg by mouth as needed for mild pain.  Marland Kitchen amLODipine (NORVASC) 10 MG tablet Take 10 mg by mouth daily.  Marland Kitchen atorvastatin (LIPITOR) 10 MG tablet Take 10 mg by mouth daily.  Marland Kitchen BYSTOLIC 20 MG TABS TAKE 1 TABLET BY MOUTH EVERY DAY  . chlorthalidone (HYGROTON) 25 MG tablet TAKE 1 TABLET BY MOUTH EVERY DAY  . fluticasone (FLONASE) 50  MCG/ACT nasal spray Place 2 sprays into both nostrils as needed for allergies or rhinitis.  Marland Kitchen. loratadine (CLARITIN) 10 MG tablet Take 10 mg by mouth daily as needed for allergies.  Marland Kitchen. losartan (COZAAR) 25 MG tablet Take 25 mg by mouth daily.  . naproxen sodium (ALEVE) 220 MG tablet Take 220 mg by mouth as needed (knee swelling).  . potassium chloride (MICRO-K) 10 MEQ CR capsule Take 10 mEq by mouth daily.   Marland Kitchen. spironolactone (ALDACTONE) 50 MG tablet TAKE 1 TABLET BY MOUTH EVERY DAY  . [DISCONTINUED] valsartan (DIOVAN) 160 MG tablet Take 160 mg by mouth daily.   --He  actually is not taking valsartan.  I reviewed his medications.  No Known Allergies  Social History   Tobacco Use  . Smoking status: Never Smoker  . Smokeless tobacco: Never Used  Substance Use Topics  . Alcohol use: Yes    Comment: occasional  . Drug use: No   Social History   Social History Narrative   His sister: Valeria BatmanBeverly Thaniel (lives in [place]. Is quite involved in his care - may call to discuss.  Mr. Toni AmendCourtney has given permission to talk with her)    family history includes Cancer in his brother, father, mother, sister, and sister; Hypertension in his brother, sister, and sister.  Wt Readings from Last 3 Encounters:  06/30/18 263 lb (119.3 kg)  04/28/18 270 lb (122.5 kg)  03/02/18 264 lb (119.7 kg)    PHYSICAL EXAM BP 137/85   Pulse 64   Ht 6' 2.5" (1.892 m)   Wt 263 lb (119.3 kg)   BMI 33.32 kg/m   Physical Exam  Constitutional: He is oriented to person, place, and time. He appears well-developed and well-nourished. No distress.  Healthy-appearing.  Well-groomed  HENT:  Head: Normocephalic and atraumatic.  Neck: Normal range of motion. Neck supple. No hepatojugular reflux and no JVD present. Carotid bruit is not present.  Cardiovascular: Normal rate, regular rhythm, normal heart sounds and intact distal pulses.  No extrasystoles are present. PMI is not displaced. Exam reveals no gallop and no friction rub.  No murmur heard. Pulmonary/Chest: Effort normal and breath sounds normal. No respiratory distress. He has no wheezes. He has no rales.  Abdominal: Soft. Bowel sounds are normal. He exhibits no distension. There is no tenderness. There is no rebound.  No HSM  Musculoskeletal: Normal range of motion. He exhibits no edema.  Swollen right hand -  second and third digits  MTP and first PIP joints.-Mildly warm to touch.  Not erythematous.  Neurological: He is alert and oriented to person, place, and time.  Psychiatric: He has a normal mood and affect. His behavior is  normal. Judgment and thought content normal.  Vitals reviewed.    Adult ECG Report Not checked  Other studies Reviewed: Additional studies/ records that were reviewed today include:  Recent Labs: June 15, 2018  Na+ 139, K+ 4.6, Cl- 105, HCO3- 25 , BUN 31, Cr 1.49, Glu 138, Ca2+ 1.01; AST 14, ALT 11, AlkP 73,    CBC: W 7.5, H/H 12/37.0, Plt 384  TC 120, TG 121, HDL 34, LDL (direct) 68 (LCL calc not reported)   ASSESSMENT / PLAN: Problem List Items Addressed This Visit    Resistant hypertension - Primary (Chronic)    On multiple medications.  If there is concern for possible gout, we may need to consider titrating up losartan as opposed to using chlorthalidone.  However, for now we will can simply continue current  meds.      Relevant Medications   losartan (COZAAR) 25 MG tablet   Hypertensive heart disease without heart failure (Chronic)    Just at goal blood pressure on current dose of Bystolic,amlodipine, losartan and chlorthalidone along with spironolactone       Relevant Medications   losartan (COZAAR) 25 MG tablet   Hyperlipidemia with target LDL less than 100 (Chronic)    Labs still look good on current dose of atorvastatin.  No change.      Relevant Medications   losartan (COZAAR) 25 MG tablet   Abnormal EKG (Chronic)    LVH with repolarization changes on EKG consistent with concentric LVH on echo.         Current medicines are reviewed at length with the patient today. (+/- concerns) n/a The following changes have been made:  Patient Instructions  Medication Instructions:  Your physician recommends that you continue on your current medications as directed. Please refer to the Current Medication list given to you today.   Labwork: None   Testing/Procedures: None   Follow-Up: Your physician wants you to follow-up in 4 to 6 months with PA or NP. You will receive a reminder letter in the mail two months in advance. If you don't receive a letter, please  call our office to schedule the follow-up appointment.  Your physician wants you to follow-up in 1 year with Dr. Herbie Baltimore. You will receive a reminder letter in the mail two months in advance. If you don't receive a letter, please call our office to schedule the follow-up appointment.    Any Other Special Instructions Will Be Listed Below (If Applicable).     If you need a refill on your cardiac medications before your next appointment, please call your pharmacy.     Studies Ordered:   No orders of the defined types were placed in this encounter.     Bryan Lemma, M.D., M.S. Interventional Cardiologist   Pager # [phone] Phone # [phone] [address] [place], [place] [address]

## 2018-06-30 NOTE — Patient Instructions (Signed)
Medication Instructions:  Your physician recommends that you continue on your current medications as directed. Please refer to the Current Medication list given to you today.   Labwork: None   Testing/Procedures: None   Follow-Up: Your physician wants you to follow-up in 4 to 6 months with PA or NP. You will receive a reminder letter in the mail two months in advance. If you don't receive a letter, please call our office to schedule the follow-up appointment.  Your physician wants you to follow-up in 1 year with Dr. Herbie BaltimoreHarding. You will receive a reminder letter in the mail two months in advance. If you don't receive a letter, please call our office to schedule the follow-up appointment.    Any Other Special Instructions Will Be Listed Below (If Applicable).     If you need a refill on your cardiac medications before your next appointment, please call your pharmacy.

## 2018-07-01 ENCOUNTER — Encounter: Payer: Self-pay | Admitting: Cardiology

## 2018-07-01 NOTE — Assessment & Plan Note (Signed)
On multiple medications.  If there is concern for possible gout, we may need to consider titrating up losartan as opposed to using chlorthalidone.  However, for now we will can simply continue current meds.

## 2018-07-01 NOTE — Assessment & Plan Note (Signed)
Labs still look good on current dose of atorvastatin.  No change.

## 2018-07-01 NOTE — Assessment & Plan Note (Signed)
LVH with repolarization changes on EKG consistent with concentric LVH on echo.

## 2018-07-01 NOTE — Assessment & Plan Note (Signed)
Just at goal blood pressure on current dose of Bystolic,amlodipine, losartan and chlorthalidone along with spironolactone

## 2018-07-20 ENCOUNTER — Other Ambulatory Visit: Payer: Self-pay

## 2018-07-20 ENCOUNTER — Emergency Department (HOSPITAL_COMMUNITY)
Admission: EM | Admit: 2018-07-20 | Discharge: 2018-07-21 | Disposition: A | Discharge status: Home / Self Care | Payer: BLUE CROSS/BLUE SHIELD | Attending: Emergency Medicine | Admitting: Emergency Medicine

## 2018-07-20 ENCOUNTER — Encounter (HOSPITAL_COMMUNITY): Payer: Self-pay | Admitting: *Deleted

## 2018-07-20 ENCOUNTER — Emergency Department (HOSPITAL_COMMUNITY): Payer: BLUE CROSS/BLUE SHIELD

## 2018-07-20 DIAGNOSIS — I10 Essential (primary) hypertension: Secondary | ICD-10-CM | POA: Insufficient documentation

## 2018-07-20 DIAGNOSIS — Z79899 Other long term (current) drug therapy: Secondary | ICD-10-CM | POA: Diagnosis not present

## 2018-07-20 DIAGNOSIS — M25571 Pain in right ankle and joints of right foot: Secondary | ICD-10-CM | POA: Diagnosis not present

## 2018-07-20 NOTE — ED Notes (Signed)
Called for triage with no answer

## 2018-07-20 NOTE — ED Triage Notes (Signed)
Pt arrives with ankle pain and swelling since Thursday. Denies injury to the same. Pain worse with weight bearing activity.

## 2018-07-21 ENCOUNTER — Inpatient Hospital Stay (HOSPITAL_COMMUNITY): Admission: RE | Admit: 2018-07-21 | Payer: Self-pay | Source: Ambulatory Visit

## 2018-07-21 ENCOUNTER — Ambulatory Visit (HOSPITAL_BASED_OUTPATIENT_CLINIC_OR_DEPARTMENT_OTHER)
Admission: RE | Admit: 2018-07-21 | Discharge: 2018-07-21 | Disposition: A | Discharge status: Home / Self Care | Payer: BLUE CROSS/BLUE SHIELD | Source: Ambulatory Visit | Attending: Student | Admitting: Student

## 2018-07-21 DIAGNOSIS — M79609 Pain in unspecified limb: Secondary | ICD-10-CM | POA: Diagnosis not present

## 2018-07-21 MED ORDER — HYDROCODONE-ACETAMINOPHEN 5-325 MG PO TABS: 1.0000 | ORAL_TABLET | ORAL | 0 refills | Status: DC | PRN

## 2018-07-21 MED ORDER — METHYLPREDNISOLONE 4 MG PO TBPK: ORAL_TABLET | ORAL | 0 refills | Status: DC

## 2018-07-21 NOTE — Progress Notes (Signed)
Right lower extremity venous duplex has been completed. Negative for DVT. A cystic structure measuring 1.9 cm high by greater than 5.1 cm wide by greater than 5.1 cm long is found in the popliteal fossa.  07/21/18 1:29 PM Olen CordialGreg Sibley Rolison RVT

## 2018-07-21 NOTE — ED Provider Notes (Signed)
[facility]-EMERGENCY DEPT Provider Note   CSN: [id number] Arrival date & time: 07/20/18  2041     History   Chief Complaint Chief Complaint  Patient presents with  . Ankle Pain    HPI Kristopher McalpineVinson W Orr Sr. is a 57 y.o. male.  HPI 57 year old African-American male past medical history significant for arthritis and hypertension presents to the emergency department today for evaluation of right ankle pain and swelling that has been ongoing for approximately 5 days.  Patient states the pain is worse with weightbearing activity.  Does report standing at work.  Denies any injury to the area.  Patient reports associated swelling.  Patient has been taking Aleve for his symptoms with some relief.  Denies any calf swelling or pain.  Patient denies a history of DVT/PE.  Denies any chest pain or shortness of breath.  Patient has had similar symptoms in the past and was seen by orthopedic doctor last year for right ankle pain and swelling.  Was diagnosed with tendinosis.  Patient was started on Medrol Dosepak which improved his symptoms.  They did consider possible injection if symptoms persist.  They also considered gout however patient has no history of gout.  Patient denies erythema. Past Medical History:  Diagnosis Date  . Arthritis   . Hyperlipidemia   . Hypertension     Patient Active Problem List   Diagnosis Date Noted  . Pain and swelling of ankle, right 12/09/2017  . Hyperlipidemia with target LDL less than 100 06/18/2017  . Hypertensive heart disease without heart failure 06/18/2017  . Bilateral renal cysts 06/18/2017  . Resistant hypertension 05/18/2017  . H/O hypokalemia 05/18/2017  . Framingham cardiac risk 10-20% in next 10 years 03/17/2017  . Diverticulosis of large intestine without hemorrhage 09/09/2016  . Benign essential hypertension 07/15/2016  . Acute bilateral ankle pain 09/04/2015  . Closed nondisplaced fracture of navicular bone of left foot  09/04/2015  . Pain in both feet 09/04/2015  . Primary osteoarthritis of both knees 05/30/2015  . Arthralgia of both knees 01/05/2015  . Arthralgia of left hip 01/05/2015  . Abnormal EKG 06/18/2014  . Knee pain 03/28/2014    Past Surgical History:  Procedure Laterality Date  . HAND SURGERY    . KNEE ARTHROSCOPY Left   . RENAL ARTERY DOPPLERS  05/29/2017    Normal Renal A Bilaterally.  L > R kidney (3.2 cm) . Renal Cysts noted  - larges ~3.7 cm x 3.5 cm (R kidney), L kidney - ~1.7 cm x 1.6 cm -> recommend referral to Nephrology  . TRANSTHORACIC ECHOCARDIOGRAM  05/2017   Normal Echo. Mod Conc LVH. EF 55-60%.  No RWMA. Normal Valves. Gr 1 DD.        Home Medications    Prior to Admission medications   Medication Sig Start Date End Date Taking? Authorizing Provider  acetaminophen (TYLENOL) 500 MG tablet Take 500 mg by mouth as needed for mild pain.    [provider]  amLODipine (NORVASC) 10 MG tablet Take 10 mg by mouth daily. 09/01/17   [provider]  atorvastatin (LIPITOR) 10 MG tablet Take 10 mg by mouth daily. 03/30/17   [provider]  BYSTOLIC 20 MG TABS TAKE 1 TABLET BY MOUTH EVERY DAY 06/14/18   Marykay LexHarding, David W, MD  chlorthalidone (HYGROTON) 25 MG tablet TAKE 1 TABLET BY MOUTH EVERY DAY 05/03/18   Marykay LexHarding, David W, MD  fluticasone [facility](FLONASE) 50 MCG/ACT nasal spray Place 2 sprays into both  nostrils as needed for allergies or rhinitis.    [provider]  HYDROcodone-acetaminophen (NORCO/VICODIN) 5-325 MG tablet Take 1-2 tablets by mouth every 4 (four) hours as needed. 07/21/18   Rise Mu, PA-C  loratadine (CLARITIN) 10 MG tablet Take 10 mg by mouth daily as needed for allergies.    [provider]  losartan (COZAAR) 25 MG tablet Take 25 mg by mouth daily.    [provider]  methylPREDNISolone (MEDROL DOSEPAK) 4 MG TBPK tablet Take as directed 07/21/18   Demetrios Loll T, PA-C  naproxen sodium (ALEVE) 220 MG tablet  Take 220 mg by mouth as needed (knee swelling).    [provider]  potassium chloride (MICRO-K) 10 MEQ CR capsule Take 10 mEq by mouth daily.  09/19/17   [provider]  spironolactone (ALDACTONE) 50 MG tablet TAKE 1 TABLET BY MOUTH EVERY DAY 05/03/18   Marykay Lex, MD    Family History Family History  Problem Relation Age of Onset  . Cancer Mother   . Cancer Father   . Cancer Sister   . Hypertension Sister   . Cancer Brother   . Hypertension Brother   . Cancer Sister   . Hypertension Sister     Social History Social History   Tobacco Use  . Smoking status: Never Smoker  . Smokeless tobacco: Never Used  Substance Use Topics  . Alcohol use: Yes    Comment: occasional  . Drug use: No     Allergies   Patient has no known allergies.   Review of Systems Review of Systems  All other systems reviewed and are negative.    Physical Exam Updated Vital Signs BP 117/84 (BP Location: Left Arm)   Pulse 60   Temp 97.8 F (36.6 C) (Oral)   Resp 18   SpO2 99%   Physical Exam  Constitutional: He appears well-developed and well-nourished. No distress.  HENT:  Head: Normocephalic and atraumatic.  Eyes: Right eye exhibits no discharge. Left eye exhibits no discharge. No scleral icterus.  Neck: Normal range of motion.  Pulmonary/Chest: No respiratory distress.  Musculoskeletal: Normal range of motion.  Patient has swelling to the right ankle on the medial lateral malleolus.  Patient has no point tenderness.  He has fairly full passive range of motion with plantar and dorsiflexion.  Patient has no erythema or warmth over this joint.  There is midfoot tenderness.  DP pulses are 2+ bilaterally.  Brisk cap refill.  Sensation intact.  Posterior tibialis pulses are 2+ as well.  Patient has no calf tenderness or swelling.  Neurological: He is alert.  Skin: Skin is warm and dry. Capillary refill takes less than 2 seconds. No pallor.  Psychiatric: His behavior is  normal. Judgment and thought content normal.  Nursing note and vitals reviewed.    ED Treatments / Results  Labs (all labs ordered are listed, but only abnormal results are displayed) Labs Reviewed - No data to display  EKG None  Radiology Dg Ankle Complete Right  Result Date: 07/20/2018 CLINICAL DATA:  Acute right ankle pain and swelling without known injury. EXAM: RIGHT ANKLE - COMPLETE 3+ VIEW COMPARISON:  None. FINDINGS: There is no evidence of fracture, dislocation, or joint effusion. There is no evidence of arthropathy or other focal bone abnormality. Soft tissues are unremarkable. IMPRESSION: No acute abnormality seen in the right ankle. Electronically Signed   By: Lupita Raider, M.D.   On: 07/20/2018 21:43    Procedures Procedures (  including critical care time)  Medications Ordered in ED Medications - No data to display   Initial Impression / Assessment and Plan / ED Course  I have reviewed the triage vital signs and the nursing notes.  Pertinent labs & imaging results that were available during my care of the patient were reviewed by me and considered in my medical decision making (see chart for details).     Patient does have swelling of the right ankle.  He is neurovascularly intact.  No history of DVT or PE.  Patient denies chest pain or shortness of breath.  Joint has no erythema or warmth.  Full range of motion doubt pain.  X-ray showed no acute findings.  Patient able to ambulate.  Does have history of similar symptoms was seen by orthopedic doctor in the past with improvement in Medrol Dosepak.  Will re-prescribe Medrol Dosepak and short course of pain medication.  Encouraged symptomatic care at home.  Patient has no lower extremity edema or calf tenderness.  However will order outpatient ultrasound to have a DVT which I feel is very low likely.  Patient has no signs of septic arthritis.  Doubt gout at this time given he has no history is of erythema or warmth.  The  edema is not bilateral.  Doubt CHF exacerbation.  Patient encouraged to follow-up with his primary care and orthopedic doctor this week for ongoing symptoms and return to ED with any worsening symptoms.  Pt is hemodynamically stable, in NAD, & able to ambulate in the ED. Evaluation does not show pathology that would require ongoing emergent intervention or inpatient treatment. I explained the diagnosis to the patient. Pain has been managed & has no complaints prior to dc. Pt is comfortable with above plan and is stable for discharge at this time. All questions were answered prior to disposition. Strict return precautions for f/u to the ED were discussed. Encouraged follow up with PCP.   Final Clinical Impressions(s) / ED Diagnoses   Final diagnoses:  Acute right ankle pain    ED Discharge Orders        Ordered    LE VENOUS     07/21/18 0036    methylPREDNISolone (MEDROL DOSEPAK) 4 MG TBPK tablet     07/21/18 0036    HYDROcodone-acetaminophen (NORCO/VICODIN) 5-325 MG tablet  Every 4 hours PRN     07/21/18 0036       Rise Mu, PA-C 07/21/18 0043    Geoffery Lyons, MD 07/21/18 0600

## 2018-07-21 NOTE — Discharge Instructions (Addendum)
Your x-ray was reassuring.  I would take the steroid pack as prescribed.  Also have given you stronger pain medication to take as needed but would recommend taking your anti-inflammatories as well.  Perform ankle stretches. Apply a compressive ACE bandage. Rest and elevate the affected painful area.  Apply cold compresses intermittently as needed.  As pain recedes, begin normal activities slowly as tolerated.  Call if symptoms persist.  Make she follow-up with orthopedic doctor this week for recheck.  Return to ED if you develop any fevers, worsening pain, redness, pain to your right calf.  IMPORTANT PATIENT INSTRUCTIONS:  You have been scheduled for an Outpatient Vascular Study at [facility].    If tomorrow is a Saturday, Sunday or holiday, please go to the [facility] Emergency Department Registration Desk at 8 am tomorrow morning and tell them you are there for a vascular study.  If tomorrow is a weekday (Monday-Friday), please [facility] to [facility] at 8 am and tell them  you are  there for a vascular study.

## 2018-07-28 ENCOUNTER — Telehealth (INDEPENDENT_AMBULATORY_CARE_PROVIDER_SITE_OTHER): Payer: Self-pay

## 2018-07-28 NOTE — Telephone Encounter (Signed)
Patient called stating he had missed call from our office. I advised not sure what it was in regards to since there was no documentation. Patient not seen since 02/2018

## 2018-08-02 ENCOUNTER — Other Ambulatory Visit: Payer: Self-pay | Admitting: Cardiology

## 2018-08-04 DIAGNOSIS — I10 Essential (primary) hypertension: Secondary | ICD-10-CM | POA: Diagnosis not present

## 2018-08-04 DIAGNOSIS — E876 Hypokalemia: Secondary | ICD-10-CM | POA: Diagnosis not present

## 2018-08-04 DIAGNOSIS — N281 Cyst of kidney, acquired: Secondary | ICD-10-CM | POA: Diagnosis not present

## 2018-08-04 DIAGNOSIS — N182 Chronic kidney disease, stage 2 (mild): Secondary | ICD-10-CM | POA: Diagnosis not present

## 2018-09-13 DIAGNOSIS — H40003 Preglaucoma, unspecified, bilateral: Secondary | ICD-10-CM | POA: Diagnosis not present

## 2018-09-13 DIAGNOSIS — H524 Presbyopia: Secondary | ICD-10-CM | POA: Diagnosis not present

## 2018-09-16 ENCOUNTER — Other Ambulatory Visit: Payer: Self-pay | Admitting: Physician Assistant

## 2018-09-22 DIAGNOSIS — M170 Bilateral primary osteoarthritis of knee: Secondary | ICD-10-CM | POA: Diagnosis not present

## 2018-09-22 DIAGNOSIS — M25562 Pain in left knee: Secondary | ICD-10-CM | POA: Diagnosis not present

## 2018-09-22 DIAGNOSIS — M25561 Pain in right knee: Secondary | ICD-10-CM | POA: Diagnosis not present

## 2018-09-22 DIAGNOSIS — N189 Chronic kidney disease, unspecified: Secondary | ICD-10-CM | POA: Diagnosis not present

## 2018-09-22 DIAGNOSIS — I129 Hypertensive chronic kidney disease with stage 1 through stage 4 chronic kidney disease, or unspecified chronic kidney disease: Secondary | ICD-10-CM | POA: Diagnosis not present

## 2018-09-29 DIAGNOSIS — D480 Neoplasm of uncertain behavior of bone and articular cartilage: Secondary | ICD-10-CM | POA: Diagnosis not present

## 2018-09-29 DIAGNOSIS — M25461 Effusion, right knee: Secondary | ICD-10-CM | POA: Diagnosis not present

## 2018-09-29 DIAGNOSIS — M25462 Effusion, left knee: Secondary | ICD-10-CM | POA: Diagnosis not present

## 2018-09-29 DIAGNOSIS — M170 Bilateral primary osteoarthritis of knee: Secondary | ICD-10-CM | POA: Diagnosis not present

## 2018-10-09 ENCOUNTER — Emergency Department (HOSPITAL_COMMUNITY): Payer: BLUE CROSS/BLUE SHIELD

## 2018-10-09 ENCOUNTER — Other Ambulatory Visit: Payer: Self-pay

## 2018-10-09 ENCOUNTER — Emergency Department (HOSPITAL_COMMUNITY)
Admission: EM | Admit: 2018-10-09 | Discharge: 2018-10-09 | Disposition: A | Discharge status: Home / Self Care | Payer: BLUE CROSS/BLUE SHIELD | Attending: Emergency Medicine | Admitting: Emergency Medicine

## 2018-10-09 DIAGNOSIS — I10 Essential (primary) hypertension: Secondary | ICD-10-CM | POA: Diagnosis not present

## 2018-10-09 DIAGNOSIS — R079 Chest pain, unspecified: Secondary | ICD-10-CM | POA: Diagnosis not present

## 2018-10-09 DIAGNOSIS — M25521 Pain in right elbow: Secondary | ICD-10-CM | POA: Diagnosis not present

## 2018-10-09 DIAGNOSIS — M7989 Other specified soft tissue disorders: Secondary | ICD-10-CM | POA: Diagnosis not present

## 2018-10-09 DIAGNOSIS — R0789 Other chest pain: Secondary | ICD-10-CM

## 2018-10-09 DIAGNOSIS — Z79899 Other long term (current) drug therapy: Secondary | ICD-10-CM | POA: Insufficient documentation

## 2018-10-09 LAB — COMPREHENSIVE METABOLIC PANEL
ALK PHOS: 72 U/L (ref 38–126)
ALT: 16 U/L (ref 0–44)
ANION GAP: 9 (ref 5–15)
AST: 18 U/L (ref 15–41)
Albumin: 3.8 g/dL (ref 3.5–5.0)
BILIRUBIN TOTAL: 0.5 mg/dL (ref 0.3–1.2)
BUN: 32 mg/dL — ABNORMAL HIGH (ref 6–20)
CALCIUM: 10 mg/dL (ref 8.9–10.3)
CHLORIDE: 107 mmol/L (ref 98–111)
CO2: 25 mmol/L (ref 22–32)
Creatinine, Ser: 1.52 mg/dL — ABNORMAL HIGH (ref 0.61–1.24)
GFR, EST AFRICAN AMERICAN: 57 mL/min — AB (ref >60)
GFR, EST NON AFRICAN AMERICAN: 49 mL/min — AB (ref >60)
Glucose, Bld: 101 mg/dL — ABNORMAL HIGH (ref 70–99)
Potassium: 4.7 mmol/L (ref 3.5–5.1)
SODIUM: 141 mmol/L (ref 135–145)
Total Protein: 7.7 g/dL (ref 6.5–8.1)

## 2018-10-09 LAB — CBC WITH DIFFERENTIAL/PLATELET
ABS IMMATURE GRANULOCYTES: 0.05 10*3/uL (ref 0.00–0.07)
Basophils Absolute: 0.1 10*3/uL (ref 0.0–0.1)
Basophils Relative: 1 %
Eosinophils Absolute: 0.3 10*3/uL (ref 0.0–0.5)
Eosinophils Relative: 3 %
HEMATOCRIT: 36 % — AB (ref 39.0–52.0)
HEMOGLOBIN: 11.4 g/dL — AB (ref 13.0–17.0)
Immature Granulocytes: 1 %
LYMPHS PCT: 19 %
Lymphs Abs: 2 10*3/uL (ref 0.7–4.0)
MCH: 28.3 pg (ref 26.0–34.0)
MCHC: 31.7 g/dL (ref 30.0–36.0)
MCV: 89.3 fL (ref 80.0–100.0)
MONOS PCT: 8 %
Monocytes Absolute: 0.9 10*3/uL (ref 0.1–1.0)
NEUTROS ABS: 7.3 10*3/uL (ref 1.7–7.7)
Neutrophils Relative %: 68 %
Platelets: 362 10*3/uL (ref 150–400)
RBC: 4.03 MIL/uL — AB (ref 4.22–5.81)
RDW: 12.8 % (ref 11.5–15.5)
WBC: 10.6 10*3/uL — AB (ref 4.0–10.5)
nRBC: 0 % (ref 0.0–0.2)

## 2018-10-09 LAB — I-STAT TROPONIN, ED: TROPONIN I, POC: 0.01 ng/mL (ref 0.00–0.08)

## 2018-10-09 MED ORDER — KETOROLAC TROMETHAMINE 30 MG/ML IJ SOLN: 30.0000 mg | Freq: Once | INTRAMUSCULAR | Status: DC

## 2018-10-09 MED ORDER — KETOROLAC TROMETHAMINE 15 MG/ML IJ SOLN
15.0000 mg | Freq: Once | INTRAMUSCULAR | Status: AC
  Administered 2018-10-09: 15 mg via INTRAVENOUS
  Filled 2018-10-09: qty 1

## 2018-10-09 MED ORDER — PREDNISONE 10 MG (21) PO TBPK: ORAL_TABLET | Freq: Every day | ORAL | 0 refills | Status: DC

## 2018-10-09 NOTE — ED Triage Notes (Signed)
Patient reports pain right elbow and mid chest area that started 4 days ago. Chest pain is non radiating. Patient states recently going to the gym. Denies SOB/N/V. Patient with hx arthritis, HTN.

## 2018-10-09 NOTE — Discharge Instructions (Addendum)
Your evaluated today for chest pain and right elbow pain.  Your EKG, chest x-ray and labs were negative.  Your elbow pain is most likely an acute flare of your gout.  I will prescribe you prednisone for your gout flare.  Please take as prescribed.  Return to the ED with any new or worsening symptoms such as:  Contact a health care provider if: You have a fever. Your chest pain becomes worse. You have new symptoms. Get help right away if: You have nausea or vomiting. You feel sweaty or light-headed. You have a cough with phlegm (sputum) or you cough up blood. You develop shortness of breath.

## 2018-10-09 NOTE — ED Provider Notes (Signed)
[facility]-EMERGENCY DEPT Provider Note   CSN: [id number] Arrival date & time: 10/09/18  1416     History   Chief Complaint Chief Complaint  Patient presents with  . Chest Pain  . Elbow Pain   HPI Kristopher MCIVER Sr. is a 57 y.o. male with a past medical history significant for hypertension, arthritis and hyperlipidemia who presents for evaluation of right elbow pain and chest pain. Chest pain is located over the left upper chest. Pain began 3 days ago. Pain is non exertional in nature, nonpleuritic and non radiating.  Denies dyspnea on exertion. Pain is rated a 3/10. Denies diaphoresis, SOB, lightheadedness or dizziness, cough, fever, chills. States he recently started going to the gym and has been lifting "heavy weights to build by shoulders and back." Admits to a history of similar pain approximately 6 months ago. Patient sees Cardiology outpatient for resistant Hypertension.  Admits to right elbow pain. Pain is intermittent in nature. Pain occurs when he flexes his right elbow against resistance. Denies swelling, redness or warmth to the elbow. Admits to a history of gout in his left elbow. Pain is intermittent in nature. Pain is resolved when he rests the elbow. States he has been using is arms more frequently to left himself as he has been having increasing pain with his arthritis in his bilateral knees.  HPI  Past Medical History:  Diagnosis Date  . Arthritis   . Hyperlipidemia   . Hypertension     Patient Active Problem List   Diagnosis Date Noted  . Pain and swelling of ankle, right 12/09/2017  . Hyperlipidemia with target LDL less than 100 06/18/2017  . Hypertensive heart disease without heart failure 06/18/2017  . Bilateral renal cysts 06/18/2017  . Resistant hypertension 05/18/2017  . H/O hypokalemia 05/18/2017  . Framingham cardiac risk 10-20% in next 10 years 03/17/2017  . Diverticulosis of large intestine without hemorrhage 09/09/2016  .  Benign essential hypertension 07/15/2016  . Acute bilateral ankle pain 09/04/2015  . Closed nondisplaced fracture of navicular bone of left foot 09/04/2015  . Pain in both feet 09/04/2015  . Primary osteoarthritis of both knees 05/30/2015  . Arthralgia of both knees 01/05/2015  . Arthralgia of left hip 01/05/2015  . Abnormal EKG 06/18/2014  . Knee pain 03/28/2014    Past Surgical History:  Procedure Laterality Date  . HAND SURGERY    . KNEE ARTHROSCOPY Left   . RENAL ARTERY DOPPLERS  05/29/2017    Normal Renal A Bilaterally.  L > R kidney (3.2 cm) . Renal Cysts noted  - larges ~3.7 cm x 3.5 cm (R kidney), L kidney - ~1.7 cm x 1.6 cm -> recommend referral to Nephrology  . TRANSTHORACIC ECHOCARDIOGRAM  05/2017   Normal Echo. Mod Conc LVH. EF 55-60%.  No RWMA. Normal Valves. Gr 1 DD.        Home Medications    Prior to Admission medications   Medication Sig Start Date End Date Taking? Authorizing Provider  amLODipine (NORVASC) 10 MG tablet Take 10 mg by mouth daily. 09/01/17  Yes [provider]  atorvastatin (LIPITOR) 10 MG tablet Take 10 mg by mouth daily. 03/30/17  Yes [provider]  BYSTOLIC 20 MG TABS TAKE 1 TABLET BY MOUTH EVERY DAY 06/14/18  Yes Marykay Lex, MD  chlorthalidone (HYGROTON) 25 MG tablet TAKE 1 TABLET BY MOUTH EVERY DAY 08/02/18  Yes Marykay Lex, MD  losartan (COZAAR) 25 MG tablet TAKE 1 TABLET  BY MOUTH EVERY DAY 09/16/18  Yes Marykay Lex, MD  naproxen sodium (ALEVE) 220 MG tablet Take 220 mg by mouth as needed (knee swelling).   Yes [provider]  potassium chloride (MICRO-K) 10 MEQ CR capsule Take 10 mEq by mouth daily.  09/19/17  Yes [provider]  PREVIDENT 5000 SENSITIVE 1.1-5 % PSTE Take 1 application by mouth daily. Brush thoroughly once daily for 2 min at bedtime in place of toothpaste, don't rinse 09/08/18  Yes [provider]  spironolactone (ALDACTONE) 50 MG tablet TAKE 1 TABLET BY MOUTH  EVERY DAY 08/02/18  Yes Marykay Lex, MD  predniSONE (STERAPRED UNI-PAK 21 TAB) 10 MG (21) TBPK tablet Take by mouth daily. Take 6 tabs by mouth daily  for 2 days, then 5 tabs for 2 days, then 4 tabs for 2 days, then 3 tabs for 2 days, 2 tabs for 2 days, then 1 tab by mouth daily for 2 days 10/09/18   Henderly, Britni A, PA-C    Family History Family History  Problem Relation Age of Onset  . Cancer Mother   . Cancer Father   . Cancer Sister   . Hypertension Sister   . Cancer Brother   . Hypertension Brother   . Cancer Sister   . Hypertension Sister     Social History Social History   Tobacco Use  . Smoking status: Never Smoker  . Smokeless tobacco: Never Used  Substance Use Topics  . Alcohol use: Yes    Comment: occasional  . Drug use: No     Allergies   Patient has no known allergies.   Review of Systems Review of Systems  Constitutional: Negative for activity change, appetite change, chills, diaphoresis, fatigue, fever and unexpected weight change.  Respiratory: Negative.   Cardiovascular: Positive for chest pain. Negative for palpitations and leg swelling.  Gastrointestinal: Negative.   Genitourinary: Negative.   Musculoskeletal: Negative.        Right elbow pain  Skin: Negative.   Neurological: Negative for dizziness, facial asymmetry, weakness, light-headedness and numbness.     Physical Exam Updated Vital Signs BP (!) 152/80 (BP Location: Left Arm)   Pulse 64 Comment: Simultaneous filing. User may not have seen previous data.  Temp 98.1 F (36.7 C) (Oral)   Resp 16   Ht 6\' 2"  (1.88 m)   Wt 114.3 kg   SpO2 100% Comment: Simultaneous filing. User may not have seen previous data.  BMI 32.35 kg/m   Physical Exam  Constitutional: He appears well-developed and well-nourished.  Non-toxic appearance. He does not appear ill. No distress.  HENT:  Head: Atraumatic.  Eyes: Pupils are equal, round, and reactive to light.  Neck: Normal range of motion. Neck  supple.  Cardiovascular: Normal rate, regular rhythm, intact distal pulses and normal pulses. Exam reveals no gallop, no distant heart sounds and no friction rub.  No murmur heard.  No systolic murmur is present. Pulmonary/Chest: Effort normal and breath sounds normal. No accessory muscle usage. No respiratory distress. He has no decreased breath sounds. He has no wheezes. He has no rhonchi. He has no rales. He exhibits tenderness. He exhibits no mass, no laceration, no crepitus, no edema, no deformity, no swelling and no retraction.  Left sided chest wall tenderness to palpation.  Abdominal: Soft. Bowel sounds are normal. He exhibits no distension, no ascites and no mass. There is no splenomegaly or hepatomegaly. There is no tenderness. There is no rebound and no guarding.  Musculoskeletal: Normal range of motion.       Right lower leg: Normal.       Left lower leg: Normal.  Neurological: He is alert.  Skin: Skin is warm and dry. He is not diaphoretic. No cyanosis. No pallor.  Psychiatric: He has a normal mood and affect.  Nursing note and vitals reviewed.    ED Treatments / Results  Labs (all labs ordered are listed, but only abnormal results are displayed) Labs Reviewed  CBC WITH DIFFERENTIAL/PLATELET - Abnormal; Notable for the following components:      Result Value   WBC 10.6 (*)    RBC 4.03 (*)    Hemoglobin 11.4 (*)    HCT 36.0 (*)    All other components within normal limits  COMPREHENSIVE METABOLIC PANEL - Abnormal; Notable for the following components:   Glucose, Bld 101 (*)    BUN 32 (*)    Creatinine, Ser 1.52 (*)    GFR calc non Af Amer 49 (*)    GFR calc Af Amer 57 (*)    All other components within normal limits  I-STAT TROPONIN, ED    EKG EKG Interpretation  Date/Time:  Saturday October 09 2018 14:31:03 EDT Ventricular Rate:  70 PR Interval:    QRS Duration: 103 QT Interval:  390 QTC Calculation: 421 R Axis:   28 Text Interpretation:  Sinus rhythm Left  ventricular hypertrophy Confirmed by Raeford Razor [phone]) on 10/09/2018 3:32:10 PM   Radiology Dg Chest 2 View  Result Date: 10/09/2018 CLINICAL DATA:  Patient reports pain mid chest area that started 4 days ago. Chest pain is non radiating. Denies SOB/N/V. Patient with hx HTN. EXAM: CHEST - 2 VIEW COMPARISON:  Chest x-ray dated 04/28/2018. FINDINGS: Heart size is upper normal, stable. Overall cardiomediastinal silhouette is stable. Lungs are clear. No pleural effusion or pneumothorax seen. Osseous structures about the chest are unremarkable. IMPRESSION: No active cardiopulmonary disease. No evidence of pneumonia or pulmonary edema. Electronically Signed   By: Bary Richard M.D.   On: 10/09/2018 15:20   Dg Elbow Complete Right  Result Date: 10/09/2018 CLINICAL DATA:  Posterior right elbow pain and swelling. EXAM: RIGHT ELBOW - COMPLETE 3+ VIEW COMPARISON:  None. FINDINGS: Negative for fracture or dislocation in the right elbow. No large joint effusion. There is a peripheral IV in the antecubital region. Enthesopathic changes along the posterior olecranon. Degenerative changes with the small loose body at the coronoid process of the ulna. Soft tissue prominence or swelling along the posterior aspect of the elbow. IMPRESSION: No acute bone abnormality to the right elbow. Degenerative and enthesopathic changes. Electronically Signed   By: Richarda Overlie M.D.   On: 10/09/2018 16:55    Procedures Procedures (including critical care time)  Medications Ordered in ED Medications  ketorolac (TORADOL) 15 MG/ML injection 15 mg (15 mg Intravenous Given 10/09/18 1633)     Initial Impression / Assessment and Plan / ED Course  I have reviewed the triage vital signs and the nursing notes.  Pertinent labs & imaging results that were available during my care of the patient were reviewed by me and considered in my medical decision making (see chart for details).  58 year old otherwise well appearing male  presents for atypical chest pain and elbow pain x 3 days. Chest pain non-exertional. No SOB, diaphoresis. Chest wall tender to palpation. Heart score 3-low risk, Wells low risk, EKG sinus rhythm with LVH, similar to previous EKG from July, Troponin negative, Labs creatinine  1.5, similar to previous labs, Chest x ray without infiltrates, CHF, pleural effusion or pneumothorax.  Right elbow plain film without evidence of fracture, dislocation or effusion.  Had elevated blood pressures on the department, however per nursing patient has been bending his arm and has been moving when these been taken.  Blood pressure 152/80 with manual blood pressure cuff.  Chest pain resolved with Toradol.  Likely musculoskeletal chest wall pain.  Right elbow pain reduced.  His elbow pain is most likely a recurrence of his gout which she has had previously in both of his elbows.  Cannot take NSAIDs, will prescribe short course of prednisone for this.  Low suspicion for septic joint as no erythema, edema or warmth.  Patient is to be discharged with recommendation to follow up with PCP/Cardiologist in regards to today's hospital visit. Chest pain is not likely of cardiac or pulmonary etiology d/t presentation, VSS, no tracheal deviation, no JVD or new murmur, RRR, breath sounds equal bilaterally. Pt has been advised to return to the ED if CP becomes exertional, associated with diaphoresis or nausea, radiates to left jaw/arm, worsens or becomes concerning in any way. Pt appears reliable for follow up and is agreeable to discharge.      Final Clinical Impressions(s) / ED Diagnoses   Final diagnoses:  Right elbow pain  Chest wall pain    ED Discharge Orders         Ordered    predniSONE (STERAPRED UNI-PAK 21 TAB) 10 MG (21) TBPK tablet  Daily     10/09/18 1724           Henderly, Britni A, PA-C 10/09/18 1734    Raeford Razor, MD 10/09/18 1909

## 2018-10-28 DIAGNOSIS — Z23 Encounter for immunization: Secondary | ICD-10-CM | POA: Diagnosis not present

## 2018-11-02 DIAGNOSIS — M25462 Effusion, left knee: Secondary | ICD-10-CM | POA: Diagnosis not present

## 2018-11-02 DIAGNOSIS — M25461 Effusion, right knee: Secondary | ICD-10-CM | POA: Diagnosis not present

## 2018-11-02 DIAGNOSIS — M170 Bilateral primary osteoarthritis of knee: Secondary | ICD-10-CM | POA: Diagnosis not present

## 2018-11-02 DIAGNOSIS — M25569 Pain in unspecified knee: Secondary | ICD-10-CM | POA: Diagnosis not present

## 2018-11-19 DIAGNOSIS — R600 Localized edema: Secondary | ICD-10-CM | POA: Diagnosis not present

## 2018-11-22 DIAGNOSIS — M79672 Pain in left foot: Secondary | ICD-10-CM | POA: Diagnosis not present

## 2018-11-22 DIAGNOSIS — M79671 Pain in right foot: Secondary | ICD-10-CM | POA: Diagnosis not present

## 2018-11-22 DIAGNOSIS — M7732 Calcaneal spur, left foot: Secondary | ICD-10-CM | POA: Diagnosis not present

## 2018-11-22 DIAGNOSIS — M19072 Primary osteoarthritis, left ankle and foot: Secondary | ICD-10-CM | POA: Diagnosis not present

## 2018-11-22 DIAGNOSIS — M19071 Primary osteoarthritis, right ankle and foot: Secondary | ICD-10-CM | POA: Diagnosis not present

## 2018-11-22 DIAGNOSIS — M7989 Other specified soft tissue disorders: Secondary | ICD-10-CM | POA: Diagnosis not present

## 2018-12-14 DIAGNOSIS — I10 Essential (primary) hypertension: Secondary | ICD-10-CM | POA: Diagnosis not present

## 2018-12-14 DIAGNOSIS — Z9189 Other specified personal risk factors, not elsewhere classified: Secondary | ICD-10-CM | POA: Diagnosis not present

## 2018-12-14 DIAGNOSIS — E119 Type 2 diabetes mellitus without complications: Secondary | ICD-10-CM | POA: Diagnosis not present

## 2018-12-14 DIAGNOSIS — Z1159 Encounter for screening for other viral diseases: Secondary | ICD-10-CM | POA: Diagnosis not present

## 2018-12-14 DIAGNOSIS — D649 Anemia, unspecified: Secondary | ICD-10-CM | POA: Diagnosis not present

## 2018-12-15 DIAGNOSIS — E876 Hypokalemia: Secondary | ICD-10-CM | POA: Diagnosis not present

## 2018-12-15 DIAGNOSIS — N182 Chronic kidney disease, stage 2 (mild): Secondary | ICD-10-CM | POA: Diagnosis not present

## 2018-12-15 DIAGNOSIS — N281 Cyst of kidney, acquired: Secondary | ICD-10-CM | POA: Diagnosis not present

## 2018-12-15 DIAGNOSIS — I10 Essential (primary) hypertension: Secondary | ICD-10-CM | POA: Diagnosis not present

## 2018-12-16 DIAGNOSIS — N401 Enlarged prostate with lower urinary tract symptoms: Secondary | ICD-10-CM | POA: Diagnosis not present

## 2018-12-16 DIAGNOSIS — D649 Anemia, unspecified: Secondary | ICD-10-CM | POA: Diagnosis not present

## 2018-12-16 DIAGNOSIS — E119 Type 2 diabetes mellitus without complications: Secondary | ICD-10-CM | POA: Diagnosis not present

## 2018-12-16 DIAGNOSIS — E785 Hyperlipidemia, unspecified: Secondary | ICD-10-CM | POA: Diagnosis not present

## 2018-12-24 ENCOUNTER — Emergency Department (HOSPITAL_COMMUNITY): Payer: BLUE CROSS/BLUE SHIELD

## 2018-12-24 ENCOUNTER — Emergency Department (HOSPITAL_COMMUNITY)
Admission: EM | Admit: 2018-12-24 | Discharge: 2018-12-24 | Disposition: A | Discharge status: Home / Self Care | Payer: BLUE CROSS/BLUE SHIELD | Attending: Emergency Medicine | Admitting: Emergency Medicine

## 2018-12-24 ENCOUNTER — Encounter (HOSPITAL_COMMUNITY): Payer: Self-pay | Admitting: Emergency Medicine

## 2018-12-24 DIAGNOSIS — Y9389 Activity, other specified: Secondary | ICD-10-CM | POA: Insufficient documentation

## 2018-12-24 DIAGNOSIS — M7989 Other specified soft tissue disorders: Secondary | ICD-10-CM | POA: Diagnosis not present

## 2018-12-24 DIAGNOSIS — Y999 Unspecified external cause status: Secondary | ICD-10-CM | POA: Insufficient documentation

## 2018-12-24 DIAGNOSIS — R600 Localized edema: Secondary | ICD-10-CM | POA: Diagnosis not present

## 2018-12-24 DIAGNOSIS — X500XXA Overexertion from strenuous movement or load, initial encounter: Secondary | ICD-10-CM | POA: Insufficient documentation

## 2018-12-24 DIAGNOSIS — I10 Essential (primary) hypertension: Secondary | ICD-10-CM | POA: Diagnosis not present

## 2018-12-24 DIAGNOSIS — Y929 Unspecified place or not applicable: Secondary | ICD-10-CM | POA: Insufficient documentation

## 2018-12-24 DIAGNOSIS — M25531 Pain in right wrist: Secondary | ICD-10-CM | POA: Diagnosis not present

## 2018-12-24 NOTE — ED Provider Notes (Signed)
Emergency Department Provider Note   I have reviewed the triage vital signs and the nursing notes.   HISTORY  Chief Complaint Wrist Pain   HPI Kristopher McalpineVinson W Coddington Sr. is a 57 y.o. male with PMH of arthritis, HLD, and HTN presents to the emergency department for evaluation of atraumatic right wrist pain and swelling.  Patient has had several days of symptoms.  Pain is worse with movement.  He denies any redness over the wrist.  No fevers or chills.  Patient states that his knees have been bothering him and he has had to use his hand/wrist to lift himself up out of the chair and he thinks he may be putting more pressure on the wrist than normal.  Past Medical History:  Diagnosis Date  . Arthritis   . Hyperlipidemia   . Hypertension     Patient Active Problem List   Diagnosis Date Noted  . Pain and swelling of ankle, right 12/09/2017  . Hyperlipidemia with target LDL less than 100 06/18/2017  . Hypertensive heart disease without heart failure 06/18/2017  . Bilateral renal cysts 06/18/2017  . Resistant hypertension 05/18/2017  . H/O hypokalemia 05/18/2017  . Framingham cardiac risk 10-20% in next 10 years 03/17/2017  . Diverticulosis of large intestine without hemorrhage 09/09/2016  . Benign essential hypertension 07/15/2016  . Acute bilateral ankle pain 09/04/2015  . Closed nondisplaced fracture of navicular bone of left foot 09/04/2015  . Pain in both feet 09/04/2015  . Primary osteoarthritis of both knees 05/30/2015  . Arthralgia of both knees 01/05/2015  . Arthralgia of left hip 01/05/2015  . Abnormal EKG 06/18/2014  . Knee pain 03/28/2014    Past Surgical History:  Procedure Laterality Date  . HAND SURGERY    . KNEE ARTHROSCOPY Left   . RENAL ARTERY DOPPLERS  05/29/2017    Normal Renal A Bilaterally.  L > R kidney (3.2 cm) . Renal Cysts noted  - larges ~3.7 cm x 3.5 cm (R kidney), L kidney - ~1.7 cm x 1.6 cm -> recommend referral to Nephrology  . TRANSTHORACIC  ECHOCARDIOGRAM  05/2017   Normal Echo. Mod Conc LVH. EF 55-60%.  No RWMA. Normal Valves. Gr 1 DD.   Allergies Patient has no known allergies.  Family History  Problem Relation Age of Onset  . Cancer Mother   . Cancer Father   . Cancer Sister   . Hypertension Sister   . Cancer Brother   . Hypertension Brother   . Cancer Sister   . Hypertension Sister     Social History Social History   Tobacco Use  . Smoking status: Never Smoker  . Smokeless tobacco: Never Used  Substance Use Topics  . Alcohol use: Yes    Comment: occasional  . Drug use: No    Review of Systems  Constitutional: No fever/chills Musculoskeletal: Negative for back pain. Positive left wrist pain and swelling.  Skin: Negative for rash. Neurological: Negative for headaches, focal weakness or numbness.  10-point ROS otherwise negative.  ____________________________________________   PHYSICAL EXAM:  VITAL SIGNS: ED Triage Vitals  Enc Vitals Group     BP 12/24/18 1002 (!) 161/93     Pulse Rate 12/24/18 1002 74     Resp 12/24/18 1002 18     Temp 12/24/18 1002 97.8 F (36.6 C)     Temp Source 12/24/18 1002 Oral     SpO2 12/24/18 1002 98 %     Pain Score 12/24/18 1001 8   Constitutional:  Alert and oriented. Well appearing and in no acute distress. Eyes: Conjunctivae are normal.  Head: Atraumatic. Nose: No congestion/rhinnorhea. Mouth/Throat: Mucous membranes are moist. Neck: No stridor.   Cardiovascular: Normal rate, regular rhythm. Respiratory: Normal respiratory effort.  Gastrointestinal: No distention.  Musculoskeletal: Swelling over the dorsal aspect of the right wrist. No erythema or warmth. Full ROM with some hesitation at times 2/2 pain. No bony hand or elbow tenderness. No bruising.  Neurologic:  Normal speech and language. No gross focal neurologic deficits are appreciated.  Skin:  Skin is warm, dry and intact. No rash  noted.  ____________________________________________  RADIOLOGY  Dg Wrist Complete Right  Result Date: 12/24/2018 CLINICAL DATA:  Right wrist and hand pain and swelling. No known injury. EXAM: RIGHT WRIST - COMPLETE 3+ VIEW COMPARISON:  None. FINDINGS: There is no fracture or dislocation. Minimal degenerative changes between the scaphoid and trapezium and trapezoid and at the first [facility]. Soft tissue swelling around the wrist could represent a joint effusion. Slight degenerative changes of the distal radioulnar joint. Chronic changes in the distal first metacarpal are not felt to be significant. IMPRESSION: No acute bone abnormality. Slight arthritic changes in the radial aspect of the wrist as described. Possible joint effusion. Electronically Signed   By: Francene BoyersJames  Maxwell M.D.   On: 12/24/2018 10:54    ____________________________________________   PROCEDURES  Procedure(s) performed:   Procedures  None ____________________________________________   INITIAL IMPRESSION / ASSESSMENT AND PLAN / ED COURSE  Pertinent labs & imaging results that were available during my care of the patient were reviewed by me and considered in my medical decision making (see chart for details).  Patient presents to the emergency department for evaluation of right wrist pain and swelling.  No exam findings to suggest septic joint. No clear injury but patient has been using wrist more to lift himself. Plan for plain films and likely removable wrist splint for comfort. Advised frequent removal to facility joint mobility (Q1H splint removal).   Patient plain film without acute injury. Plan as above with strict ED return precautions. Provided f/u information for ortho.  ____________________________________________  FINAL CLINICAL IMPRESSION(S) / ED DIAGNOSES  Final diagnoses:  Right wrist pain    Note:  This document was prepared using Dragon voice recognition software and may include unintentional  dictation errors.  Alona BeneJoshua Tametra Ahart, MD Emergency Medicine    Canna Nickelson, Arlyss RepressJoshua G, MD 12/24/18 [phone]

## 2018-12-24 NOTE — ED Triage Notes (Signed)
Pt c/o right wrist and hand pain and swelling for couple days. Denies any injuries or falls.

## 2018-12-24 NOTE — Discharge Instructions (Signed)
You were seen in the ED today with wrist pain. Call your PCP to schedule a follow up appointment. Take Tylenol and/or Motrin as needed for pain and swelling. You can apply ice and keep the wrist elevated at home. Use the splint provided sparingly. Remove it at least one per hour to move the wrist around and prevent stiffness. Keep the wrist out of the splint as much as possible. Return to the ED with any joint redness, warmth, or fever at home.

## 2018-12-24 NOTE — ED Notes (Signed)
ED Provider at bedside. 

## 2018-12-28 DIAGNOSIS — M109 Gout, unspecified: Secondary | ICD-10-CM | POA: Diagnosis not present

## 2019-01-07 DIAGNOSIS — M170 Bilateral primary osteoarthritis of knee: Secondary | ICD-10-CM | POA: Diagnosis not present

## 2019-01-10 DIAGNOSIS — M170 Bilateral primary osteoarthritis of knee: Secondary | ICD-10-CM | POA: Diagnosis not present

## 2019-01-18 DIAGNOSIS — M109 Gout, unspecified: Secondary | ICD-10-CM | POA: Diagnosis not present

## 2019-01-18 DIAGNOSIS — N189 Chronic kidney disease, unspecified: Secondary | ICD-10-CM | POA: Diagnosis not present

## 2019-01-18 DIAGNOSIS — M25521 Pain in right elbow: Secondary | ICD-10-CM | POA: Diagnosis not present

## 2019-01-19 DIAGNOSIS — M25561 Pain in right knee: Secondary | ICD-10-CM | POA: Diagnosis not present

## 2019-01-19 DIAGNOSIS — M25562 Pain in left knee: Secondary | ICD-10-CM | POA: Diagnosis not present

## 2019-02-02 DIAGNOSIS — M25562 Pain in left knee: Secondary | ICD-10-CM | POA: Diagnosis not present

## 2019-02-08 ENCOUNTER — Ambulatory Visit (INDEPENDENT_AMBULATORY_CARE_PROVIDER_SITE_OTHER): Payer: BLUE CROSS/BLUE SHIELD | Admitting: Orthopedic Surgery

## 2019-02-08 ENCOUNTER — Encounter (INDEPENDENT_AMBULATORY_CARE_PROVIDER_SITE_OTHER): Payer: Self-pay | Admitting: Orthopedic Surgery

## 2019-02-08 ENCOUNTER — Ambulatory Visit (INDEPENDENT_AMBULATORY_CARE_PROVIDER_SITE_OTHER): Payer: Self-pay

## 2019-02-08 VITALS — Ht 74.0 in | Wt 249.0 lb

## 2019-02-08 DIAGNOSIS — M25561 Pain in right knee: Secondary | ICD-10-CM

## 2019-02-08 DIAGNOSIS — M1711 Unilateral primary osteoarthritis, right knee: Secondary | ICD-10-CM | POA: Diagnosis not present

## 2019-02-12 ENCOUNTER — Encounter (INDEPENDENT_AMBULATORY_CARE_PROVIDER_SITE_OTHER): Payer: Self-pay | Admitting: Orthopedic Surgery

## 2019-02-12 DIAGNOSIS — M1711 Unilateral primary osteoarthritis, right knee: Secondary | ICD-10-CM | POA: Diagnosis not present

## 2019-02-12 MED ORDER — LIDOCAINE HCL 1 % IJ SOLN
5.0000 mL | INTRAMUSCULAR | Status: AC | PRN
  Administered 2019-02-12: 5 mL

## 2019-02-12 MED ORDER — METHYLPREDNISOLONE ACETATE 40 MG/ML IJ SUSP
40.0000 mg | INTRAMUSCULAR | Status: AC | PRN
  Administered 2019-02-12: 40 mg via INTRA_ARTICULAR

## 2019-02-12 MED ORDER — BUPIVACAINE HCL 0.25 % IJ SOLN
4.0000 mL | INTRAMUSCULAR | Status: AC | PRN
  Administered 2019-02-12: 4 mL via INTRA_ARTICULAR

## 2019-02-12 NOTE — Progress Notes (Signed)
Office Visit Note   Patient: Kristopher BEATO Sr.           Date of Birth: [birth year]           MRN: [id number] Visit Date: 02/08/2019 Requested by: Daylene Katayama, PA [address] [place], [place] [address] PCP: Daylene Katayama, PA  Subjective: Chief Complaint  Patient presents with  . Right Knee - Pain    HPI: Patient presents for evaluation of right knee.  Pain started this weekend.  He does have a history of gout.  He wonders if he is overcompensating for the left knee.  Both knees have end-stage arthritis but the left knee is worse.  He is tried Aleve and ice without much relief.  He is using a cane.  He would like to request that the [place] approve his surgery for total knee replacement.  He does work second shift as an Artist.  He is 74 inches 249 pounds.              ROS: All systems reviewed are negative as they relate to the chief complaint within the history of present illness.  Patient denies  fevers or chills.   Assessment & Plan: Visit Diagnoses:  1. Acute pain of right knee     Plan: Impression is bilateral knee arthritis with aggravation of that right knee which potentially could be a gout flare.  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  Plan is to aspirate and inject the right knee today per patient request.  We also want to try to get him approved for left total knee replacement through the [facility] choice program.  I think he would be a good candidate and based on the amount of arthritis that is present and failure of conservative management he would do well with knee replacement.  Follow-Up Instructions: No follow-ups on file.   Orders:  Orders Placed This Encounter  Procedures  . XR KNEE 3 VIEW RIGHT   No orders of the defined types were placed in this encounter.     Procedures: Large Joint Inj: R knee on 02/12/2019 8:36 PM Indications: diagnostic evaluation, joint swelling and  pain Details: 18 G 1.5 in needle, superolateral approach  Arthrogram: No  Medications: 5 mL lidocaine 1 %; 40 mg methylPREDNISolone acetate 40 MG/ML; 4 mL bupivacaine 0.25 % Aspirate: 20 mL yellow Outcome: tolerated well, no immediate complications Procedure, treatment alternatives, risks and benefits explained, specific risks discussed. Consent was given by the patient. Immediately prior to procedure a time out was called to verify the correct patient, procedure, equipment, support staff and site/side marked as required. Patient was prepped and draped in the usual sterile fashion.       Clinical Data: No additional findings.  Objective: Vital Signs: Ht 6\' 2"  (1.88 m)   Wt 249 lb (112.9 kg)   BMI 31.97 kg/m   Physical Exam:   Constitutional: Patient appears well-developed HEENT:  Head: Normocephalic Eyes:EOM are normal Neck: Normal range of motion Cardiovascular: Normal rate Pulmonary/chest: Effort normal Neurologic: Patient is alert Skin: Skin is warm Psychiatric: Patient has normal mood and affect    Ortho Exam: Ortho exam demonstrates full active and passive range of motion of the hips and ankles.  Both knees have restricted motion with flexion just past 90 on the right-hand side and on the left-hand side also just past 90.  Collaterals are stable.  Pedal pulses palpable.  He has patellofemoral crepitus as well as medial and lateral joint line tenderness in both knees.  Patient is requesting that I would look into obtaining [facility] approval for left total knee replacement.  The fluid aspirated from the right knee today did not look like gout fluid.  Specialty Comments:  No specialty comments available.  Imaging: No results found.   PMFS History: Patient Active Problem List   Diagnosis Date Noted  . Pain and swelling of ankle, right 12/09/2017  . Hyperlipidemia with target LDL less than 100 06/18/2017  . Hypertensive heart disease without heart failure 06/18/2017  .  Bilateral renal cysts 06/18/2017  . Resistant hypertension 05/18/2017  . H/O hypokalemia 05/18/2017  . Framingham cardiac risk 10-20% in next 10 years 03/17/2017  . Diverticulosis of large intestine without hemorrhage 09/09/2016  . Benign essential hypertension 07/15/2016  . Acute bilateral ankle pain 09/04/2015  . Closed nondisplaced fracture of navicular bone of left foot 09/04/2015  . Pain in both feet 09/04/2015  . Primary osteoarthritis of both knees 05/30/2015  . Arthralgia of both knees 01/05/2015  . Arthralgia of left hip 01/05/2015  . Abnormal EKG 06/18/2014  . Knee pain 03/28/2014   Past Medical History:  Diagnosis Date  . Arthritis   . Hyperlipidemia   . Hypertension     Family History  Problem Relation Age of Onset  . Cancer Mother   . Cancer Father   . Cancer Sister   . Hypertension Sister   . Cancer Brother   . Hypertension Brother   . Cancer Sister   . Hypertension Sister     Past Surgical History:  Procedure Laterality Date  . HAND SURGERY    . KNEE ARTHROSCOPY Left   . RENAL ARTERY DOPPLERS  05/29/2017    Normal Renal A Bilaterally.  L > R kidney (3.2 cm) . Renal Cysts noted  - larges ~3.7 cm x 3.5 cm (R kidney), L kidney - ~1.7 cm x 1.6 cm -> recommend referral to Nephrology  . TRANSTHORACIC ECHOCARDIOGRAM  05/2017   Normal Echo. Mod Conc LVH. EF 55-60%.  No RWMA. Normal Valves. Gr 1 DD.   Social History   Occupational History  . Not on file  Tobacco Use  . Smoking status: Never Smoker  . Smokeless tobacco: Never Used  Substance and Sexual Activity  . Alcohol use: Yes    Comment: occasional  . Drug use: No  . Sexual activity: Not on file

## 2019-03-12 ENCOUNTER — Other Ambulatory Visit: Payer: Self-pay | Admitting: Cardiology

## 2019-03-14 DIAGNOSIS — E119 Type 2 diabetes mellitus without complications: Secondary | ICD-10-CM | POA: Diagnosis not present

## 2019-03-15 DIAGNOSIS — M1A3390 Chronic gout due to renal impairment, unspecified wrist, without tophus (tophi): Secondary | ICD-10-CM | POA: Diagnosis not present

## 2019-03-15 DIAGNOSIS — E119 Type 2 diabetes mellitus without complications: Secondary | ICD-10-CM | POA: Diagnosis not present

## 2019-03-15 DIAGNOSIS — M170 Bilateral primary osteoarthritis of knee: Secondary | ICD-10-CM | POA: Diagnosis not present

## 2019-04-25 DIAGNOSIS — N182 Chronic kidney disease, stage 2 (mild): Secondary | ICD-10-CM | POA: Diagnosis not present

## 2019-04-25 DIAGNOSIS — M109 Gout, unspecified: Secondary | ICD-10-CM | POA: Diagnosis not present

## 2019-04-25 DIAGNOSIS — E785 Hyperlipidemia, unspecified: Secondary | ICD-10-CM | POA: Diagnosis not present

## 2019-04-25 DIAGNOSIS — E876 Hypokalemia: Secondary | ICD-10-CM | POA: Diagnosis not present

## 2019-04-25 DIAGNOSIS — R7303 Prediabetes: Secondary | ICD-10-CM | POA: Diagnosis not present

## 2019-04-25 DIAGNOSIS — I10 Essential (primary) hypertension: Secondary | ICD-10-CM | POA: Diagnosis not present

## 2019-04-25 DIAGNOSIS — N281 Cyst of kidney, acquired: Secondary | ICD-10-CM | POA: Diagnosis not present

## 2019-05-02 DIAGNOSIS — M109 Gout, unspecified: Secondary | ICD-10-CM | POA: Diagnosis not present

## 2019-05-24 DIAGNOSIS — M1A3320 Chronic gout due to renal impairment, left wrist, without tophus (tophi): Secondary | ICD-10-CM | POA: Diagnosis not present

## 2019-05-26 DIAGNOSIS — M7989 Other specified soft tissue disorders: Secondary | ICD-10-CM | POA: Diagnosis not present

## 2019-06-24 DIAGNOSIS — M1A39X0 Chronic gout due to renal impairment, multiple sites, without tophus (tophi): Secondary | ICD-10-CM | POA: Diagnosis not present

## 2019-06-24 DIAGNOSIS — N183 Chronic kidney disease, stage 3 (moderate): Secondary | ICD-10-CM | POA: Diagnosis not present

## 2019-06-27 DIAGNOSIS — M1A3320 Chronic gout due to renal impairment, left wrist, without tophus (tophi): Secondary | ICD-10-CM | POA: Diagnosis not present

## 2019-07-05 ENCOUNTER — Other Ambulatory Visit: Payer: Self-pay | Admitting: Cardiology

## 2019-07-28 ENCOUNTER — Ambulatory Visit (INDEPENDENT_AMBULATORY_CARE_PROVIDER_SITE_OTHER): Payer: BC Managed Care – PPO | Admitting: Orthopedic Surgery

## 2019-07-28 ENCOUNTER — Ambulatory Visit: Payer: Self-pay

## 2019-07-28 ENCOUNTER — Encounter: Payer: Self-pay | Admitting: Physician Assistant

## 2019-07-28 ENCOUNTER — Other Ambulatory Visit: Payer: Self-pay

## 2019-07-28 VITALS — Ht 74.0 in | Wt 249.0 lb

## 2019-07-28 DIAGNOSIS — G8929 Other chronic pain: Secondary | ICD-10-CM

## 2019-07-28 DIAGNOSIS — M25561 Pain in right knee: Secondary | ICD-10-CM | POA: Diagnosis not present

## 2019-07-28 DIAGNOSIS — M1A0690 Idiopathic chronic gout, unspecified knee, without tophus (tophi): Secondary | ICD-10-CM | POA: Diagnosis not present

## 2019-07-28 DIAGNOSIS — M170 Bilateral primary osteoarthritis of knee: Secondary | ICD-10-CM | POA: Diagnosis not present

## 2019-07-28 DIAGNOSIS — M25562 Pain in left knee: Secondary | ICD-10-CM

## 2019-07-28 NOTE — Progress Notes (Signed)
Office Visit Note   Patient: Kristopher YASUI Sr.           Date of Birth: [birth year]           MRN: [id number] Visit Date: 07/28/2019              Requested by: Virginia Rochester, [place] [address],  [place] [address] PCP: Virginia Rochester, [place]  Chief Complaint  Patient presents with   Right Knee - Pain   Left Knee - Pain      HPI: Patient is a 58 year old gentleman who is seen for initial evaluation for severe osteoarthritis both knees.  Patient states that he also has gout he is treated at [facility].  Patient states that he is going to tried a obtain [facility] after the first of the year.  Assessment & Plan: Visit Diagnoses:  1. Chronic pain of both knees   2. Primary osteoarthritis of both knees   3. 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  Patient was given a note stating that he cannot climb up and down ladders until after he has regained strength of his total knee replacements.  Patient would be unsafe on a ladder at this time.  Follow-Up Instructions: Return if symptoms worsen or fail to improve.   Ortho Exam  Patient is alert, oriented, no adenopathy, well-dressed, normal affect, normal respiratory effort. Examination patient has difficulty getting from a sitting to a standing position.  He circumduct his legs for ambulation.  He has an effusion of both knees left worse than right.  He has range of motion of 10 to 110 degrees bilaterally he has no extensor lag collaterals and cruciates are stable there is crepitation with range of motion he has varus alignment with both knees.  Imaging: Xr Knee 1-2 Views Left  Result Date: 07/28/2019 2 view radiographs of the left knee shows complete collapse of the joint with deformity subcondylar sclerosis subcondylar cyst periarticular bony spurs with multiple calcified loose  bodies a large 1 in the popliteal fossa intramedullary calcified mass in the distal femur as well as a calcified mass in the quad tendon.  Xr Knee 1-2 Views Right  Result Date: 07/28/2019 2 view radiographs of the right knee shows tricompartmental osteoarthritis varus alignment with bone-on-bone contact medially periarticular bony spurs subchondral sclerosis cysts as well as multiple loose bodies.  No images are attached to the encounter.  Labs: Lab Results  Component Value Date   ESRSEDRATE 25 (H) 02/10/2016   LABURIC 9.5 (H) 11/07/2016   REPTSTATUS 02/14/2016 FINAL 02/10/2016   GRAMSTAIN  02/10/2016    NO ORGANISMS SEEN WBC PRESENT, PREDOMINANTLY PMN Gram Stain Report Called to,Read Back By and Verified With: S THORNTON AT 1124 ON 02.12.2017 BY NBROOKS    CULT  02/10/2016    NO GROWTH 3 DAYS Performed at [facility]      Lab Results  Component Value Date   ALBUMIN 3.8 10/09/2018   ALBUMIN 4.0 06/18/2017   LABURIC 9.5 (H) 11/07/2016    No results found for: MG No results found for: VD25OH  No results found for: PREALBUMIN CBC EXTENDED Latest Ref Rng & Units 10/09/2018 04/28/2018 11/07/2016  WBC 4.0 - 10.5 K/uL 10.6(H) 8.8 12.4(A)  RBC 4.22 - 5.81 MIL/uL 4.03(L) 4.53 4.43(A)  HGB 13.0 - 17.0 g/dL 11.4(L) 12.7(L) 12.9(A)  HCT 39.0 -  52.0 % 36.0(L) 39.8 36.9(A)  PLT 150 - 400 K/uL 362 350 -  NEUTROABS 1.7 - 7.7 K/uL 7.3 - -  LYMPHSABS 0.7 - 4.0 K/uL 2.0 - -     Body mass index is 31.97 kg/m.  Orders:  Orders Placed This Encounter  Procedures   XR Knee 1-2 Views Right   XR Knee 1-2 Views Left   No orders of the defined types were placed in this encounter.    Procedures: No procedures performed  Clinical Data: No additional findings.  ROS:  All other systems negative, except as noted in the HPI. Review of Systems  Objective: Vital Signs: Ht 6\' 2"  (1.88 m)    Wt 249 lb (112.9 kg)    BMI 31.97 kg/m   Specialty Comments:  No specialty  comments available.  PMFS History: Patient Active Problem List   Diagnosis Date Noted   Pain and swelling of ankle, right 12/09/2017   Hyperlipidemia with target LDL less than 100 06/18/2017   Hypertensive heart disease without heart failure 06/18/2017   Bilateral renal cysts 06/18/2017   Resistant hypertension 05/18/2017   H/O hypokalemia 05/18/2017   [place] cardiac risk 10-20% in next 10 years 03/17/2017   Diverticulosis of large intestine without hemorrhage 09/09/2016   Benign essential hypertension 07/15/2016   Acute bilateral ankle pain 09/04/2015   Closed nondisplaced fracture of navicular bone of left foot 09/04/2015   Pain in both feet 09/04/2015   Primary osteoarthritis of both knees 05/30/2015   Arthralgia of both knees 01/05/2015   Arthralgia of left hip 01/05/2015   Abnormal EKG 06/18/2014   Knee pain 03/28/2014   Past Medical History:  Diagnosis Date   Arthritis    Hyperlipidemia    Hypertension     Family History  Problem Relation Age of Onset   Cancer Mother    Cancer Father    Cancer Sister    Hypertension Sister    Cancer Brother    Hypertension Brother    Cancer Sister    Hypertension Sister     Past Surgical History:  Procedure Laterality Date   HAND SURGERY     KNEE ARTHROSCOPY Left    RENAL ARTERY DOPPLERS  05/29/2017    Normal Renal A Bilaterally.  L > R kidney (3.2 cm) . Renal Cysts noted  - larges ~3.7 cm x 3.5 cm (R kidney), L kidney - ~1.7 cm x 1.6 cm -> recommend referral to Nephrology   TRANSTHORACIC ECHOCARDIOGRAM  05/2017   Normal Echo. Mod Conc LVH. EF 55-60%.  No RWMA. Normal Valves. Gr 1 DD.   Social History   Occupational History   Not on file  Tobacco Use   Smoking status: Never Smoker   Smokeless tobacco: Never Used  Substance and Sexual Activity   Alcohol use: Yes    Comment: occasional   Drug use: No   Sexual activity: Not on file

## 2019-08-05 DIAGNOSIS — M170 Bilateral primary osteoarthritis of knee: Secondary | ICD-10-CM | POA: Diagnosis not present

## 2019-08-05 DIAGNOSIS — I129 Hypertensive chronic kidney disease with stage 1 through stage 4 chronic kidney disease, or unspecified chronic kidney disease: Secondary | ICD-10-CM | POA: Diagnosis not present

## 2019-08-05 DIAGNOSIS — N189 Chronic kidney disease, unspecified: Secondary | ICD-10-CM | POA: Diagnosis not present

## 2019-08-09 ENCOUNTER — Telehealth: Payer: Self-pay | Admitting: Orthopedic Surgery

## 2019-08-09 NOTE — Telephone Encounter (Signed)
FYI

## 2019-08-09 NOTE — Telephone Encounter (Signed)
Patient called to say that he would like to schedule surgery with Dr. Marlou Sa for his left knee.  He states his services are handle through the [place], [place] office, but he has an appointment with an orthopedist through the [place] in [place] and since they are not currently doing surgeries (due to Covid),  they will probably send a referral to an "outside" doctor.  He is requesting Dr. Marlou Sa.  I have provided the patient with my direct number and fax number so that we can receive communication and  authorization for services and surgery. According to Anne Ng in [employer] department, we are not able expedite these services, but must be initiated by the patient.   Patient will call after his ortho visit and provide update.

## 2019-08-09 NOTE — Telephone Encounter (Signed)
Ok thx.

## 2019-08-22 ENCOUNTER — Telehealth: Payer: Self-pay | Admitting: Orthopedic Surgery

## 2019-08-22 NOTE — Telephone Encounter (Signed)
I am not sure.  Jackelyn Poling may have to investigate that through the [place].  Just not sure if the [facility] covers skilled nursing or home health.  Typically I think either 1 or 2 nights is pretty standard for the hospital stay

## 2019-08-22 NOTE — Telephone Encounter (Signed)
Please advise. Thanks.  

## 2019-08-22 NOTE — Telephone Encounter (Signed)
Patient calling in regards to papers from [facility] that he is requesting to be filled in and signed by physician when he goes out for his knee replacement in September. I explained to the patient these type forms are generally brought in by patient, mailed, or faxed to our office; however, [employer] will handle the forms and will charge a processing fee of $25 that will need to be paid by the patient prior to processing.  Patient would like to know if this fee applies to veterans or if this is paid by the [place].   Patient is also asking the number of days he will be in the hospital for his total knee and where he will go for recovery since his girlfriend works 2nd shift.  He also mentions with him being a large man (260 lbs.) and his girlfriend weighing barely over 100 lbs, he is not able to rely on her to physically assist him without possible causing injury to her.   The procedure is scheduled as TBA and has been authorized through the [place]. Typically the case workers in the hospital will assist patient in finding a facility for post operative care if skilled nursing is necessary.  Patient would like to know if these services are covered by the [place].  If they are inclusive, he would like to know in advance which facility he can go to prior to finding out the day he is discharged.

## 2019-08-23 NOTE — Telephone Encounter (Signed)
IC s/w patient and advised per Dr Marlou Sa. Do you know what is usually covered by [facility] SNF vs HHPT?

## 2019-08-25 NOTE — Telephone Encounter (Signed)
Abby O'donnell at the [facility] [phone] left a voicemail message regarding patient's referral to Dr. Marlou Sa and wanted to know date of initial visit and surgery date.  She also verified receipt of CPM order going to [place].  I asked about skilled Nursing.  Abby stated typically the patient stays a night or two at the hospital and should go home directly without the need of skilled nursing unless the patient has other medical issues that will not allow this.  Patient must have the surgery first and then be evaluated by the physical therapist at the hospital.  Skilled nursing cannot and should not be determined prior to this procedure.  Patient has been assigned a Education officer, museum at the [place]. The name is Kellie Simmering  [phone] ext [phone].  Should skilled nursing be necessary, the hospital social worker should notify the [place] social worker assigned to patient in order to initiate facility care coordination and discharge plan.

## 2019-08-31 NOTE — Telephone Encounter (Signed)
Per Jackelyn Poling this is being addressed. Patient is ok with going home after surgery and we are working on PT auth for post op.

## 2019-09-01 ENCOUNTER — Telehealth: Payer: Self-pay | Admitting: Orthopedic Surgery

## 2019-09-01 DIAGNOSIS — I10 Essential (primary) hypertension: Secondary | ICD-10-CM | POA: Diagnosis not present

## 2019-09-01 DIAGNOSIS — N182 Chronic kidney disease, stage 2 (mild): Secondary | ICD-10-CM | POA: Diagnosis not present

## 2019-09-01 DIAGNOSIS — E876 Hypokalemia: Secondary | ICD-10-CM | POA: Diagnosis not present

## 2019-09-01 DIAGNOSIS — M109 Gout, unspecified: Secondary | ICD-10-CM | POA: Diagnosis not present

## 2019-09-01 DIAGNOSIS — E785 Hyperlipidemia, unspecified: Secondary | ICD-10-CM | POA: Diagnosis not present

## 2019-09-01 DIAGNOSIS — R7303 Prediabetes: Secondary | ICD-10-CM | POA: Diagnosis not present

## 2019-09-01 DIAGNOSIS — N281 Cyst of kidney, acquired: Secondary | ICD-10-CM | POA: Diagnosis not present

## 2019-09-01 NOTE — Telephone Encounter (Signed)
I called Kellie Simmering (social worker with [facility] [place]) [phone] ext [phone] and left message again regarding the request and authorization for in home physical therapy services post operatively. Patient's left total knee surgery is Sept 15th. If there is a particular form that needs to be completed and sent in prior to patient being admitted for surgery, I will need instruction and a point of contact.   I spoke with Ruby Cola at [facility] and the [place] has received our request for the CPM machine. Ruby Cola has already been contacted by the [place] and CPM authorization has been approved.

## 2019-09-01 NOTE — Pre-Procedure Instructions (Signed)
[id number] [place], [place] [address] [place] [address] Phone: [phone] Fax: [phone]    Your procedure is scheduled on Tues., Sept. 15, 2020 from 12:00PM-3:00PM  Report to [facility] Entrance "A" at 10:00AM  Call this number if you have problems the morning of surgery:  [phone]   Remember:  Do not eat or drink after midnight on Sept. 14th    Take these medicines the morning of surgery with A SIP OF WATER: Allopurinol (ZYLOPRIM)  AmLODipine (NORVASC)   Atorvastatin (LIPITOR) Bystolic  7 days before surgery (09/06/19), stop taking all Aspirin (unless instructed by your doctor) and Other Aspirin containing products, Vitamins, Fish oils, and Herbal medications. Also stop all NSAIDS i.e. Advil, Ibuprofen, Motrin, Aleve, Anaprox, Naproxen, BC, Goody Powders, and all Supplements.   Special instructions:  [place]- Preparing For Surgery  Before surgery, you can play an important role. Because skin is not sterile, your skin needs to be as free of germs as possible. You can reduce the number of germs on your skin by washing with CHG (chlorahexidine gluconate) Soap before surgery.  CHG is an antiseptic cleaner which kills germs and bonds with the skin to continue killing germs even after washing.    Please do not use if you have an allergy to CHG or antibacterial soaps. If your skin becomes reddened/irritated stop using the CHG.  Do not shave (including legs and underarms) for at least 48 hours prior to first CHG shower. It is OK to shave your face.  Please follow these instructions carefully.   1. Shower the NIGHT BEFORE SURGERY and the MORNING OF SURGERY with CHG.   2. If you chose to wash your hair, wash your hair first as usual with your normal shampoo.  3. After you shampoo, rinse your hair and body thoroughly to remove the shampoo.  4. Use CHG as you would any other  liquid soap. You can apply CHG directly to the skin and wash gently with a scrungie or a clean washcloth.   5. Apply the CHG Soap to your body ONLY FROM THE NECK DOWN.  Do not use on open wounds or open sores. Avoid contact with your eyes, ears, mouth and genitals (private parts). Wash Face and genitals (private parts)  with your normal soap.  6. Wash thoroughly, paying special attention to the area where your surgery will be performed.  7. Thoroughly rinse your body with warm water from the neck down.  8. DO NOT shower/wash with your normal soap after using and rinsing off the CHG Soap.  9. Pat yourself dry with a CLEAN TOWEL.  10. Wear CLEAN PAJAMAS to bed the night before surgery, wear comfortable clothes the morning of surgery  11. Place CLEAN SHEETS on your bed the night of your first shower and DO NOT SLEEP WITH PETS.   Day of Surgery:             Remember to brush your teeth WITH YOUR REGULAR TOOTHPASTE.  Do not wear jewelry.  Do not wear lotions, powders, colognes, or deodorant.  Do not shave 48 hours prior to surgery.  Men may shave face and neck.  Do not bring valuables to the hospital.  [facility] is not responsible for any belongings or valuables.  Contacts, dentures or bridgework may not be worn into surgery.  For patients admitted to the hospital, discharge time will be  determined by your treatment team.  Patients discharged the day of surgery will not be allowed to drive home.   Please wear clean clothes to the hospital/surgery center.    Please read over the following fact sheets that you were given. Pain Booklet, Coughing and Deep Breathing, Total Joint Packet, MRSA Information and Surgical Site Infection Prevention

## 2019-09-01 NOTE — Telephone Encounter (Signed)
I had already spoken to Kristopher [name] about this and he advised me to contact the [place]. According to the referral, this patient is authorized for 15 visits for occupational therapy and 15 visits for physical therapy. Shall we assume in home patient physical therapy is included? If these visits exceed 15, we will need to notify the [facility] to request additional visits and provide them with supporting medical documentation. Form for additional services will remain with patient's surgery sheet should we need in the future.

## 2019-09-01 NOTE — Telephone Encounter (Signed)
The person/facility we typically use for HHPT is [facility] through [place] [phone] is his contact number

## 2019-09-02 ENCOUNTER — Encounter (HOSPITAL_COMMUNITY)
Admission: RE | Admit: 2019-09-02 | Discharge: 2019-09-02 | Disposition: A | Discharge status: Home / Self Care | Payer: BC Managed Care – PPO | Source: Ambulatory Visit | Attending: Orthopedic Surgery | Admitting: Orthopedic Surgery

## 2019-09-02 ENCOUNTER — Other Ambulatory Visit: Payer: Self-pay

## 2019-09-02 ENCOUNTER — Encounter (HOSPITAL_COMMUNITY): Payer: Self-pay

## 2019-09-02 DIAGNOSIS — Z01812 Encounter for preprocedural laboratory examination: Secondary | ICD-10-CM | POA: Insufficient documentation

## 2019-09-02 HISTORY — DX: Dependence on other enabling machines and devices: Z99.89

## 2019-09-02 HISTORY — DX: Gastro-esophageal reflux disease without esophagitis: K21.9

## 2019-09-02 HISTORY — DX: Other seasonal allergic rhinitis: J30.2

## 2019-09-02 HISTORY — DX: Chronic kidney disease, unspecified: N18.9

## 2019-09-02 LAB — CBC
HCT: 38.6 % — ABNORMAL LOW (ref 39.0–52.0)
Hemoglobin: 12.3 g/dL — ABNORMAL LOW (ref 13.0–17.0)
MCH: 29.5 pg (ref 26.0–34.0)
MCHC: 31.9 g/dL (ref 30.0–36.0)
MCV: 92.6 fL (ref 80.0–100.0)
Platelets: 329 10*3/uL (ref 150–400)
RBC: 4.17 MIL/uL — ABNORMAL LOW (ref 4.22–5.81)
RDW: 12.9 % (ref 11.5–15.5)
WBC: 7.2 10*3/uL (ref 4.0–10.5)
nRBC: 0 % (ref 0.0–0.2)

## 2019-09-02 LAB — SURGICAL PCR SCREEN
MRSA, PCR: NEGATIVE
Staphylococcus aureus: NEGATIVE

## 2019-09-02 LAB — BASIC METABOLIC PANEL
Anion gap: 8 (ref 5–15)
BUN: 35 mg/dL — ABNORMAL HIGH (ref 6–20)
CO2: 23 mmol/L (ref 22–32)
Calcium: 10 mg/dL (ref 8.9–10.3)
Chloride: 108 mmol/L (ref 98–111)
Creatinine, Ser: 1.58 mg/dL — ABNORMAL HIGH (ref 0.61–1.24)
GFR calc Af Amer: 55 mL/min — ABNORMAL LOW (ref >60)
GFR calc non Af Amer: 47 mL/min — ABNORMAL LOW (ref >60)
Glucose, Bld: 127 mg/dL — ABNORMAL HIGH (ref 70–99)
Potassium: 4.4 mmol/L (ref 3.5–5.1)
Sodium: 139 mmol/L (ref 135–145)

## 2019-09-02 NOTE — Telephone Encounter (Signed)
Yes, that's who we use. And more than likely, HHPT will not get to 15 visits. They usually do about 6-8

## 2019-09-02 NOTE — Progress Notes (Signed)
Patient denies shortness of breath, fever, cough and chest pain at PAT appointment  PCP - [facility] [place] - Dr Glenetta Hew [facility] - Dr Donato Heinz  Chest x-ray - 10/09/18 EKG - 10/10/18 Stress Test - yrs ago, unknown location, normal per pt. ECHO - 05/29/17 Cardiac Cath - Denies  Anesthesia review: Yes  7 days prior to surgery STOP taking any Aspirin (unless otherwise instructed by your surgeon), Aleve, Naproxen, Ibuprofen, Motrin, Advil, Goody's, BC's, all herbal medications, fish oil, and all vitamins.   Coronavirus Screening Have you or your son/friend experienced the following symptoms:  Cough yes/no: No Fever (>100.57F)  yes/no: No Runny nose yes/no: No Sore throat yes/no: No Difficulty breathing/shortness of breath  yes/no: No  Have you or your son/friend traveled in the last 14 days and where? yes/no: No

## 2019-09-02 NOTE — Pre-Procedure Instructions (Signed)
[id number] [place] [place] - [address] [facility] [id number] [place] [id number] Phone: [phone] Fax: [phone]    Your procedure is scheduled on Tues., Sept. 15, 2020 from 12:00PM-3:00PM  [facility]A" at 10:00AM  Call this number if you have problems the morning of surgery:  [phone]   Remember:  Do not eat or drink after midnight on Sept. 14th    Take these medicines the morning of surgery with A SIP OF WATER: Allopurinol (ZYLOPRIM)    Atorvastatin (LIPITOR) Bystolic acetaminophen (TYLENOL)  If needed hydrALAZINE (APRESOLINE)   7 days before surgery (09/06/19), stop taking all Aspirin (unless instructed by your doctor) and Other Aspirin containing products, Vitamins, Fish oils, and Herbal medications. Also stop all NSAIDS i.e. Advil, Ibuprofen, Motrin, Aleve, Anaprox, Naproxen, BC, Goody Powders, and all Supplements.   Special instructions:  [place]- Preparing For Surgery  Before surgery, you can play an important role. Because skin is not sterile, your skin needs to be as free of germs as possible. You can reduce the number of germs on your skin by washing with CHG (chlorahexidine gluconate) Soap before surgery.  CHG is an antiseptic cleaner which kills germs and bonds with the skin to continue killing germs even after washing.    Please do not use if you have an allergy to CHG or antibacterial soaps. If your skin becomes reddened/irritated stop using the CHG.  Do not shave (including legs and underarms) for at least 48 hours prior to first CHG shower. It is OK to shave your face.  Please follow these instructions carefully.   1. Shower the NIGHT BEFORE SURGERY(Mon) and the MORNING OF SURGERY(Tues) with CHG.   2. If you chose to wash your hair, wash your hair first as usual with your normal shampoo.  3. After you shampoo, rinse your hair and body thoroughly to remove the  shampoo.  4. Use CHG as you would any other liquid soap. You can apply CHG directly to the skin and wash gently with a scrungie or a clean washcloth.   5. Apply the CHG Soap to your body ONLY FROM THE NECK DOWN.  Do not use on open wounds or open sores. Avoid contact with your eyes, ears, mouth and genitals (private parts). Wash Face and genitals (private parts)  with your normal soap.  6. Wash thoroughly, paying special attention to the area where your surgery will be performed.  7. Thoroughly rinse your body with warm water from the neck down.  8. DO NOT shower/wash with your normal soap after using and rinsing off the CHG Soap.  9. Pat yourself dry with a CLEAN TOWEL.  10. Wear CLEAN PAJAMAS to bed the night before surgery, wear comfortable clothes the morning of surgery  11. Place CLEAN SHEETS on your bed the night of your first shower and DO NOT SLEEP WITH PETS.   Day of Surgery:             Remember to brush your teeth WITH YOUR REGULAR TOOTHPASTE.  Do not wear jewelry.  Do not wear lotions, powders, colognes, or deodorant.  Do not shave 48 hours prior to surgery.  Men may shave face and neck.  Do not bring valuables to the hospital.  [facility] is not responsible for any belongings or valuables.  Contacts, dentures or bridgework may not be worn into surgery.  For patients admitted to the hospital, discharge time  will be determined by your treatment team.  Patients discharged the day of surgery will not be allowed to drive home.   Please wear clean clothes to the hospital/surgery center.    Please read over the following fact sheets that you were given. Pain Booklet, Coughing and Deep Breathing, Total Joint Packet, MRSA Information and Surgical Site Infection Prevention

## 2019-09-06 ENCOUNTER — Encounter (HOSPITAL_COMMUNITY): Payer: Self-pay

## 2019-09-06 NOTE — Anesthesia Preprocedure Evaluation (Addendum)
Anesthesia Evaluation  Patient identified by MRN, date of birth, ID band Patient awake    Reviewed: Allergy & Precautions, NPO status , Patient's Chart, lab work & pertinent test results, reviewed documented beta blocker date and time   Airway Mallampati: II  TM Distance: >3 FB Neck ROM: Full    Dental  (+) Chipped,    Pulmonary neg pulmonary ROS,    Pulmonary exam normal breath sounds clear to auscultation       Cardiovascular hypertension, Pt. on medications and Pt. on home beta blockers Normal cardiovascular exam Rhythm:Regular Rate:Normal  ECG: SR,rate 10  Cardiologist - Dr Glenetta Hew   Neuro/Psych negative neurological ROS  negative psych ROS   GI/Hepatic negative GI ROS, Neg liver ROS,   Endo/Other  negative endocrine ROS  Renal/GU Renal InsufficiencyRenal disease     Musculoskeletal  (+) Arthritis ,   Abdominal (+) + obese,   Peds  Hematology  (+) anemia , HLD   Anesthesia Other Findings left knee osteoarthritis  Reproductive/Obstetrics                          Anesthesia Physical Anesthesia Plan  ASA: II  Anesthesia Plan: Regional and Spinal   Post-op Pain Management:  Regional for Post-op pain   Induction: Intravenous  PONV Risk Score and Plan: 1 and Ondansetron, Dexamethasone, Midazolam and Treatment may vary due to age or medical condition  Airway Management Planned: Simple Face Mask  Additional Equipment:   Intra-op Plan:   Post-operative Plan:   Informed Consent: I have reviewed the patients History and Physical, chart, labs and discussed the procedure including the risks, benefits and alternatives for the proposed anesthesia with the patient or authorized representative who has indicated his/her understanding and acceptance.     Dental advisory given  Plan Discussed with: CRNA  Anesthesia Plan Comments: (Reviewed PAT note written 09/06/2019 by Myra Gianotti, PA-C. )       Anesthesia Quick Evaluation

## 2019-09-06 NOTE — Progress Notes (Signed)
Anesthesia Chart Review:  Case: [id number] Date/Time: 09/13/19 1145   Procedure: LEFT TOTAL KNEE ARTHROPLASTY (Left )   Anesthesia type: Spinal   Pre-op diagnosis: left knee osteoarthritis   Location: [facility] OR ROOM 06 / [place] [facility]   Surgeon: Meredith Pel, MD      DISCUSSION: Patient is a 58 year old male scheduled for the above procedure.  History includes never smoker, HTN, HLD, CKD (stage III, due to HTN), GERD. BMI is consistent with obesity.  Preoperative labs show overall stable renal function with known CKD stage III. He was last seen by nephrology in April 2020.   He was referred to cardiologist Dr. Ellyn Hack in 2018 for difficult to treat HTN. EKG showed LVH with stable repolarization changes since 2015. Echo ordered to get baseline function and showed normal LVEF with no wall motion abnormalities, grade 1 diastolic dysfunction. Renal US also ordered which did not show any RAS, but did show renal cysts, so he was referred to nephrology. Medication changes made and has had overall improvement in BP control. He had ED visits on 04/28/18 and 10/09/18 for atypical chest pain, felt to be musculoskeletal in nature. Last visit with Dr. Ellyn Hack was on 06/30/18 following one of those visits. No additional cardiac testing ordered.   He denied SOB, cough, fever, chest pain at PAT RN visit. PAT BP reasonable at 148/86. Based on currently available information, I would anticipate that he can proceed as planned if no acute changes and BP not significantly elevated on the day of surgery. Discussed with anesthesiologist Oren Bracket, MD.    Presurgical COVID-19 test is scheduled for 09/09/19.   VS: BP (!) 148/86   Pulse 64   Temp 36.5 C   Resp 20   Ht '6\' 3"'$  (1.905 m)   Wt 126 kg   SpO2 95%   BMI 34.71 kg/m    PROVIDERS: Virginia Rochester, PA is listed as PCP ([facility]) - Glenetta Hew, MD is cardiologist. Last visit 06/30/18. Donato Heinz, MD is nephrologist. 4/27/220 note by Juanell Fairly, NP-C scanned under Media tab. BUN 32, Cr 1.50, eGFR 58 at that time.  - Rheumatologist is with [facility]. Seen on 06/24/19 by Tana Coast, PA-C for chronic gout. (See Care Everywhere)   LABS: Labs reviewed: Acceptable for surgery. Creatinine overall stable. (all labs ordered are listed, but only abnormal results are displayed)  Labs Reviewed  CBC - Abnormal; Notable for the following components:      Result Value   RBC 4.17 (*)    Hemoglobin 12.3 (*)    HCT 38.6 (*)    All other components within normal limits  BASIC METABOLIC PANEL - Abnormal; Notable for the following components:   Glucose, Bld 127 (*)    BUN 35 (*)    Creatinine, Ser 1.58 (*)    GFR calc non Af Amer 47 (*)    GFR calc Af Amer 55 (*)    All other components within normal limits  SURGICAL PCR SCREEN     IMAGES: CXR 10/09/18: FINDINGS: Heart size is upper normal, stable. Overall cardiomediastinal silhouette is stable. Lungs are clear. No pleural effusion or pneumothorax seen. Osseous structures about the chest are unremarkable. IMPRESSION: No active cardiopulmonary disease. No evidence of pneumonia or pulmonary edema.   EKG: 10/09/18: Sinus rhythm Left ventricular hypertrophy Confirmed by Virgel Manifold [id number]) on 10/09/2018 3:32:10 PM   CV: Renal US 06/04/17: Impressions Technically challenging study. Normal caliber abdominal aorta. Normal right kidney size. Large left  kidney, which measures 3.2 cm larger than the right kidney. Normal renal arteries, bilaterally. The IVC and renal veins are patent. Multiple cysts in the right kidney, with the largest measuring 3.7 cm x 3.5 cm. Cyst in the left kidney measures 1.7 cm x 1.6 cm.   Echo 05/29/17: Study Conclusions - Left ventricle: There was moderate concentric hypertrophy.   Systolic function was normal. The estimated ejection fraction was   in the range of 55% to 60%. Wall motion was normal; there were no   regional wall motion abnormalities.  Doppler parameters are   consistent with abnormal left ventricular relaxation (grade 1   diastolic dysfunction). - Aortic valve: There was mild regurgitation. - Left atrium: The atrium was mildly dilated. - Right ventricle: The cavity size was mildly dilated. - Right atrium: The atrium was mildly dilated. - Atrial septum: No defect or patent foramen ovale was identified.   He reported a normal stress test "years ago", unknown location.   Past Medical History:  Diagnosis Date  . Ambulates with cane   . Arthritis   . CKD (chronic kidney disease)    stage III (nephrologist: Donato Heinz, MD) 03/2019  . GERD (gastroesophageal reflux disease)   . Hyperlipidemia   . Hypertension   . Seasonal allergies     Past Surgical History:  Procedure Laterality Date  . COLONOSCOPY    . HAND SURGERY Left 1983  . KNEE ARTHROSCOPY Left 1986  . RENAL ARTERY DOPPLERS  05/29/2017    Normal Renal A Bilaterally.  L > R kidney (3.2 cm) . Renal Cysts noted  - larges ~3.7 cm x 3.5 cm (R kidney), L kidney - ~1.7 cm x 1.6 cm -> recommend referral to Nephrology  . TRANSTHORACIC ECHOCARDIOGRAM  05/2017   Normal Echo. Mod Conc LVH. EF 55-60%.  No RWMA. Normal Valves. Gr 1 DD.  Marland Kitchen UPPER GI ENDOSCOPY      MEDICATIONS: . acetaminophen (TYLENOL) 650 MG CR tablet  . allopurinol (ZYLOPRIM) 100 MG tablet  . atorvastatin (LIPITOR) 10 MG tablet  . BYSTOLIC 20 MG TABS  . chlorthalidone (HYGROTON) 25 MG tablet  . chlorthalidone (HYGROTON) 50 MG tablet  . [START ON 09/12/2019] hydrALAZINE (APRESOLINE) 25 MG tablet  . losartan (COZAAR) 25 MG tablet  . naproxen sodium (ALEVE) 220 MG tablet  . potassium chloride (MICRO-K) 10 MEQ CR capsule  . predniSONE (DELTASONE) 10 MG tablet  . spironolactone (ALDACTONE) 50 MG tablet   No current facility-administered medications for this encounter.   He is taking chlorthalidone 50 mg (and not the 25 mg) daily. He takes prednisone PRN gout flare.    Myra Gianotti,  PA-C Surgical Short Stay/Anesthesiology [facility] Phone [phone] [facility] Phone [phone] 09/06/2019 2:55 PM

## 2019-09-09 ENCOUNTER — Other Ambulatory Visit (HOSPITAL_COMMUNITY)
Admission: RE | Admit: 2019-09-09 | Discharge: 2019-09-09 | Disposition: A | Discharge status: Home / Self Care | Payer: BC Managed Care – PPO | Source: Ambulatory Visit | Attending: Orthopedic Surgery | Admitting: Orthopedic Surgery

## 2019-09-09 ENCOUNTER — Other Ambulatory Visit: Payer: Self-pay

## 2019-09-09 DIAGNOSIS — Z20828 Contact with and (suspected) exposure to other viral communicable diseases: Secondary | ICD-10-CM | POA: Diagnosis not present

## 2019-09-09 DIAGNOSIS — Z01812 Encounter for preprocedural laboratory examination: Secondary | ICD-10-CM | POA: Insufficient documentation

## 2019-09-10 LAB — NOVEL CORONAVIRUS, NAA (HOSP ORDER, SEND-OUT TO REF LAB; TAT 18-24 HRS): SARS-CoV-2, NAA: NOT DETECTED

## 2019-09-12 MED ORDER — DEXTROSE 5 % IV SOLN
3.0000 g | INTRAVENOUS | Status: AC
  Administered 2019-09-13: 3 g via INTRAVENOUS
  Filled 2019-09-12: qty 3000
  Filled 2019-09-12: qty 3

## 2019-09-13 ENCOUNTER — Other Ambulatory Visit: Payer: Self-pay

## 2019-09-13 ENCOUNTER — Inpatient Hospital Stay (HOSPITAL_COMMUNITY): Payer: No Typology Code available for payment source | Admitting: Anesthesiology

## 2019-09-13 ENCOUNTER — Encounter (HOSPITAL_COMMUNITY)
Admission: RE | Disposition: A | Discharge status: Home Health | Payer: Self-pay | Source: Home / Self Care | Attending: Orthopedic Surgery

## 2019-09-13 ENCOUNTER — Inpatient Hospital Stay (HOSPITAL_COMMUNITY)
Admission: RE | Admit: 2019-09-13 | Discharge: 2019-09-15 | DRG: 470 | Disposition: A | Discharge status: Home Health | Payer: No Typology Code available for payment source | Attending: Orthopedic Surgery | Admitting: Orthopedic Surgery

## 2019-09-13 ENCOUNTER — Encounter (HOSPITAL_COMMUNITY): Payer: Self-pay

## 2019-09-13 ENCOUNTER — Inpatient Hospital Stay (HOSPITAL_COMMUNITY): Payer: No Typology Code available for payment source | Admitting: Vascular Surgery

## 2019-09-13 DIAGNOSIS — M1712 Unilateral primary osteoarthritis, left knee: Secondary | ICD-10-CM | POA: Diagnosis not present

## 2019-09-13 DIAGNOSIS — Z8249 Family history of ischemic heart disease and other diseases of the circulatory system: Secondary | ICD-10-CM

## 2019-09-13 DIAGNOSIS — I130 Hypertensive heart and chronic kidney disease with heart failure and stage 1 through stage 4 chronic kidney disease, or unspecified chronic kidney disease: Secondary | ICD-10-CM | POA: Diagnosis present

## 2019-09-13 DIAGNOSIS — K219 Gastro-esophageal reflux disease without esophagitis: Secondary | ICD-10-CM | POA: Diagnosis present

## 2019-09-13 DIAGNOSIS — E785 Hyperlipidemia, unspecified: Secondary | ICD-10-CM | POA: Diagnosis present

## 2019-09-13 DIAGNOSIS — N183 Chronic kidney disease, stage 3 (moderate): Secondary | ICD-10-CM | POA: Diagnosis present

## 2019-09-13 DIAGNOSIS — M2342 Loose body in knee, left knee: Secondary | ICD-10-CM | POA: Diagnosis present

## 2019-09-13 DIAGNOSIS — M659 Synovitis and tenosynovitis, unspecified: Secondary | ICD-10-CM | POA: Diagnosis present

## 2019-09-13 HISTORY — PX: TOTAL KNEE ARTHROPLASTY: SHX125

## 2019-09-13 LAB — URINALYSIS, ROUTINE W REFLEX MICROSCOPIC
Bilirubin Urine: NEGATIVE
Glucose, UA: NEGATIVE mg/dL
Hgb urine dipstick: NEGATIVE
Ketones, ur: NEGATIVE mg/dL
Leukocytes,Ua: NEGATIVE
Nitrite: NEGATIVE
Protein, ur: NEGATIVE mg/dL
Specific Gravity, Urine: 1.017 (ref 1.005–1.030)
pH: 5 (ref 5.0–8.0)

## 2019-09-13 SURGERY — ARTHROPLASTY, KNEE, TOTAL
Anesthesia: Regional | Laterality: Left

## 2019-09-13 MED ORDER — POVIDONE-IODINE 10 % EX SWAB: 2.0000 "application " | Freq: Once | CUTANEOUS | Status: DC

## 2019-09-13 MED ORDER — BUPIVACAINE-EPINEPHRINE (PF) 0.25% -1:200000 IJ SOLN
INTRAMUSCULAR | Status: AC
  Filled 2019-09-13: qty 30

## 2019-09-13 MED ORDER — HYDROMORPHONE HCL 1 MG/ML IJ SOLN
0.2500 mg | INTRAMUSCULAR | Status: DC | PRN
  Administered 2019-09-13: 0.5 mg via INTRAVENOUS

## 2019-09-13 MED ORDER — PHENOL 1.4 % MT LIQD: 1.0000 | OROMUCOSAL | Status: DC | PRN

## 2019-09-13 MED ORDER — MIDAZOLAM HCL 2 MG/2ML IJ SOLN
1.0000 mg | Freq: Once | INTRAMUSCULAR | Status: AC
  Administered 2019-09-13: 12:00:00 1 mg via INTRAVENOUS

## 2019-09-13 MED ORDER — ATORVASTATIN CALCIUM 10 MG PO TABS
10.0000 mg | ORAL_TABLET | Freq: Every day | ORAL | Status: DC
  Administered 2019-09-13 – 2019-09-14 (×2): 10 mg via ORAL
  Filled 2019-09-13 (×2): qty 1

## 2019-09-13 MED ORDER — ROPIVACAINE HCL 5 MG/ML IJ SOLN
INTRAMUSCULAR | Status: DC | PRN
  Administered 2019-09-13: 30 mL via PERINEURAL

## 2019-09-13 MED ORDER — PROPOFOL 500 MG/50ML IV EMUL
INTRAVENOUS | Status: DC | PRN
  Administered 2019-09-13: 100 ug/kg/min via INTRAVENOUS

## 2019-09-13 MED ORDER — PROPOFOL 1000 MG/100ML IV EMUL
INTRAVENOUS | Status: AC
  Filled 2019-09-13: qty 100

## 2019-09-13 MED ORDER — METHOCARBAMOL 1000 MG/10ML IJ SOLN
500.0000 mg | Freq: Four times a day (QID) | INTRAVENOUS | Status: DC | PRN
  Filled 2019-09-13: qty 5

## 2019-09-13 MED ORDER — TRANEXAMIC ACID-NACL 1000-0.7 MG/100ML-% IV SOLN
1000.0000 mg | INTRAVENOUS | Status: AC
  Administered 2019-09-13: 1000 mg via INTRAVENOUS
  Filled 2019-09-13: qty 100

## 2019-09-13 MED ORDER — 0.9 % SODIUM CHLORIDE (POUR BTL) OPTIME
TOPICAL | Status: DC | PRN
  Administered 2019-09-13: 1000 mL

## 2019-09-13 MED ORDER — ONDANSETRON HCL 4 MG/2ML IJ SOLN
INTRAMUSCULAR | Status: DC | PRN
  Administered 2019-09-13: 4 mg via INTRAVENOUS

## 2019-09-13 MED ORDER — ACETAMINOPHEN 325 MG PO TABS: 325.0000 mg | ORAL_TABLET | Freq: Four times a day (QID) | ORAL | Status: DC | PRN

## 2019-09-13 MED ORDER — OXYCODONE HCL 5 MG/5ML PO SOLN: 5.0000 mg | Freq: Once | ORAL | Status: AC | PRN

## 2019-09-13 MED ORDER — ASPIRIN 81 MG PO CHEW
81.0000 mg | CHEWABLE_TABLET | Freq: Two times a day (BID) | ORAL | Status: DC
  Administered 2019-09-13 – 2019-09-14 (×3): 81 mg via ORAL
  Filled 2019-09-13 (×4): qty 1

## 2019-09-13 MED ORDER — SODIUM CHLORIDE 0.9 % IV SOLN
INTRAVENOUS | Status: DC | PRN
  Administered 2019-09-13: 40 ug/min via INTRAVENOUS

## 2019-09-13 MED ORDER — CHLORHEXIDINE GLUCONATE 4 % EX LIQD: 60.0000 mL | Freq: Once | CUTANEOUS | Status: DC

## 2019-09-13 MED ORDER — BUPIVACAINE LIPOSOME 1.3 % IJ SUSP
20.0000 mL | Freq: Once | INTRAMUSCULAR | Status: DC
  Filled 2019-09-13: qty 20

## 2019-09-13 MED ORDER — MENTHOL 3 MG MT LOZG: 1.0000 | LOZENGE | OROMUCOSAL | Status: DC | PRN

## 2019-09-13 MED ORDER — SODIUM CHLORIDE 0.9% FLUSH
INTRAVENOUS | Status: DC | PRN
  Administered 2019-09-13: 10 mL

## 2019-09-13 MED ORDER — MORPHINE SULFATE (PF) 4 MG/ML IV SOLN
INTRAVENOUS | Status: AC
  Filled 2019-09-13: qty 2

## 2019-09-13 MED ORDER — PROPOFOL 10 MG/ML IV BOLUS
INTRAVENOUS | Status: DC | PRN
  Administered 2019-09-13: 20 mg via INTRAVENOUS
  Administered 2019-09-13 (×2): 30 mg via INTRAVENOUS

## 2019-09-13 MED ORDER — CLONIDINE HCL (ANALGESIA) 100 MCG/ML EP SOLN
EPIDURAL | Status: DC | PRN
  Administered 2019-09-13: 1 mL

## 2019-09-13 MED ORDER — HYDROMORPHONE HCL 1 MG/ML IJ SOLN
INTRAMUSCULAR | Status: AC
  Filled 2019-09-13: qty 1

## 2019-09-13 MED ORDER — LACTATED RINGERS IV SOLN
INTRAVENOUS | Status: DC
  Administered 2019-09-13 (×2): via INTRAVENOUS

## 2019-09-13 MED ORDER — TRANEXAMIC ACID 1000 MG/10ML IV SOLN
2000.0000 mg | Freq: Once | INTRAVENOUS | Status: DC
  Filled 2019-09-13: qty 20

## 2019-09-13 MED ORDER — METHOCARBAMOL 500 MG PO TABS
500.0000 mg | ORAL_TABLET | Freq: Four times a day (QID) | ORAL | Status: DC | PRN
  Administered 2019-09-13 – 2019-09-14 (×3): 500 mg via ORAL
  Filled 2019-09-13 (×4): qty 1

## 2019-09-13 MED ORDER — MIDAZOLAM HCL 2 MG/2ML IJ SOLN
INTRAMUSCULAR | Status: AC
  Administered 2019-09-13: 1 mg via INTRAVENOUS
  Filled 2019-09-13: qty 2

## 2019-09-13 MED ORDER — SPIRONOLACTONE 50 MG PO TABS
50.0000 mg | ORAL_TABLET | Freq: Every day | ORAL | Status: DC
  Administered 2019-09-14: 50 mg via ORAL
  Filled 2019-09-13 (×3): qty 2
  Filled 2019-09-13 (×3): qty 1

## 2019-09-13 MED ORDER — BUPIVACAINE IN DEXTROSE 0.75-8.25 % IT SOLN
INTRATHECAL | Status: DC | PRN
  Administered 2019-09-13: 2 mL via INTRATHECAL

## 2019-09-13 MED ORDER — CLONIDINE HCL (ANALGESIA) 100 MCG/ML EP SOLN
EPIDURAL | Status: AC
  Filled 2019-09-13: qty 10

## 2019-09-13 MED ORDER — ONDANSETRON HCL 4 MG/2ML IJ SOLN
INTRAMUSCULAR | Status: AC
  Filled 2019-09-13: qty 2

## 2019-09-13 MED ORDER — DOCUSATE SODIUM 100 MG PO CAPS
100.0000 mg | ORAL_CAPSULE | Freq: Two times a day (BID) | ORAL | Status: DC
  Administered 2019-09-13 – 2019-09-14 (×3): 100 mg via ORAL
  Filled 2019-09-13 (×4): qty 1

## 2019-09-13 MED ORDER — FENTANYL CITRATE (PF) 100 MCG/2ML IJ SOLN
INTRAMUSCULAR | Status: AC
  Filled 2019-09-13: qty 2

## 2019-09-13 MED ORDER — ACETAMINOPHEN 500 MG PO TABS
ORAL_TABLET | ORAL | Status: AC
  Administered 2019-09-13: 1000 mg via ORAL
  Filled 2019-09-13: qty 2

## 2019-09-13 MED ORDER — METOCLOPRAMIDE HCL 5 MG PO TABS: 5.0000 mg | ORAL_TABLET | Freq: Three times a day (TID) | ORAL | Status: DC | PRN

## 2019-09-13 MED ORDER — BUPIVACAINE HCL 0.25 % IJ SOLN
INTRAMUSCULAR | Status: DC | PRN
  Administered 2019-09-13: 30 mL

## 2019-09-13 MED ORDER — DEXAMETHASONE SODIUM PHOSPHATE 10 MG/ML IJ SOLN
INTRAMUSCULAR | Status: DC | PRN
  Administered 2019-09-13: 10 mg via INTRAVENOUS

## 2019-09-13 MED ORDER — LACTATED RINGERS IV SOLN: INTRAVENOUS | Status: AC

## 2019-09-13 MED ORDER — CELECOXIB 200 MG PO CAPS
200.0000 mg | ORAL_CAPSULE | Freq: Two times a day (BID) | ORAL | Status: DC
  Administered 2019-09-13 – 2019-09-14 (×3): 200 mg via ORAL
  Filled 2019-09-13 (×4): qty 1

## 2019-09-13 MED ORDER — ONDANSETRON HCL 4 MG/2ML IJ SOLN: 4.0000 mg | Freq: Four times a day (QID) | INTRAMUSCULAR | Status: DC | PRN

## 2019-09-13 MED ORDER — HYDRALAZINE HCL 25 MG PO TABS
25.0000 mg | ORAL_TABLET | Freq: Three times a day (TID) | ORAL | Status: DC
  Administered 2019-09-13 – 2019-09-15 (×5): 25 mg via ORAL
  Filled 2019-09-13 (×6): qty 1

## 2019-09-13 MED ORDER — METOCLOPRAMIDE HCL 5 MG/ML IJ SOLN: 5.0000 mg | Freq: Three times a day (TID) | INTRAMUSCULAR | Status: DC | PRN

## 2019-09-13 MED ORDER — ONDANSETRON HCL 4 MG PO TABS
4.0000 mg | ORAL_TABLET | Freq: Four times a day (QID) | ORAL | Status: DC | PRN
  Administered 2019-09-15: 4 mg via ORAL
  Filled 2019-09-13: qty 1

## 2019-09-13 MED ORDER — OXYCODONE HCL 5 MG PO TABS
5.0000 mg | ORAL_TABLET | Freq: Once | ORAL | Status: AC | PRN
  Administered 2019-09-13: 5 mg via ORAL

## 2019-09-13 MED ORDER — CEFAZOLIN SODIUM-DEXTROSE 2-4 GM/100ML-% IV SOLN
2.0000 g | Freq: Four times a day (QID) | INTRAVENOUS | Status: AC
  Administered 2019-09-13 (×2): 2 g via INTRAVENOUS
  Filled 2019-09-13 (×2): qty 100

## 2019-09-13 MED ORDER — DEXAMETHASONE SODIUM PHOSPHATE 10 MG/ML IJ SOLN
INTRAMUSCULAR | Status: AC
  Filled 2019-09-13: qty 1

## 2019-09-13 MED ORDER — MORPHINE SULFATE (PF) 4 MG/ML IV SOLN
INTRAVENOUS | Status: DC | PRN
  Administered 2019-09-13: 8 mg via SUBCUTANEOUS

## 2019-09-13 MED ORDER — ACETAMINOPHEN 500 MG PO TABS
1000.0000 mg | ORAL_TABLET | Freq: Once | ORAL | Status: AC
  Administered 2019-09-13: 11:00:00 1000 mg via ORAL

## 2019-09-13 MED ORDER — OXYCODONE HCL 5 MG PO TABS
5.0000 mg | ORAL_TABLET | ORAL | Status: DC | PRN
  Administered 2019-09-13 – 2019-09-14 (×5): 10 mg via ORAL
  Filled 2019-09-13 (×5): qty 2

## 2019-09-13 MED ORDER — KETOROLAC TROMETHAMINE 30 MG/ML IJ SOLN: 30.0000 mg | Freq: Once | INTRAMUSCULAR | Status: DC | PRN

## 2019-09-13 MED ORDER — PROMETHAZINE HCL 25 MG/ML IJ SOLN: 6.2500 mg | INTRAMUSCULAR | Status: DC | PRN

## 2019-09-13 MED ORDER — SODIUM CHLORIDE 0.9 % IR SOLN
Status: DC | PRN
  Administered 2019-09-13: 3000 mL

## 2019-09-13 MED ORDER — BUPIVACAINE LIPOSOME 1.3 % IJ SUSP
INTRAMUSCULAR | Status: DC | PRN
  Administered 2019-09-13: 20 mL

## 2019-09-13 MED ORDER — TRANEXAMIC ACID 1000 MG/10ML IV SOLN
INTRAVENOUS | Status: DC | PRN
  Administered 2019-09-13: 2000 mg via TOPICAL

## 2019-09-13 MED ORDER — HYDROMORPHONE HCL 1 MG/ML IJ SOLN
0.5000 mg | INTRAMUSCULAR | Status: DC | PRN
  Administered 2019-09-13: 1 mg via INTRAVENOUS
  Filled 2019-09-13: qty 1

## 2019-09-13 MED ORDER — OXYCODONE HCL 5 MG PO TABS
ORAL_TABLET | ORAL | Status: AC
  Filled 2019-09-13: qty 1

## 2019-09-13 MED ORDER — BUPIVACAINE HCL (PF) 0.25 % IJ SOLN
INTRAMUSCULAR | Status: AC
  Filled 2019-09-13: qty 30

## 2019-09-13 SURGICAL SUPPLY — 76 items
BAG DECANTER FOR FLEXI CONT (MISCELLANEOUS) ×2 IMPLANT
BANDAGE ESMARK 6X9 LF (GAUZE/BANDAGES/DRESSINGS) ×1 IMPLANT
BLADE SAG 18X100X1.27 (BLADE) ×3 IMPLANT
BNDG CMPR 9X6 STRL LF SNTH (GAUZE/BANDAGES/DRESSINGS) ×1
BNDG CMPR MED 15X6 ELC VLCR LF (GAUZE/BANDAGES/DRESSINGS) ×1
BNDG COHESIVE 6X5 TAN STRL LF (GAUZE/BANDAGES/DRESSINGS) ×2 IMPLANT
BNDG ELASTIC 6X15 VLCR STRL LF (GAUZE/BANDAGES/DRESSINGS) ×2 IMPLANT
BNDG ESMARK 6X9 LF (GAUZE/BANDAGES/DRESSINGS) ×2
BOWL SMART MIX CTS (DISPOSABLE) IMPLANT
BSPLAT TIB 7 KN TRITANIUM (Knees) ×1 IMPLANT
COMPONENT TRI CR RETAIN SZ6 LT (Orthopedic Implant) IMPLANT
CONT SPEC 4OZ CLIKSEAL STRL BL (MISCELLANEOUS) ×2 IMPLANT
COVER SURGICAL LIGHT HANDLE (MISCELLANEOUS) ×2 IMPLANT
COVER WAND RF STERILE (DRAPES) ×2 IMPLANT
CUFF TOURN SGL QUICK 34 (TOURNIQUET CUFF) ×2
CUFF TOURN SGL QUICK 42 (TOURNIQUET CUFF) IMPLANT
CUFF TRNQT CYL 34X4.125X (TOURNIQUET CUFF) ×1 IMPLANT
DECANTER SPIKE VIAL GLASS SM (MISCELLANEOUS) ×2 IMPLANT
DRAPE INCISE IOBAN 66X45 STRL (DRAPES) IMPLANT
DRAPE ORTHO SPLIT 77X108 STRL (DRAPES) ×6
DRAPE SURG ORHT 6 SPLT 77X108 (DRAPES) ×3 IMPLANT
DRAPE U-SHAPE 47X51 STRL (DRAPES) ×2 IMPLANT
DRSG AQUACEL AG ADV 3.5X14 (GAUZE/BANDAGES/DRESSINGS) ×1 IMPLANT
DURAPREP 26ML APPLICATOR (WOUND CARE) ×4 IMPLANT
ELECT CAUTERY BLADE 6.4 (BLADE) ×2 IMPLANT
ELECT REM PT RETURN 9FT ADLT (ELECTROSURGICAL) ×2
ELECTRODE REM PT RTRN 9FT ADLT (ELECTROSURGICAL) ×1 IMPLANT
GAUZE SPONGE 4X4 12PLY STRL (GAUZE/BANDAGES/DRESSINGS) ×2 IMPLANT
GLOVE BIOGEL PI IND STRL 7.5 (GLOVE) ×1 IMPLANT
GLOVE BIOGEL PI IND STRL 8 (GLOVE) ×1 IMPLANT
GLOVE BIOGEL PI INDICATOR 7.5 (GLOVE) ×1
GLOVE BIOGEL PI INDICATOR 8 (GLOVE) ×1
GLOVE ECLIPSE 7.0 STRL STRAW (GLOVE) ×2 IMPLANT
GLOVE SURG ORTHO 8.0 STRL STRW (GLOVE) ×2 IMPLANT
GOWN STRL REUS W/ TWL LRG LVL3 (GOWN DISPOSABLE) ×3 IMPLANT
GOWN STRL REUS W/TWL LRG LVL3 (GOWN DISPOSABLE) ×6
HANDPIECE INTERPULSE COAX TIP (DISPOSABLE) ×2
HOOD PEEL AWAY FLYTE STAYCOOL (MISCELLANEOUS) ×6 IMPLANT
IMMOBILIZER KNEE 20 (SOFTGOODS)
IMMOBILIZER KNEE 20 THIGH 36 (SOFTGOODS) IMPLANT
IMMOBILIZER KNEE 22 UNIV (SOFTGOODS) IMPLANT
IMMOBILIZER KNEE 24 THIGH 36 (MISCELLANEOUS) IMPLANT
IMMOBILIZER KNEE 24 UNIV (MISCELLANEOUS)
INSERT TIB BEARING 7X12 (Insert) ×1 IMPLANT
KIT BASIN OR (CUSTOM PROCEDURE TRAY) ×2 IMPLANT
KIT TURNOVER KIT B (KITS) ×2 IMPLANT
KNEE PATELLA ASYMMETRIC 10X35 (Knees) ×1 IMPLANT
KNEE TIBIAL COMPONENT SZ7 (Knees) ×1 IMPLANT
MANIFOLD NEPTUNE II (INSTRUMENTS) ×2 IMPLANT
NDL SPNL 18GX3.5 QUINCKE PK (NEEDLE) ×1 IMPLANT
NEEDLE 22X1 1/2 (OR ONLY) (NEEDLE) ×4 IMPLANT
NEEDLE SPNL 18GX3.5 QUINCKE PK (NEEDLE) ×2 IMPLANT
NS IRRIG 1000ML POUR BTL (IV SOLUTION) ×4 IMPLANT
PACK TOTAL JOINT (CUSTOM PROCEDURE TRAY) ×2 IMPLANT
PAD ARMBOARD 7.5X6 YLW CONV (MISCELLANEOUS) ×4 IMPLANT
PAD CAST 4YDX4 CTTN HI CHSV (CAST SUPPLIES) ×1 IMPLANT
PADDING CAST COTTON 4X4 STRL (CAST SUPPLIES) ×2
PADDING CAST COTTON 6X4 STRL (CAST SUPPLIES) ×2 IMPLANT
SET HNDPC FAN SPRY TIP SCT (DISPOSABLE) ×1 IMPLANT
STRIP CLOSURE SKIN 1/2X4 (GAUZE/BANDAGES/DRESSINGS) ×4 IMPLANT
SUCTION FRAZIER HANDLE 10FR (MISCELLANEOUS) ×1
SUCTION TUBE FRAZIER 10FR DISP (MISCELLANEOUS) ×1 IMPLANT
SUT MNCRL AB 3-0 PS2 18 (SUTURE) ×2 IMPLANT
SUT VIC AB 0 CT1 27 (SUTURE) ×6
SUT VIC AB 0 CT1 27XBRD ANBCTR (SUTURE) ×3 IMPLANT
SUT VIC AB 1 CT1 27 (SUTURE) ×12
SUT VIC AB 1 CT1 27XBRD ANBCTR (SUTURE) ×5 IMPLANT
SUT VIC AB 2-0 CT1 27 (SUTURE) ×8
SUT VIC AB 2-0 CT1 TAPERPNT 27 (SUTURE) ×4 IMPLANT
SYR 30ML LL (SYRINGE) ×6 IMPLANT
SYR TB 1ML LUER SLIP (SYRINGE) ×2 IMPLANT
TOWEL GREEN STERILE (TOWEL DISPOSABLE) ×4 IMPLANT
TOWEL GREEN STERILE FF (TOWEL DISPOSABLE) ×4 IMPLANT
TRAY CATH 16FR W/PLASTIC CATH (SET/KITS/TRAYS/PACK) IMPLANT
TRIATH CRUCIATE RETAIN SZ6 KNE (Orthopedic Implant) ×2 IMPLANT
WATER STERILE IRR 1000ML POUR (IV SOLUTION) IMPLANT

## 2019-09-13 NOTE — Op Note (Signed)
NAME: Kristopher PieriniCOURTNEY SR., Kristopher W. MEDICAL RECORD OZ:[id number] ACCOUNT [id number] DATE OF BIRTH:1961/10/06 FACILITY: [facility] LOCATION: [facility] PHYSICIAN:Kristopher Diamantina ProvidenceS. DEAN, MD  OPERATIVE REPORT  DATE OF PROCEDURE:  09/13/2019  PREOPERATIVE DIAGNOSIS:  Left knee arthritis.  POSTOPERATIVE DIAGNOSIS:  Left knee arthritis.  PROCEDURE:  Left total knee replacement using Stryker press-fit Triathlon knee, size 7 tibia, size 6 femur, 12 mm posterior cruciate retaining deep dish insert with 35 mm 3-peg patella.  The femoral component was also posterior cruciate retaining.  SURGEON:  Kristopher CopaGregory Scott Dean, MD  ASSISTANT:  Kristopher CaiLuke Magnant, PA.  INDICATIONS:  This is a 58 year old patient with severe end-stage left knee arthritis, who presents for operative management after explanation of risks and benefits.  PROCEDURE IN DETAIL:  The patient was brought to the operating room where spinal anesthetic was induced.  Preoperative antibiotics administered.  Timeout was called.  Left leg was prescrubbed with alcohol and Betadine, allowed to air dry prepped and  DuraPrep solution and draped in a sterile manner.  Ioban used to cover the operative field.  Timeout was called.  Leg was elevated and exsanguinated with the Esmarch wrap.  Tourniquet was inflated to 300 mmHg for a total tourniquet time of 101 minutes.   The patient's arthritis in his knee was severe.  Anterior approach to knee was made.  Skin and subcutaneous tissue were sharply divided.  Median parapatellar approach was made and marked with a #1 Vicryl suture.  Two large loose bodies, each measuring  approximately 4 x 4 x 2 cm were removed.  Fat pad was partially excised.  Lateral patellofemoral ligament was released.  Soft tissue from the anterior distal femur was removed.  Synovitis was present and that was removed as well.  Minimal medial soft  tissue stripping was performed, but it was performed enough order to facilitate protection of the medial collateral  ligament during the cuts.  At this time, intramedullary alignment was used to make a cut on the proximal tibia.  The patient did have a  significant cavitary defect on the medial tibial plateau.  Initial cut was made approximately at the level of the inferior aspect of the defect and later revised 2 more millimeters inferiorly.  This cut was made perpendicular to the mechanical axis with  the collaterals and posterior neurovascular structures protected.  The femoral cut was then made 8 mm.  This allowed for both a 9 and 11 mm spacer block to achieve full extension.  A little hyperextension with a 9 mm spacer block.  The tibia was then  sized to a size 7.  That required a size 6 femoral cut, which was slightly oversized, but nonetheless was in the end excellent.  The femur was then cut at a size 6.  The anterior, posterior and chamfer cuts were made with the collaterals protected.  The  tibia was then keel punched and trial reduction was performed with the size 6 femur and size 7 tibia.  The patient with an 11 mm spacer achieved full extension and had excellent patellar tracking.  Collaterals were stable at 0 and 30 degrees and 90  degrees to varus and valgus stress.  The patella was then cut down from 28-17 mm and a 35 mm 10 mm thick patella trial button was placed with excellent patellar tracking.  Trial components were removed at this time.  Thorough irrigation performed.   Irrisept solution was used at all times during the case to prevent infection.  TXA sponge was then placed within  the knee incision for 3 minutes.  The components were then press-fit into position with a 12 mm spacer utilized.  This gave excellent  stability to varus and valgus stress as well as excellent patellar tracking.  Tourniquet was released at this time.  Bleeding points were controlled using electrocautery.  The knee was then closed over a bolster using #1 Vicryl suture, reapproximating  the arthrotomy followed by interrupted  inverted 0 Vicryl suture, 2-0 Vicryl suture and a 3-0 Monocryl.  At this time, also the right knee was aspirated and 30 mL was aspirated out of the right knee.  The patient was then transferred to the recovery room  after a knee immobilizer placed.  He tolerated the procedure well without immediate complications.  Luke's assistance was required at all times during the case for retraction.  His assistance was of medical necessity.  TN/NUANCE  D:09/13/2019 T:09/13/2019 JOB:[id number]

## 2019-09-13 NOTE — Brief Op Note (Signed)
09/13/2019  3:19 PM  [name]:  Kristopher [name].  58 y.o. male  PRE-OPERATIVE DIAGNOSIS:  left knee osteoarthritis  POST-OPERATIVE DIAGNOSIS:  left knee osteoarthritis  PROCEDURE:  Procedure(s): LEFT TOTAL KNEE ARTHROPLASTY  SURGEON:  Surgeon(s): August Saucerean, Corrie MckusickGregory Scott, MD  ASSISTANT: magnant pa  ANESTHESIA:   spinal  EBL: 100 ml    Total I/O In: 1000 [I.V.:1000] Out: 100 [Blood:100]  BLOOD ADMINISTERED: none  DRAINS: none   LOCAL MEDICATIONS USED: Marcaine morphine clonidine Exparel  SPECIMEN:  No Specimen  COUNTS:  YES  TOURNIQUET:   Total Tourniquet Time Documented: Thigh (Left) - 101 minutes Total: Thigh (Left) - 101 minutes   DICTATION: .Other Dictation: Dictation Number setting up [name] preop diagnosis left knee arthritis postop diagnosis same procedure left total knee replacement using Stryker press-fit triathlon knee size 7 tibia size 6 femur 12 mm posterior cruciate retaining deep dish insert with 35 mm 3 peg patella the femoral component was also a posterior cruciate retaining surgeon 10 is having assist with magnet PA indications Kristopher [name] is a 58 year old [name] with severe end-stage left knee arthritis who presents for operative management after explanation of risks and [name].  Procedure in detail [name] brought to operating room where spinal anesthetic was induced prepped vivax Mr. timeout was called left leg was prescribed alcohol Betadine allowed to air dry prep DuraPrep solution draped in a sterile manner I been described operative field timeout was called leg was elevated and sent with the Esmarch wrap tourniquet was inflated 300 mmHg for total tourniquet time of 101 minutes.  The [name]'s arthritis in his knee was severe.  Anterior approach and he was made skin subtenons tissue sharply divided median parapatellar approach was made and marked with #1 Vicryl suture 2 large loose bodies each measuring approximately 5 x 5 cm home  measuring about 4 x 4 x 2 cm were removed.  Fat pad was partially excised lateral patellofemoral ligament was released soft tissue from the anterior distal femur was removed synovitis was present and that was removed as well.  Minimal medial soft tissue stripping was performed but it was performed enough in order to facilitate protection of the medial collateral ligament.  Cuts.  At this time at this time intramedullary line was used to make a cut on the proximal tibia.  The [name] did have a significant cavitary defect on the medial tibial plateau.  Initial cut was made approximately at the level of the inferior aspect of the defect and later revised 2 more millimeters inferiorly.  Cut was made perpendicular to the mechanical axis with the collaterals and posterior neurovascular structures were protected.  To the femoral cut was then made 8 mm.  This allowed for both 9 and 11 mm spacer block to to achieve full extension.  Little hyperextension with the 9 mm spacer block.  The the tibia was then sized to a size 7.  That required size 6 femoral cut which was slightly oversized but nonetheless was in the end excellent.  The femur was then cut To size 6.  Excellent press of the anterior posterior and chamfer cuts were made with the collaterals protected.  Tibia was then the keel punch and trial reduction was performed with a size 6 femur size 7 tibia.  [name] with an 11 mm spacer achieved full extension and had excellent patella tracking.  Collaterals are stable at 0 and 30 degrees in 90 degrees to varus valgus stress.  Patella was then cut down  from 28 to 17 mm and a 35 mm ten 3035 mm 10 mm thick patellar trial button was placed with excellent patella tracking.  Trial components removed at this time.  During irrigation performed here step solution was used at all times during the case to prevent infection.  TXA sponge was then placed within the knee incision for 3 minutes.  The components were then press-fit into  position with a 12 mm spacer utilized.  This gave excellent stability to varus valgus stress as well as excellent patella tracking.  Tourniquet was released at this time bleeding points encountered function Vicryl control use electrocautery knee was then closed over bolster using #1 Vicryl suture reapproximate the arthrotomy followed by interrupted inverted 0 flexor digitorum longus suture and 3-0 Monocryl.  At this time also the right knee was aspirated.,  She did get 30 cc should not more than that 30 cc was aspirated out of the right knee.  [name] was then transferred to the recovery room after knee immobilizer placed.  Kristopher [name] procedure well that immediate complications Kristopher [name] assistance required all times during the case for retraction.  His assistance was medical necessity.  Kristopher [name] dictating on [name] Kristopher [name]  [id number]  PLAN OF CARE: Admit to inpatient   [name] DISPOSITION:  PACU - hemodynamically stable

## 2019-09-13 NOTE — Anesthesia Procedure Notes (Signed)
Spinal  Patient location during procedure: OR Start time: 09/13/2019 12:20 PM End time: 09/13/2019 12:30 PM Staffing Anesthesiologist: Murvin Natal, MD Performed: anesthesiologist  Preanesthetic Checklist Completed: patient identified, surgical consent, pre-op evaluation, timeout performed, IV checked, risks and benefits discussed and monitors and equipment checked Spinal Block Patient position: sitting Prep: DuraPrep Patient monitoring: cardiac monitor, continuous pulse ox and blood pressure Approach: midline Location: L4-5 Injection technique: single-shot Needle Needle type: Quincke  Needle gauge: 22 G Needle length: 9 cm Assessment Sensory level: T10 Additional Notes Functioning IV was confirmed and monitors were applied. Sterile prep and drape, including hand hygiene and sterile gloves were used. The patient was positioned and the spine was prepped. The skin was anesthetized with lidocaine.  Free flow of clear CSF was obtained on the second attempt prior to injecting local anesthetic into the CSF.  The spinal needle aspirated freely following injection.  The needle was carefully withdrawn.  The patient tolerated the procedure well.

## 2019-09-13 NOTE — Progress Notes (Signed)
Orthopedic Tech Progress Note Patient Details:  Kristopher MCCUISTON Sr. [birth year] [id number]  CPM Left Knee CPM Left Knee: On Left Knee Flexion (Degrees): 40 Left Knee Extension (Degrees): 10 Additional Comments: Trapeze bar and foot roll  Post Interventions Patient Tolerated: Well Instructions Provided: Care of device  [place] Pink 09/13/2019, 4:27 PM

## 2019-09-13 NOTE — Anesthesia Procedure Notes (Signed)
Anesthesia Regional Block: Adductor canal block   Pre-Anesthetic Checklist: ,, timeout performed, Correct Patient, Correct Site, Correct Laterality, Correct Procedure,, site marked, risks and benefits discussed, Surgical consent,  Pre-op evaluation,  At surgeon's request and post-op pain management  Laterality: Left  Prep: chloraprep       Needles:  Injection technique: Single-shot  Needle Type: Echogenic Stimulator Needle     Needle Length: 9cm  Needle Gauge: 21     Additional Needles:   Procedures:,,,, ultrasound used (permanent image in chart),,,,  Narrative:  Start time: 09/13/2019 11:40 AM End time: 09/13/2019 11:50 AM Injection made incrementally with aspirations every 5 mL.  Performed by: Personally  Anesthesiologist: Murvin Natal, MD  Additional Notes: Functioning IV was confirmed and monitors were applied. A time-out was performed. Hand hygiene and sterile gloves were used. The thigh was placed in a frog-leg position and prepped in a sterile fashion. A 53mm 21ga Arrow echogenic stimulator needle was placed using ultrasound guidance.  Negative aspiration and negative test dose prior to incremental administration of local anesthetic. The patient tolerated the procedure well.

## 2019-09-13 NOTE — H&P (Signed)
TOTAL KNEE ADMISSION H&P  Patient is being admitted for left total knee arthroplasty.  Subjective:  Chief Complaint:left knee pain.  HPI: Kristopher [name] Sr., 58 y.o. male, has a history of pain and functional disability in the left knee due to arthritis and has failed non-surgical conservative treatments for greater than 12 weeks to includeNSAID's and/or analgesics, corticosteriod injections, flexibility and strengthening excercises and activity modification.  Onset of symptoms was gradual, starting >10 years ago with gradually worsening course since that time. The patient noted prior procedures on the knee to include  arthroscopy on the left knee(s).  Patient currently rates pain in the left knee(s) at 9 out of 10 with activity. Patient has night pain, worsening of pain with activity and weight bearing, pain that interferes with activities of daily living, pain with passive range of motion, crepitus and joint swelling.  Patient has evidence of subchondral sclerosis and joint space narrowing by imaging studies. This patient has had Longstanding left knee pain following basketball injury in high school many years ago.. There is no active infection.  Patient Active Problem List   Diagnosis Date Noted  . Pain and swelling of ankle, right 12/09/2017  . Hyperlipidemia with target LDL less than 100 06/18/2017  . Hypertensive heart disease without heart failure 06/18/2017  . Bilateral renal cysts 06/18/2017  . Resistant hypertension 05/18/2017  . H/O hypokalemia 05/18/2017  . Framingham cardiac risk 10-20% in next 10 years 03/17/2017  . Diverticulosis of large intestine without hemorrhage 09/09/2016  . Benign essential hypertension 07/15/2016  . Acute bilateral ankle pain 09/04/2015  . Closed nondisplaced fracture of navicular bone of left foot 09/04/2015  . Pain in both feet 09/04/2015  . Primary osteoarthritis of both knees 05/30/2015  . Arthralgia of both knees 01/05/2015  . Arthralgia of  left hip 01/05/2015  . Abnormal EKG 06/18/2014  . Knee pain 03/28/2014   Past Medical History:  Diagnosis Date  . Ambulates with cane   . Arthritis   . CKD (chronic kidney disease)    stage III (nephrologist: Donato Heinz, MD) 03/2019  . GERD (gastroesophageal reflux disease)   . Hyperlipidemia   . Hypertension   . Seasonal allergies     Past Surgical History:  Procedure Laterality Date  . COLONOSCOPY    . HAND SURGERY Left 1983  . KNEE ARTHROSCOPY Left 1986  . RENAL ARTERY DOPPLERS  05/29/2017    Normal Renal A Bilaterally.  L > R kidney (3.2 cm) . Renal Cysts noted  - larges ~3.7 cm x 3.5 cm (R kidney), L kidney - ~1.7 cm x 1.6 cm -> recommend referral to Nephrology  . TRANSTHORACIC ECHOCARDIOGRAM  05/2017   Normal Echo. Mod Conc LVH. EF 55-60%.  No RWMA. Normal Valves. Gr 1 DD.  Marland Kitchen UPPER GI ENDOSCOPY      Current Facility-Administered Medications  Medication Dose Route Frequency Provider Last Rate Last Dose  . ceFAZolin (ANCEF) 3 g in dextrose 5 % 50 mL IVPB  3 g Intravenous On Call to OR Meredith Pel, MD      . chlorhexidine (HIBICLENS) 4 % liquid 4 application  60 mL Topical Once Magnant, Charles L, PA-C      . chlorhexidine (HIBICLENS) 4 % liquid 4 application  60 mL Topical Once Magnant, Charles L, PA-C      . fentaNYL (SUBLIMAZE) 100 MCG/2ML injection           . lactated ringers infusion   Intravenous Continuous Ellender, Karyl Kinnier, MD  10 mL/hr at 09/13/19 1118    . midazolam (VERSED) 2 MG/2ML injection           . povidone-iodine 10 % swab 2 application  2 application Topical Once Magnant, Charles L, PA-C      . tranexamic acid (CYKLOKAPRON) IVPB 1,000 mg  1,000 mg Intravenous To OR August Saucerean, Corrie MckusickGregory Scott, MD       No Known Allergies  Social History   Tobacco Use  . Smoking status: Never Smoker  . Smokeless tobacco: Never Used  Substance Use Topics  . Alcohol use: Yes    Comment: occasional beer/wine    Family History  Problem Relation Age of Onset  .  Cancer Mother   . Cancer Father   . Cancer Sister   . Hypertension Sister   . Cancer Brother   . Hypertension Brother   . Cancer Sister   . Hypertension Sister      Review of Systems  Musculoskeletal: Positive for joint pain.  All other systems reviewed and are negative.   Objective:  Physical Exam  Constitutional: He appears well-developed.  HENT:  Head: Normocephalic.  Eyes: Pupils are equal, round, and reactive to light.  Neck: Normal range of motion.  Respiratory: Effort normal.  Neurological: He is alert.  Skin: Skin is warm.  Psychiatric: He has a normal mood and affect.  Left knee demonstrates mild effusion with 5 degree flexion contracture and flexion past 90.  Pedal pulses palpable.  No groin pain with internal or external rotation of the leg.  Extensor mechanism is intact.  Collaterals are stable.  Vital signs in last 24 hours: Temp:  [98 F (36.7 C)] 98 F (36.7 C) (09/15 1041) Pulse Rate:  [83] 83 (09/15 1041) Resp:  [20] 20 (09/15 1041) BP: (168)/(99) 168/99 (09/15 1041) SpO2:  [99 %] 99 % (09/15 1041) Weight:  [122.5 kg] 122.5 kg (09/15 1041)  Labs:   Estimated body mass index is 33.75 kg/m as calculated from the following:   Height as of this encounter: 6\' 3"  (1.905 m).   Weight as of this encounter: 122.5 kg.   Imaging Review Plain radiographs demonstrate severe degenerative joint disease of the bilaterally knee(s). The overall alignment ismild varus. The bone quality appears to be good for age and reported activity level.      Assessment/Plan:  End stage arthritis, left knee   The patient history, physical examination, clinical judgment of the provider and imaging studies are consistent with end stage degenerative joint disease of the left knee(s) and total knee arthroplasty is deemed medically necessary. The treatment options including medical management, injection therapy arthroscopy and arthroplasty were discussed at length. The risks and  benefits of total knee arthroplasty were presented and reviewed. The risks due to aseptic loosening, infection, stiffness, patella tracking problems, thromboembolic complications and other imponderables were discussed. The patient acknowledged the explanation, agreed to proceed with the plan and consent was signed. Patient is being admitted for inpatient treatment for surgery, pain control, PT, OT, prophylactic antibiotics, VTE prophylaxis, progressive ambulation and ADL's and discharge planning. The patient is planning to be discharged home with home health services     Patient's anticipated LOS is less than 2 midnights, meeting these requirements: - Younger than 4365 - Lives within 1 hour of care - Has a competent adult at home to recover with post-op recover - NO history of  - Chronic pain requiring opiods  - Diabetes  - Coronary Artery Disease  - Heart failure  -  Heart attack  - Stroke  - DVT/VTE  - Cardiac arrhythmia  - Respiratory Failure/COPD  - Renal failure  - Anemia  - Advanced Liver disease

## 2019-09-13 NOTE — Evaluation (Signed)
Physical Therapy Evaluation Patient Details Name: Kristopher PISTILLI Sr. MRN: [id number] DOB: August 06, 1961 Today's Date: 09/13/2019   History of Present Illness  Pt is a 58 y/o male s/p L TKA. PMH includes CKD, and HTN.   Clinical Impression  Pt is s/p surgery above with deficits below. Pt limited secondary to pain this session. Required min to min guard for mobility tasks using RW. Educated about knee precautions and supine HEP. Will continue to follow acutely to maximize functional mobility independence and safety.     Follow Up Recommendations Follow surgeon's recommendation for DC plan and follow-up therapies;Supervision for mobility/OOB    Equipment Recommendations  Rolling walker with 5" wheels;3in1 (PT)    Recommendations for Other Services       Precautions / Restrictions Precautions Precautions: Knee Precaution Booklet Issued: No Precaution Comments: Reviewed knee precautions wtih pt.  Restrictions Weight Bearing Restrictions: Yes LLE Weight Bearing: Weight bearing as tolerated      Mobility  Bed Mobility Overal bed mobility: Needs Assistance Bed Mobility: Supine to Sit     Supine to sit: Min assist;HOB elevated     General bed mobility comments: Min A for LLE assist. Increased time required to come to EOB.   Transfers Overall transfer level: Needs assistance Equipment used: Rolling walker (2 wheeled) Transfers: Sit to/from Stand Sit to Stand: Min assist;From elevated surface         General transfer comment: Min A for lift assist and steadying. Cues for safe hand placement.   Ambulation/Gait Ambulation/Gait assistance: Min guard Gait Distance (Feet): 5 Feet Assistive device: Rolling walker (2 wheeled) Gait Pattern/deviations: Step-through pattern;Decreased step length - right;Decreased step length - left;Decreased weight shift to left;Antalgic Gait velocity: Decreased   General Gait Details: Slow, antlagic gait. Distance limited to chair secondary to  pain. requried cues for sequencing when using RW.   Stairs            Wheelchair Mobility    Modified Rankin (Stroke Patients Only)       Balance Overall balance assessment: Needs assistance Sitting-balance support: No upper extremity supported;Feet supported Sitting balance-Leahy Scale: Good     Standing balance support: Bilateral upper extremity supported;During functional activity Standing balance-Leahy Scale: Poor Standing balance comment: Reliant on UE support                              Pertinent Vitals/Pain Pain Assessment: 0-10 Pain Score: 8  Pain Location: L knee Pain Descriptors / Indicators: Aching;Operative site guarding Pain Intervention(s): Limited activity within patient's tolerance;Monitored during session;Repositioned;Patient requesting pain meds-RN notified    Home Living Family/patient expects to be discharged to:: Private residence Living Arrangements: Non-relatives/Friends;Children Available Help at Discharge: Family;Friend(s);Available PRN/intermittently Type of Home: House Home Access: Stairs to enter Entrance Stairs-Rails: None Entrance Stairs-Number of Steps: 2 Home Layout: One level Home Equipment: Cane - single point      Prior Function Level of Independence: Independent with assistive device(s)         Comments: reports occasional use of cane when pain was severe.      Hand Dominance        Extremity/Trunk Assessment   Upper Extremity Assessment Upper Extremity Assessment: Overall WFL for tasks assessed    Lower Extremity Assessment Lower Extremity Assessment: LLE deficits/detail LLE Deficits / Details: Deficits consistent with post op pain and weakness.     Cervical / Trunk Assessment Cervical / Trunk Assessment: Normal  Communication  Communication: No difficulties  Cognition Arousal/Alertness: Awake/alert Behavior During Therapy: WFL for tasks assessed/performed Overall Cognitive Status: Within  Functional Limits for tasks assessed                                        General Comments      Exercises Total Joint Exercises Ankle Circles/Pumps: AROM;Both;20 reps Quad Sets: AROM;Left;10 reps   Assessment/Plan    PT Assessment Patient needs continued PT services  PT Problem List Decreased strength;Decreased range of motion;Decreased activity tolerance;Decreased balance;Decreased mobility;Decreased knowledge of use of DME;Decreased knowledge of precautions;Pain       PT Treatment Interventions DME instruction;Gait training;Stair training;Functional mobility training;Therapeutic activities;Therapeutic exercise;Balance training;Patient/family education    PT Goals (Current goals can be found in the Care Plan section)  Acute Rehab PT Goals Patient Stated Goal: to decrease pain PT Goal Formulation: With patient Time For Goal Achievement: 09/27/19 Potential to Achieve Goals: Good    Frequency 7X/week   Barriers to discharge        Co-evaluation               AM-PAC PT "6 Clicks" Mobility  Outcome Measure Help needed turning from your back to your side while in a flat bed without using bedrails?: A Little Help needed moving from lying on your back to sitting on the side of a flat bed without using bedrails?: A Little Help needed moving to and from a bed to a chair (including a wheelchair)?: A Little Help needed standing up from a chair using your arms (e.g., wheelchair or bedside chair)?: A Little Help needed to walk in hospital room?: A Little Help needed climbing 3-5 steps with a railing? : A Lot 6 Click Score: 17    End of Session Equipment Utilized During Treatment: Gait belt Activity Tolerance: Patient limited by pain Patient left: in chair;with call bell/phone within reach Nurse Communication: Mobility status;Patient requests pain meds PT Visit Diagnosis: Unsteadiness on feet (R26.81);Other abnormalities of gait and mobility  (R26.89);Pain Pain - Right/Left: Left Pain - part of body: Knee    Time: [id number] PT Time Calculation (min) (ACUTE ONLY): 21 min   Charges:   PT Evaluation $PT Eval Low Complexity: 1 Low          Kristopher [name], PT, DPT  Acute Rehabilitation Services  Pager: [phone] Office: [phone]   Kristopher [name] 09/13/2019, 6:43 PM

## 2019-09-13 NOTE — Transfer of Care (Signed)
Immediate Anesthesia Transfer of Care Note  Patient: Kristopher PEROTTI Sr.  Procedure(s) Performed: LEFT TOTAL KNEE ARTHROPLASTY (Left )  Patient Location: PACU  Anesthesia Type:MAC combined with regional for post-op pain  Level of Consciousness: awake, alert  and patient cooperative  Airway & Oxygen Therapy: Patient Spontanous Breathing  Post-op Assessment: Report given to RN and Post -op Vital signs reviewed and stable  Post vital signs: Reviewed and stable  Last Vitals:  Vitals Value Taken Time  BP    Temp    Pulse 72 09/13/19 1535  Resp 13 09/13/19 1535  SpO2 95 % 09/13/19 1535  Vitals shown include unvalidated device data.  Last Pain:  Vitals:   09/13/19 1109  TempSrc:   PainSc: 6       Patients Stated Pain Goal: 2 (14/27/67 0110)  Complications: No apparent anesthesia complications

## 2019-09-14 ENCOUNTER — Encounter (HOSPITAL_COMMUNITY): Payer: Self-pay | Admitting: Orthopedic Surgery

## 2019-09-14 LAB — URINE CULTURE: Culture: NO GROWTH

## 2019-09-14 NOTE — Progress Notes (Signed)
  Subjective: Kristopher CARITHERS Sr. is a 58 y.o. male s/p left TKA.  They are POD1.  Pt's pain is controlled.   Pt has not ambulated yet in PT.   Objective: Vital signs in last 24 hours: Temp:  [97 F (36.1 C)-98.1 F (36.7 C)] 97.6 F (36.4 C) (09/16 0330) Pulse Rate:  [64-83] 67 (09/16 0330) Resp:  [14-20] 18 (09/16 0330) BP: (118-168)/(77-99) 162/98 (09/16 0330) SpO2:  [94 %-100 %] 99 % (09/16 0330) Weight:  [122.5 kg] 122.5 kg (09/15 1635)  Intake/Output from previous day: 09/15 0701 - 09/16 0700 In: 1402.5 [I.V.:1302.5; IV Piggyback:100] Out: 7741 [Urine:1450; Blood:100] Intake/Output this shift: No intake/output data recorded.  Exam:  No gross blood or drainage overlying the dressing 2+ DP pulse Sensation intact distally in the left foot Able to dorsiflex and plantarflex the left foot   Labs: No results for input(s): HGB in the last 72 hours. No results for input(s): WBC, RBC, HCT, PLT in the last 72 hours. No results for input(s): NA, K, CL, CO2, BUN, CREATININE, GLUCOSE, CALCIUM in the last 72 hours. No results for input(s): LABPT, INR in the last 72 hours.  Assessment/Plan: Pt is POD1 s/p left TKA.    -Plan to discharge to home tomorrow pending patient's pain and PT eval  -WBAT with a walker  -Okay to shower, dressing is waterproof.  Cautioned patient against soaking dressing in bath/pool/body of water  -Encouraged the use of the blue cradle boot to work on extension.  Cautioned patient against using a pillow under their knee.  -Use the CPM machine at least 3 times per day for one hour each time, increasing the degrees daily.     Kristopher [name] L Lida Berkery 09/14/2019, 7:43 AM

## 2019-09-14 NOTE — Progress Notes (Signed)
Physical Therapy Treatment Patient Details Name: Kristopher PURK Sr. MRN: [id number] DOB: Oct 25, 1961 Today's Date: 09/14/2019    History of Present Illness Pt is a 58 y/o male s/p L TKA. PMH includes CKD, and HTN.     PT Comments    Patient received up in chair, very pleasant and willing to work with PT. Required MinA for sit to stand from elevated surface in chair, then tolerated progression of gait distance to 31f with RW and S, heavy cues for gait pattern, upright posture, and sequencing with RW. Returned to supine with MinA, then worked on L TKR exercises including ankle pumps, quad sets, heel slides, SLRs, SAQs, and hip abduction in supine. He was left in bed with all needs met and bed alarm active this afternoon.     Follow Up Recommendations  Follow surgeon's recommendation for DC plan and follow-up therapies;Supervision for mobility/OOB     Equipment Recommendations  Rolling walker with 5" wheels;3in1 (PT)    Recommendations for Other Services       Precautions / Restrictions Precautions Precautions: Knee Precaution Booklet Issued: No Precaution Comments: Reviewed knee precautions wtih pt.  Restrictions Weight Bearing Restrictions: Yes LLE Weight Bearing: Weight bearing as tolerated    Mobility  Bed Mobility Overal bed mobility: Needs Assistance Bed Mobility: Sit to Supine     Supine to sit: Min assist;HOB elevated     General bed mobility comments: MinA for L LE management and support, extended time  Transfers Overall transfer level: Needs assistance Equipment used: Rolling walker (2 wheeled) Transfers: Sit to/from Stand Sit to Stand: Min assist;From elevated surface         General transfer comment: Min A for lift assist and steadying. Cues for safe hand placement. Extended time  Ambulation/Gait Ambulation/Gait assistance: Supervision Gait Distance (Feet): 50 Feet Assistive device: Rolling walker (2 wheeled) Gait Pattern/deviations: Decreased  step length - right;Decreased step length - left;Decreased weight shift to left;Antalgic;Step-to pattern;Decreased stride length;Decreased dorsiflexion - left;Trunk flexed Gait velocity: Decreased   General Gait Details: cues for progressing to step through pattern and RW management, upright posture.   Stairs             Wheelchair Mobility    Modified Rankin (Stroke Patients Only)       Balance Overall balance assessment: Needs assistance Sitting-balance support: No upper extremity supported;Feet supported Sitting balance-Leahy Scale: Good     Standing balance support: Bilateral upper extremity supported;During functional activity Standing balance-Leahy Scale: Poor Standing balance comment: Reliant on UE support                             Cognition Arousal/Alertness: Awake/alert Behavior During Therapy: WFL for tasks assessed/performed Overall Cognitive Status: Within Functional Limits for tasks assessed                                        Exercises      General Comments        Pertinent Vitals/Pain Pain Assessment: 0-10 Pain Score: 6  Pain Location: L knee Pain Descriptors / Indicators: Aching;Operative site guarding Pain Intervention(s): Limited activity within patient's tolerance;Monitored during session    Home Living                      Prior Function  PT Goals (current goals can now be found in the care plan section) Acute Rehab PT Goals Patient Stated Goal: to decrease pain PT Goal Formulation: With patient Time For Goal Achievement: 09/27/19 Potential to Achieve Goals: Good Progress towards PT goals: Progressing toward goals    Frequency    7X/week      PT Plan Current plan remains appropriate    Co-evaluation              AM-PAC PT "6 Clicks" Mobility   Outcome Measure  Help needed turning from your back to your side while in a flat bed without using bedrails?: A  Little Help needed moving from lying on your back to sitting on the side of a flat bed without using bedrails?: A Little Help needed moving to and from a bed to a chair (including a wheelchair)?: A Little Help needed standing up from a chair using your arms (e.g., wheelchair or bedside chair)?: A Little Help needed to walk in hospital room?: A Little Help needed climbing 3-5 steps with a railing? : A Little 6 Click Score: 18    End of Session Equipment Utilized During Treatment: Gait belt Activity Tolerance: Patient tolerated treatment well Patient left: in bed;with call bell/phone within reach;with bed alarm set   PT Visit Diagnosis: Unsteadiness on feet (R26.81);Other abnormalities of gait and mobility (R26.89);Pain Pain - Right/Left: Left Pain - part of body: Knee     Time: 1315-1350 PT Time Calculation (min) (ACUTE ONLY): 35 min  Charges:  $Gait Training: 8-22 mins $Therapeutic Exercise: 8-22 mins                     Deniece Ree PT, DPT, CBIS  Supplemental Physical Therapist [facility]    Pager [phone] Acute Rehab Office [phone]

## 2019-09-14 NOTE — Progress Notes (Signed)
Pt tolerated bone foam for approx 20 min- that was after receiving dilauded 45 minutes prior too- replaced it with a pillow under the heel (not the knee)- which he could tolerate longer

## 2019-09-14 NOTE — Progress Notes (Signed)
Physical Therapy Treatment Patient Details Name: Kristopher HARVIE Sr. MRN: [id number] DOB: 1961/01/05 Today's Date: 09/14/2019    History of Present Illness Pt is a 58 y/o male s/p L TKA. PMH includes CKD, and HTN.     PT Comments    Patient received in bed, eager to participate with PT and having just received pain medication from RN. Able to complete bed mobility with MinA for L LE support, also MinA for transfers from elevated surface with RW and min guard for gait approximately 18f today with RW, Max cues for sequencing and safety with RW. He continues to be significantly limited by pain. Attempted to further progress gait distance today however he politely declines due to pain. Discussed ankle pumps and quad sets to be performed in chair, plan to practice HEP in further depth this afternoon. He was left up in the chair with all needs met, heels floated with foam under distal gastrocs,  and call bell within reach this morning.     Follow Up Recommendations  Follow surgeon's recommendation for DC plan and follow-up therapies;Supervision for mobility/OOB     Equipment Recommendations  Rolling walker with 5" wheels;3in1 (PT)    Recommendations for Other Services       Precautions / Restrictions Precautions Precautions: Knee Precaution Booklet Issued: No Precaution Comments: Reviewed knee precautions wtih pt.  Restrictions Weight Bearing Restrictions: Yes LLE Weight Bearing: Weight bearing as tolerated    Mobility  Bed Mobility Overal bed mobility: Needs Assistance Bed Mobility: Supine to Sit     Supine to sit: Min assist;HOB elevated     General bed mobility comments: MinA for L LE management and support, extended time  Transfers Overall transfer level: Needs assistance Equipment used: Rolling walker (2 wheeled) Transfers: Sit to/from Stand Sit to Stand: Min assist         General transfer comment: Min A for lift assist and steadying. Cues for safe hand  placement. Extended time  Ambulation/Gait Ambulation/Gait assistance: Min guard Gait Distance (Feet): 8 Feet Assistive device: Rolling walker (2 wheeled) Gait Pattern/deviations: Decreased step length - right;Decreased step length - left;Decreased weight shift to left;Antalgic;Step-to pattern;Decreased stride length;Decreased dorsiflexion - left;Trunk flexed Gait velocity: Decreased   General Gait Details: very slow, antalgic gait, Max cues for sequencing ahd improving WB L LE and step length R LE, also for RW use   Stairs             Wheelchair Mobility    Modified Rankin (Stroke Patients Only)       Balance Overall balance assessment: Needs assistance Sitting-balance support: No upper extremity supported;Feet supported Sitting balance-Leahy Scale: Good     Standing balance support: Bilateral upper extremity supported;During functional activity Standing balance-Leahy Scale: Poor Standing balance comment: Reliant on UE support                             Cognition Arousal/Alertness: Awake/alert Behavior During Therapy: WFL for tasks assessed/performed Overall Cognitive Status: Within Functional Limits for tasks assessed                                        Exercises Total Joint Exercises Goniometric ROM: L LE ROM estimated to be 12 degrees extension, 30 degrees flexion limited by pain    General Comments        Pertinent Vitals/Pain Pain  Assessment: 0-10 Pain Score: 7  Pain Location: L knee Pain Descriptors / Indicators: Aching;Operative site guarding Pain Intervention(s): Limited activity within patient's tolerance;Monitored during session;Premedicated before session    Home Living                      Prior Function            PT Goals (current goals can now be found in the care plan section) Acute Rehab PT Goals Patient Stated Goal: to decrease pain PT Goal Formulation: With patient Time For Goal  Achievement: 09/27/19 Potential to Achieve Goals: Good Progress towards PT goals: Progressing toward goals    Frequency    7X/week      PT Plan Current plan remains appropriate    Co-evaluation              AM-PAC PT "6 Clicks" Mobility   Outcome Measure  Help needed turning from your back to your side while in a flat bed without using bedrails?: A Little Help needed moving from lying on your back to sitting on the side of a flat bed without using bedrails?: A Little Help needed moving to and from a bed to a chair (including a wheelchair)?: A Little Help needed standing up from a chair using your arms (e.g., wheelchair or bedside chair)?: A Little Help needed to walk in hospital room?: A Little Help needed climbing 3-5 steps with a railing? : A Lot 6 Click Score: 17    End of Session Equipment Utilized During Treatment: Gait belt Activity Tolerance: Patient limited by pain Patient left: in chair;with call bell/phone within reach   PT Visit Diagnosis: Unsteadiness on feet (R26.81);Other abnormalities of gait and mobility (R26.89);Pain Pain - Right/Left: Left Pain - part of body: Knee     Time: 2080-2233 PT Time Calculation (min) (ACUTE ONLY): 25 min  Charges:  $Gait Training: 8-22 mins $Therapeutic Activity: 8-22 mins                     Deniece Ree PT, DPT, CBIS  Supplemental Physical Therapist [place]    Pager [phone] Acute Rehab Office [phone]

## 2019-09-14 NOTE — Anesthesia Postprocedure Evaluation (Signed)
Anesthesia Post Note  Patient: Kristopher REALI Sr.  Procedure(s) Performed: LEFT TOTAL KNEE ARTHROPLASTY (Left )     Patient location during evaluation: PACU Anesthesia Type: Regional and Spinal Level of consciousness: oriented and awake and alert Pain management: pain level controlled Vital Signs Assessment: post-procedure vital signs reviewed and stable Respiratory status: spontaneous breathing, respiratory function stable and patient connected to nasal cannula oxygen Cardiovascular status: blood pressure returned to baseline and stable Postop Assessment: no headache, no backache and no apparent nausea or vomiting Anesthetic complications: no    Last Vitals:  Vitals:   09/14/19 0330 09/14/19 0825  BP: (!) 162/98 (!) 149/100  Pulse: 67 70  Resp: 18 20  Temp: 36.4 C (!) 36.4 C  SpO2: 99% 99%    Last Pain:  Vitals:   09/14/19 0900  TempSrc:   PainSc: [place]

## 2019-09-14 NOTE — Progress Notes (Signed)
Patient doing well Has been up walking in the hall Needs to continue CPM machine about 3 to 4 hours/day Anticipate discharge home tomorrow afternoon

## 2019-09-15 ENCOUNTER — Other Ambulatory Visit: Payer: Self-pay | Admitting: Surgical

## 2019-09-15 ENCOUNTER — Telehealth: Payer: Self-pay | Admitting: Orthopedic Surgery

## 2019-09-15 DIAGNOSIS — K219 Gastro-esophageal reflux disease without esophagitis: Secondary | ICD-10-CM | POA: Diagnosis present

## 2019-09-15 DIAGNOSIS — E785 Hyperlipidemia, unspecified: Secondary | ICD-10-CM | POA: Diagnosis present

## 2019-09-15 DIAGNOSIS — M2342 Loose body in knee, left knee: Secondary | ICD-10-CM | POA: Diagnosis present

## 2019-09-15 DIAGNOSIS — Z8249 Family history of ischemic heart disease and other diseases of the circulatory system: Secondary | ICD-10-CM | POA: Diagnosis not present

## 2019-09-15 DIAGNOSIS — N183 Chronic kidney disease, stage 3 (moderate): Secondary | ICD-10-CM | POA: Diagnosis present

## 2019-09-15 DIAGNOSIS — I130 Hypertensive heart and chronic kidney disease with heart failure and stage 1 through stage 4 chronic kidney disease, or unspecified chronic kidney disease: Secondary | ICD-10-CM | POA: Diagnosis present

## 2019-09-15 DIAGNOSIS — M659 Synovitis and tenosynovitis, unspecified: Secondary | ICD-10-CM | POA: Diagnosis present

## 2019-09-15 DIAGNOSIS — M1712 Unilateral primary osteoarthritis, left knee: Secondary | ICD-10-CM | POA: Diagnosis present

## 2019-09-15 MED ORDER — DOCUSATE SODIUM 100 MG PO CAPS: 100.0000 mg | ORAL_CAPSULE | Freq: Two times a day (BID) | ORAL | 0 refills | Status: DC

## 2019-09-15 MED ORDER — HYDROCODONE-ACETAMINOPHEN 10-325 MG PO TABS: 1.0000 | ORAL_TABLET | ORAL | 0 refills | Status: DC | PRN

## 2019-09-15 MED ORDER — ASPIRIN EC 81 MG PO TBEC: 81.0000 mg | DELAYED_RELEASE_TABLET | Freq: Two times a day (BID) | ORAL | 0 refills | Status: DC

## 2019-09-15 MED ORDER — HYDROCODONE-ACETAMINOPHEN 10-325 MG PO TABS: 1.0000 | ORAL_TABLET | Freq: Four times a day (QID) | ORAL | 0 refills | Status: DC | PRN

## 2019-09-15 MED ORDER — ASPIRIN 81 MG PO CHEW: 81.0000 mg | CHEWABLE_TABLET | Freq: Two times a day (BID) | ORAL | 0 refills | Status: DC

## 2019-09-15 MED ORDER — METHOCARBAMOL 500 MG PO TABS: 500.0000 mg | ORAL_TABLET | Freq: Three times a day (TID) | ORAL | 0 refills | Status: DC | PRN

## 2019-09-15 MED ORDER — METHOCARBAMOL 500 MG PO TABS: 500.0000 mg | ORAL_TABLET | Freq: Four times a day (QID) | ORAL | 0 refills | Status: DC | PRN

## 2019-09-15 NOTE — Discharge Summary (Signed)
Physician Discharge Summary      Patient ID: Kristopher BREUNINGER Sr. MRN: [id number] DOB/AGE: [birth year] 60 y.o.  Admit date: 09/13/2019 Discharge date: 09/15/2019  Admission Diagnoses:  Active Problems:   Arthritis of left knee   Discharge Diagnoses:  Same  Surgeries: Procedure(s): LEFT TOTAL KNEE ARTHROPLASTY on 09/13/2019   Consultants:   Discharged Condition: Stable  Hospital Course: Kristopher Crissman. is an 58 y.o. male who was admitted 09/13/2019 with a chief complaint of left knee pain, and found to have a diagnosis of left knee OA.  They were brought to the operating room on 09/13/2019 and underwent the above named procedures.  Pt awoke from anesthesia without complication and was transferred to the floor. On POD1, pt was not able to go home due to pain and decreased walking/standing endurance.  On POD2, patient's pain had improved significantly and he did well in PT sessions.  He was discharged on POD2.  Pt will f/u with Dr. August Saucer in clinic in ~2 weeks.   Antibiotics given:  Anti-infectives (From admission, onward)   Start     Dose/Rate Route Frequency Ordered Stop   09/13/19 1800  ceFAZolin (ANCEF) IVPB 2g/100 mL premix     2 g 200 mL/hr over 30 Minutes Intravenous Every 6 hours 09/13/19 1608 09/13/19 2346   09/13/19 0600  ceFAZolin (ANCEF) 3 g in dextrose 5 % 50 mL IVPB     3 g 100 mL/hr over 30 Minutes Intravenous On call to O.R. 09/12/19 0277 09/13/19 1257    .  Recent vital signs:  Vitals:   09/15/19 0338 09/15/19 0755  BP: (!) 133/100 (!) 144/97  Pulse: (!) 102 (!) 103  Resp:    Temp: (!) 97.5 F (36.4 C) 99.3 F (37.4 C)  SpO2: 97% 97%    Recent laboratory studies:  Results for orders placed or performed during the hospital encounter of 09/13/19  Urine culture   Specimen: Urine, Clean Catch  Result Value Ref Range   Specimen Description URINE, CLEAN CATCH    Special Requests NONE    Culture      NO GROWTH Performed at [facility],  [address], [place], [place] [address]    Report Status 09/14/2019 FINAL   Urinalysis, Routine w reflex microscopic  Result Value Ref Range   Color, Urine YELLOW YELLOW   APPearance CLEAR CLEAR   Specific Gravity, Urine 1.017 1.005 - 1.030   pH 5.0 5.0 - 8.0   Glucose, UA NEGATIVE NEGATIVE mg/dL   Hgb urine dipstick NEGATIVE NEGATIVE   Bilirubin Urine NEGATIVE NEGATIVE   Ketones, ur NEGATIVE NEGATIVE mg/dL   Protein, ur NEGATIVE NEGATIVE mg/dL   Nitrite NEGATIVE NEGATIVE   Leukocytes,Ua NEGATIVE NEGATIVE    Discharge Medications:   Allergies as of 09/15/2019   No Known Allergies     Medication List    STOP taking these medications   acetaminophen 650 MG CR tablet Commonly known as: TYLENOL   hydrALAZINE 25 MG tablet Commonly known as: APRESOLINE   predniSONE 10 MG tablet Commonly known as: DELTASONE     TAKE these medications   allopurinol 100 MG tablet Commonly known as: ZYLOPRIM Take 100 mg by mouth daily.   aspirin 81 MG chewable tablet Chew 1 tablet (81 mg total) by mouth 2 (two) times daily.   atorvastatin 10 MG tablet Commonly known as: LIPITOR Take 10 mg by mouth daily.   Bystolic 20 MG Tabs Generic drug: Nebivolol HCl TAKE 1 TABLET  BY MOUTH EVERY DAY What changed: how much to take   chlorthalidone 50 MG tablet Commonly known as: HYGROTON Take 50 mg by mouth daily. What changed: Another medication with the same name was removed. Continue taking this medication, and follow the directions you see here.   docusate sodium 100 MG capsule Commonly known as: COLACE Take 1 capsule (100 mg total) by mouth 2 (two) times daily.   HYDROcodone-acetaminophen 10-325 MG tablet Commonly known as: Norco Take 1 tablet by mouth every 4 (four) hours as needed.   losartan 25 MG tablet Commonly known as: COZAAR TAKE 1 TABLET BY MOUTH EVERY DAY   methocarbamol 500 MG tablet Commonly known as: Robaxin Take 1 tablet (500 mg total) by mouth every 8 (eight) hours as  needed for muscle spasms.   naproxen sodium 220 MG tablet Commonly known as: ALEVE Take 440-880 mg by mouth as needed (knee swelling).   potassium chloride 10 MEQ CR capsule Commonly known as: MICRO-K Take 10 mEq by mouth daily.   spironolactone 50 MG tablet Commonly known as: ALDACTONE TAKE 1 TABLET BY MOUTH EVERY DAY            Durable Medical Equipment  (From admission, onward)         Start     Ordered   09/15/19 1023  For home use only DME Walker  Once    Question:  Patient needs a walker to treat with the following condition  Answer:  Knee joint replacement status, left   09/15/19 1025   09/14/19 1738  For home use only DME 3 n 1  Once     09/14/19 1737   09/14/19 1737  For home use only DME Eelevated commode seat  Once     09/14/19 1737          Diagnostic Studies: No results found.  Disposition: Discharge disposition: 01-Home or Self Care       Discharge Instructions    Call MD / Call 911   Complete by: As directed    If you experience chest pain or shortness of breath, CALL 911 and be transported to the hospital emergency room.  If you develope a fever above 101 F, pus (white drainage) or increased drainage or redness at the wound, or calf pain, call your surgeon's office.   Constipation Prevention   Complete by: As directed    Drink plenty of fluids.  Prune juice may be helpful.  You may use a stool softener, such as Colace (over the counter) 100 mg twice a day.  Use MiraLax (over the counter) for constipation as needed.   Diet - low sodium heart healthy   Complete by: As directed    Discharge instructions   Complete by: As directed    Weightbearing as tolerated with walker CPM machine 1 hour 3 times a day increasing the degrees daily Okay to shower dressing is waterproof   Face-to-face encounter (required for Medicare/Medicaid patients)   Complete by: As directed    I Anderson Malta certify that this patient is under my care and that I, or a  nurse practitioner or physician's assistant working with me, had a face-to-face encounter that meets the physician face-to-face encounter requirements with this patient on 09/15/2019. The encounter with the patient was in whole, or in part for the following medical condition(s) which is the primary reason for home health care (List medical condition): Total knee replacement.  He will do well with home health physical therapy  twice a week for 2 weeks until he starts outpatient therapy   The encounter with the patient was in whole, or in part, for the following medical condition, which is the primary reason for home health care: Total knee replacement   I certify that, based on my findings, the following services are medically necessary home health services: Physical therapy   Reason for Medically Necessary Home Health Services: Therapy- Therapeutic Exercises to Increase Strength and Endurance   My clinical findings support the need for the above services: Pain interferes with ambulation/mobility   Further, I certify that my clinical findings support that this patient is homebound due to: Pain interferes with ambulation/mobility   Home Health   Complete by: As directed    To provide the following care/treatments: PT   Increase activity slowly as tolerated   Complete by: As directed          Signed: Julieanne Cottonharles L Davonda Ausley 09/15/2019, 2:29 PM

## 2019-09-15 NOTE — Progress Notes (Addendum)
Pt is anxious to go home, he asked and called to the secretary and RN 4x about his discharge. Charles PA paged. Message sent to Dr Marlou Sa. [phone] Discharge instructions given to pt, verbalized understanding. 3 in 1 and a walker was delivered in the room and taken by pt. Discharged to home.

## 2019-09-15 NOTE — Telephone Encounter (Signed)
See note from Dr Dean.  

## 2019-09-15 NOTE — Telephone Encounter (Signed)
Patient called advised he was suppose to be discharged from the hospital today. The number to contact patient is [phone]

## 2019-09-15 NOTE — Telephone Encounter (Signed)
Please send this to [facility] I am not sure why he is not discharged already I had to do  all of his discharge stuff I do not really understand what is going on with that whole deal

## 2019-09-15 NOTE — Progress Notes (Signed)
  Subjective: Kristopher WAILES Sr. is a 58 y.o. male s/p Left TKA.  They are POD2.  Pt's pain is controlled. Pt has ambulated with little difficulty.     Objective: Vital signs in last 24 hours: Temp:  [97.5 F (36.4 C)-99.3 F (37.4 C)] 99.3 F (37.4 C) (09/17 0755) Pulse Rate:  [77-103] 103 (09/17 0755) Resp:  [18-19] 18 (09/16 1945) BP: (133-160)/(82-100) 144/97 (09/17 0755) SpO2:  [97 %-100 %] 97 % (09/17 0755)  Intake/Output from previous day: 09/16 0701 - 09/17 0700 In: 720 [P.O.:720] Out: 1250 [Urine:1250] Intake/Output this shift: Total I/O In: 480 [P.O.:480] Out: 900 [Urine:900]  Exam:  No gross blood or drainage overlying the dressing 2+ DP pulse Sensation intact distally in the left foot Able to dorsiflex and plantarflex the left foot   Labs: No results for input(s): HGB in the last 72 hours. No results for input(s): WBC, RBC, HCT, PLT in the last 72 hours. No results for input(s): NA, K, CL, CO2, BUN, CREATININE, GLUCOSE, CALCIUM in the last 72 hours. No results for input(s): LABPT, INR in the last 72 hours.  Assessment/Plan: Pt is POD2 s/p left TKA.    -Plan to discharge to home today pending patient's pain and PT eval  -WBAT with a walker  -Okay to shower, dressing is waterproof.  Cautioned patient against soaking dressing in bath/pool/body of water  -Encouraged the use of the blue cradle boot to work on extension.  Cautioned patient against using a pillow under their knee.  -Use the CPM machine at least 3 times per day for one hour each time, increasing the degrees daily.     Shawnita Krizek L Eder Macek 09/15/2019, 2:28 PM

## 2019-09-15 NOTE — Telephone Encounter (Signed)
Please advise. Thanks.  

## 2019-09-15 NOTE — Progress Notes (Signed)
Physical Therapy Treatment Patient Details Name: Kristopher LORIA Sr. MRN: [id number] DOB: 03-25-1961 Today's Date: 09/15/2019    History of Present Illness Pt is a 58 y/o male s/p L TKA. PMH includes CKD, and HTN.     PT Comments    Patient received in bed, pleasant and willing to work with therapy. Continues to be able to perform functional transfers with min guard, able to progress gait distance to 80f with RW but continues to require cues for upright posture and sequencing. Reviewed seated exercises for TKR which patient was able to perform without difficulty. He reports no further concerns or questions for PT, eager to return home at this point. He was left up in the chair with all needs met and call bell in reach this afternoon.     Follow Up Recommendations  Follow surgeon's recommendation for DC plan and follow-up therapies;Supervision for mobility/OOB     Equipment Recommendations  Rolling walker with 5" wheels;3in1 (PT)    Recommendations for Other Services       Precautions / Restrictions Precautions Precautions: Knee Precaution Booklet Issued: No Precaution Comments: Reviewed knee precautions wtih pt.  Restrictions Weight Bearing Restrictions: Yes LLE Weight Bearing: Weight bearing as tolerated    Mobility  Bed Mobility Overal bed mobility: Needs Assistance Bed Mobility: Supine to Sit     Supine to sit: Min assist;Min guard     General bed mobility comments: mostly min guard, just need a boost from MinA to clear edge of mattress  Transfers Overall transfer level: Needs assistance Equipment used: Rolling walker (2 wheeled) Transfers: Sit to/from Stand Sit to Stand: Min guard         General transfer comment: Min guard, extended time, cues for hand placement and sequencing, control of stand to sit  Ambulation/Gait Ambulation/Gait assistance: Supervision Gait Distance (Feet): 90 Feet Assistive device: Rolling walker (2 wheeled) Gait  Pattern/deviations: Decreased step length - right;Decreased step length - left;Decreased weight shift to left;Antalgic;Step-to pattern;Decreased stride length;Decreased dorsiflexion - left;Trunk flexed Gait velocity: Decreased   General Gait Details: continued need for cues for improved step length, heel toe pattern, upright posture and sequencing with RW    Wheelchair Mobility    Modified Rankin (Stroke Patients Only)       Balance Overall balance assessment: Needs assistance Sitting-balance support: No upper extremity supported;Feet supported Sitting balance-Leahy Scale: Good     Standing balance support: Bilateral upper extremity supported;During functional activity Standing balance-Leahy Scale: Fair Standing balance comment: Reliant on UE support                             Cognition Arousal/Alertness: Awake/alert Behavior During Therapy: WFL for tasks assessed/performed Overall Cognitive Status: Within Functional Limits for tasks assessed                                        Exercises Total Joint Exercises Long Arc Quad: Left;5 reps;Seated Knee Flexion: Left;Seated;10 reps Goniometric ROM: L LE ROM estimated to be 15 degrees extension, 45 degrees flexion limited by pain    General Comments        Pertinent Vitals/Pain Pain Assessment: 0-10 Pain Score: 5  Pain Location: L knee Pain Descriptors / Indicators: Aching;Operative site guarding Pain Intervention(s): Limited activity within patient's tolerance;Monitored during session    Home Living  Prior Function            PT Goals (current goals can now be found in the care plan section) Acute Rehab PT Goals Patient Stated Goal: to decrease pain PT Goal Formulation: With patient Time For Goal Achievement: 09/27/19 Potential to Achieve Goals: Good Progress towards PT goals: Progressing toward goals    Frequency    7X/week      PT Plan  Current plan remains appropriate    Co-evaluation              AM-PAC PT "6 Clicks" Mobility   Outcome Measure  Help needed turning from your back to your side while in a flat bed without using bedrails?: A Little Help needed moving from lying on your back to sitting on the side of a flat bed without using bedrails?: A Little Help needed moving to and from a bed to a chair (including a wheelchair)?: A Little Help needed standing up from a chair using your arms (e.g., wheelchair or bedside chair)?: A Little Help needed to walk in hospital room?: A Little Help needed climbing 3-5 steps with a railing? : A Little 6 Click Score: 18    End of Session Equipment Utilized During Treatment: Gait belt Activity Tolerance: Patient tolerated treatment well Patient left: in chair;with call bell/phone within reach   PT Visit Diagnosis: Unsteadiness on feet (R26.81);Other abnormalities of gait and mobility (R26.89);Pain Pain - Right/Left: Left Pain - part of body: Knee     Time: 5366-4403 PT Time Calculation (min) (ACUTE ONLY): 27 min  Charges:  $Gait Training: 8-22 mins $Therapeutic Exercise: 8-22 mins                     Deniece Ree PT, DPT, CBIS  Supplemental Physical Therapist [place]    Pager [phone] Acute Rehab Office [phone]

## 2019-09-15 NOTE — TOC Initial Note (Signed)
Transition of Care (TOC) - Initial/Assessment Note    Patient Details  Name: Kristopher GASSMAN Sr. MRN: [id number] Date of Birth: 1961-07-13  Transition of Care [facility]) CM/SW Contact:    Weston Anna, LCSW Phone Number: 09/15/2019, 12:09 PM  Clinical Narrative:                  Patient active with [facility]- will notify representative once patient is set to discharge.   DME orders completed by MD for 3n1 and walker- ordered from [employer].   No other needs at this time.    Expected Discharge Plan: [place] Barriers to Discharge: Continued Medical Work up   Patient Goals and CMS Choice        Expected Discharge Plan and Services Expected Discharge Plan: [place] In-house Referral: Clinical Social Work   Post Acute Care Choice: Museum/gallery conservator, Home Health                   DME Arranged: Walker rolling, 3-N-1 DME Agency: [employer] Date DME Agency Contacted: 09/15/19 Time DME Agency Contacted: 1208 Representative spoke with at DME Agency: [place]: PT, OT [place] Agency: [facility] [employer] [facility] [employer]     Representative spoke with at [place]: [place] Arrangements/Services   Lives with:: Self   Do you feel safe going back to the place where you live?: Yes               Activities of Daily Living Home Assistive Devices/Equipment: Cane (specify quad or straight) ADL Screening (condition at time of admission) Patient's cognitive ability adequate to safely complete daily activities?: Yes Is the patient deaf or have difficulty hearing?: No Does the patient have difficulty seeing, even when wearing glasses/contacts?: No Does the patient have difficulty concentrating, remembering, or making decisions?: No Patient able to express need for assistance with ADLs?: Yes Does the patient have difficulty dressing or bathing?: No Independently performs ADLs?: Yes (appropriate for  developmental age) Does the patient have difficulty walking or climbing stairs?: Yes Weakness of Legs: Left Weakness of Arms/Hands: None  Permission Sought/Granted                  Emotional Assessment              Admission diagnosis:  Arthritis of left knee [M17.12] Patient Active Problem List   Diagnosis Date Noted  . Arthritis of left knee 09/13/2019  . Pain and swelling of ankle, right 12/09/2017  . Hyperlipidemia with target LDL less than 100 06/18/2017  . Hypertensive heart disease without heart failure 06/18/2017  . Bilateral renal cysts 06/18/2017  . Resistant hypertension 05/18/2017  . H/O hypokalemia 05/18/2017  . Framingham cardiac risk 10-20% in next 10 years 03/17/2017  . Diverticulosis of large intestine without hemorrhage 09/09/2016  . Benign essential hypertension 07/15/2016  . Acute bilateral ankle pain 09/04/2015  . Closed nondisplaced fracture of navicular bone of left foot 09/04/2015  . Pain in both feet 09/04/2015  . Primary osteoarthritis of both knees 05/30/2015  . Arthralgia of both knees 01/05/2015  . Arthralgia of left hip 01/05/2015  . Abnormal EKG 06/18/2014  . Knee pain 03/28/2014   PCP:  Virginia Rochester, PA Pharmacy:   [id number] - [place], [place] [address] [place] [address] Phone: [phone] Fax: [phone]  Social Determinants of Health (SDOH) Interventions    Readmission Risk Interventions No flowsheet data found.

## 2019-09-15 NOTE — Progress Notes (Signed)
Physical Therapy Treatment Patient Details Name: Kristopher SHEAN Sr. MRN: [id number] DOB: May 26, 1961 Today's Date: 09/15/2019    History of Present Illness Pt is a 58 y/o male s/p L TKA. PMH includes CKD, and HTN.     PT Comments    Patient received up in chair, pleasant and willing to work with PT services today. Able to complete functional transfers with min guard and extended time, cues for hand placement and control of motion. Tolerated gait training approximately 70f with RW today however does require cues for sequencing, improved step length, upright posture, and heel-toe pattern. Introduced sIT trainer attempted forwards ascent initially but patient very pain limited and unsafe with this approach- changed to backwards ascent/forwards descent with RW and patient able to perform with RW and min guard. He was left up in the recliner, encouraged to have leg rest elevated to assist in achieving knee extension and controlling edema (had positioned him with LEs elevated but he continues to put leg rest down with PT leaves room), all needs otherwise met this morning.     Follow Up Recommendations  Follow surgeon's recommendation for DC plan and follow-up therapies;Supervision for mobility/OOB     Equipment Recommendations  Rolling walker with 5" wheels;3in1 (PT)    Recommendations for Other Services       Precautions / Restrictions Precautions Precautions: Knee Precaution Booklet Issued: No Restrictions Weight Bearing Restrictions: Yes LLE Weight Bearing: Weight bearing as tolerated    Mobility  Bed Mobility               General bed mobility comments: OOB in chair  Transfers Overall transfer level: Needs assistance Equipment used: Rolling walker (2 wheeled) Transfers: Sit to/from Stand Sit to Stand: Min guard         General transfer comment: Min guard, extended time, cues for hand placement and sequencing, control of stand to  sit  Ambulation/Gait Ambulation/Gait assistance: Supervision Gait Distance (Feet): 75 Feet Assistive device: Rolling walker (2 wheeled) Gait Pattern/deviations: Decreased step length - right;Decreased step length - left;Decreased weight shift to left;Antalgic;Step-to pattern;Decreased stride length;Decreased dorsiflexion - left;Trunk flexed Gait velocity: Decreased   General Gait Details: continued need for cues for improved step length, heel toe pattern, upright posture and sequencing with RW   Stairs Stairs: Yes Stairs assistance: Min guard Stair Management: Backwards;Forwards;With walker Number of Stairs: 1 General stair comments: backwards ascent (unable to tolerate forwards ascent due to L knee pain)/forwards descent with min guard and RW   Wheelchair Mobility    Modified Rankin (Stroke Patients Only)       Balance Overall balance assessment: Needs assistance Sitting-balance support: No upper extremity supported;Feet supported Sitting balance-Leahy Scale: Good     Standing balance support: Bilateral upper extremity supported;During functional activity Standing balance-Leahy Scale: Fair Standing balance comment: Reliant on UE support                             Cognition Arousal/Alertness: Awake/alert Behavior During Therapy: WFL for tasks assessed/performed Overall Cognitive Status: Within Functional Limits for tasks assessed                                        Exercises Total Joint Exercises Goniometric ROM: L LE ROM estimated to be 15 degrees extension, 45 degrees flexion limited by pain    General Comments  Pertinent Vitals/Pain Pain Assessment: 0-10 Pain Score: 5  Pain Location: L knee Pain Descriptors / Indicators: Aching;Operative site guarding Pain Intervention(s): Limited activity within patient's tolerance;Monitored during session    Home Living                      Prior Function             PT Goals (current goals can now be found in the care plan section) Acute Rehab PT Goals Patient Stated Goal: to decrease pain PT Goal Formulation: With patient Time For Goal Achievement: 09/27/19 Potential to Achieve Goals: Good Progress towards PT goals: Progressing toward goals    Frequency    7X/week      PT Plan Current plan remains appropriate    Co-evaluation              AM-PAC PT "6 Clicks" Mobility   Outcome Measure  Help needed turning from your back to your side while in a flat bed without using bedrails?: A Little Help needed moving from lying on your back to sitting on the side of a flat bed without using bedrails?: A Little Help needed moving to and from a bed to a chair (including a wheelchair)?: A Little Help needed standing up from a chair using your arms (e.g., wheelchair or bedside chair)?: A Little Help needed to walk in hospital room?: A Little Help needed climbing 3-5 steps with a railing? : A Little 6 Click Score: 18    End of Session Equipment Utilized During Treatment: Gait belt Activity Tolerance: Patient tolerated treatment well Patient left: in chair;with call bell/phone within reach   PT Visit Diagnosis: Unsteadiness on feet (R26.81);Other abnormalities of gait and mobility (R26.89);Pain Pain - Right/Left: Left Pain - part of body: Knee     Time: 7737-3668 PT Time Calculation (min) (ACUTE ONLY): 30 min  Charges:  $Gait Training: 23-37 mins                     Deniece Ree PT, DPT, CBIS  Supplemental Physical Therapist [place]    Pager [phone] Acute Rehab Office [phone]

## 2019-09-16 DIAGNOSIS — Z471 Aftercare following joint replacement surgery: Secondary | ICD-10-CM | POA: Diagnosis not present

## 2019-09-16 DIAGNOSIS — I129 Hypertensive chronic kidney disease with stage 1 through stage 4 chronic kidney disease, or unspecified chronic kidney disease: Secondary | ICD-10-CM | POA: Diagnosis not present

## 2019-09-16 DIAGNOSIS — E785 Hyperlipidemia, unspecified: Secondary | ICD-10-CM | POA: Diagnosis not present

## 2019-09-16 DIAGNOSIS — K219 Gastro-esophageal reflux disease without esophagitis: Secondary | ICD-10-CM | POA: Diagnosis not present

## 2019-09-16 DIAGNOSIS — Z96652 Presence of left artificial knee joint: Secondary | ICD-10-CM | POA: Diagnosis not present

## 2019-09-16 DIAGNOSIS — N183 Chronic kidney disease, stage 3 (moderate): Secondary | ICD-10-CM | POA: Diagnosis not present

## 2019-09-16 DIAGNOSIS — M1711 Unilateral primary osteoarthritis, right knee: Secondary | ICD-10-CM | POA: Diagnosis not present

## 2019-09-19 ENCOUNTER — Telehealth: Payer: Self-pay | Admitting: Orthopedic Surgery

## 2019-09-19 DIAGNOSIS — E785 Hyperlipidemia, unspecified: Secondary | ICD-10-CM | POA: Diagnosis not present

## 2019-09-19 DIAGNOSIS — N183 Chronic kidney disease, stage 3 (moderate): Secondary | ICD-10-CM | POA: Diagnosis not present

## 2019-09-19 DIAGNOSIS — M1711 Unilateral primary osteoarthritis, right knee: Secondary | ICD-10-CM | POA: Diagnosis not present

## 2019-09-19 DIAGNOSIS — Z471 Aftercare following joint replacement surgery: Secondary | ICD-10-CM | POA: Diagnosis not present

## 2019-09-19 DIAGNOSIS — Z96652 Presence of left artificial knee joint: Secondary | ICD-10-CM | POA: Diagnosis not present

## 2019-09-19 DIAGNOSIS — K219 Gastro-esophageal reflux disease without esophagitis: Secondary | ICD-10-CM | POA: Diagnosis not present

## 2019-09-19 DIAGNOSIS — I129 Hypertensive chronic kidney disease with stage 1 through stage 4 chronic kidney disease, or unspecified chronic kidney disease: Secondary | ICD-10-CM | POA: Diagnosis not present

## 2019-09-19 NOTE — Telephone Encounter (Signed)
IC s/w patient and advised per Dr Dean.  

## 2019-09-19 NOTE — Telephone Encounter (Signed)
Please advise. Thanks.  

## 2019-09-19 NOTE — Telephone Encounter (Signed)
He should use what we prescribed for his left knee pain for his right leg pain.

## 2019-09-19 NOTE — Telephone Encounter (Signed)
Patient called stating that his right leg is now hurting him as well.  Patient also stated that his [facility] PT has only been to his house one time and does not have any appointment for [facility] scheduled.  He wants to know if something can be prescribed to him for his right leg pain and swelling.  CB#[phone].  Thank you.

## 2019-09-20 ENCOUNTER — Telehealth: Payer: Self-pay

## 2019-09-20 NOTE — Telephone Encounter (Signed)
I spoke with patient. He wanted me to let you know his non-surgical knee is doing better today. He is able to get around a little more. He said that he has a history of gout and feels like he may have had a gout attack. He said that he had an old prescription of prednisone that he started taking last night and is feeling better.

## 2019-09-20 NOTE — Telephone Encounter (Signed)
Okay; thanks.

## 2019-09-21 DIAGNOSIS — K219 Gastro-esophageal reflux disease without esophagitis: Secondary | ICD-10-CM | POA: Diagnosis not present

## 2019-09-21 DIAGNOSIS — N183 Chronic kidney disease, stage 3 (moderate): Secondary | ICD-10-CM | POA: Diagnosis not present

## 2019-09-21 DIAGNOSIS — Z96652 Presence of left artificial knee joint: Secondary | ICD-10-CM | POA: Diagnosis not present

## 2019-09-21 DIAGNOSIS — E785 Hyperlipidemia, unspecified: Secondary | ICD-10-CM | POA: Diagnosis not present

## 2019-09-21 DIAGNOSIS — M1711 Unilateral primary osteoarthritis, right knee: Secondary | ICD-10-CM | POA: Diagnosis not present

## 2019-09-21 DIAGNOSIS — Z471 Aftercare following joint replacement surgery: Secondary | ICD-10-CM | POA: Diagnosis not present

## 2019-09-21 DIAGNOSIS — I129 Hypertensive chronic kidney disease with stage 1 through stage 4 chronic kidney disease, or unspecified chronic kidney disease: Secondary | ICD-10-CM | POA: Diagnosis not present

## 2019-09-23 ENCOUNTER — Telehealth: Payer: Self-pay | Admitting: Orthopedic Surgery

## 2019-09-23 ENCOUNTER — Other Ambulatory Visit: Payer: Self-pay | Admitting: Surgical

## 2019-09-23 DIAGNOSIS — N183 Chronic kidney disease, stage 3 (moderate): Secondary | ICD-10-CM | POA: Diagnosis not present

## 2019-09-23 DIAGNOSIS — M1711 Unilateral primary osteoarthritis, right knee: Secondary | ICD-10-CM | POA: Diagnosis not present

## 2019-09-23 DIAGNOSIS — Z471 Aftercare following joint replacement surgery: Secondary | ICD-10-CM | POA: Diagnosis not present

## 2019-09-23 DIAGNOSIS — K219 Gastro-esophageal reflux disease without esophagitis: Secondary | ICD-10-CM | POA: Diagnosis not present

## 2019-09-23 DIAGNOSIS — Z96652 Presence of left artificial knee joint: Secondary | ICD-10-CM | POA: Diagnosis not present

## 2019-09-23 DIAGNOSIS — E785 Hyperlipidemia, unspecified: Secondary | ICD-10-CM | POA: Diagnosis not present

## 2019-09-23 DIAGNOSIS — I129 Hypertensive chronic kidney disease with stage 1 through stage 4 chronic kidney disease, or unspecified chronic kidney disease: Secondary | ICD-10-CM | POA: Diagnosis not present

## 2019-09-23 MED ORDER — HYDROCODONE-ACETAMINOPHEN 10-325 MG PO TABS: 1.0000 | ORAL_TABLET | Freq: Four times a day (QID) | ORAL | 0 refills | Status: DC | PRN

## 2019-09-23 NOTE — Telephone Encounter (Signed)
Please advise. Thanks.  

## 2019-09-23 NOTE — Telephone Encounter (Signed)
Patient called. Says he is almost out of pain meds. Would like some called in to his pharmacy. His call back number is [phone]

## 2019-09-26 ENCOUNTER — Telehealth: Payer: Self-pay | Admitting: Orthopedic Surgery

## 2019-09-26 DIAGNOSIS — Z471 Aftercare following joint replacement surgery: Secondary | ICD-10-CM | POA: Diagnosis not present

## 2019-09-26 DIAGNOSIS — M1711 Unilateral primary osteoarthritis, right knee: Secondary | ICD-10-CM | POA: Diagnosis not present

## 2019-09-26 DIAGNOSIS — Z96652 Presence of left artificial knee joint: Secondary | ICD-10-CM | POA: Diagnosis not present

## 2019-09-26 DIAGNOSIS — I129 Hypertensive chronic kidney disease with stage 1 through stage 4 chronic kidney disease, or unspecified chronic kidney disease: Secondary | ICD-10-CM | POA: Diagnosis not present

## 2019-09-26 DIAGNOSIS — K219 Gastro-esophageal reflux disease without esophagitis: Secondary | ICD-10-CM | POA: Diagnosis not present

## 2019-09-26 DIAGNOSIS — E785 Hyperlipidemia, unspecified: Secondary | ICD-10-CM | POA: Diagnosis not present

## 2019-09-26 DIAGNOSIS — N183 Chronic kidney disease, stage 3 (moderate): Secondary | ICD-10-CM | POA: Diagnosis not present

## 2019-09-26 NOTE — Telephone Encounter (Signed)
Patient calling wanting to know if he can get a script for a shower chair. This would need to be ordered through the [place].

## 2019-09-27 NOTE — Telephone Encounter (Signed)
faxed

## 2019-09-28 ENCOUNTER — Ambulatory Visit: Payer: Self-pay

## 2019-09-28 ENCOUNTER — Encounter: Payer: Self-pay | Admitting: Orthopedic Surgery

## 2019-09-28 ENCOUNTER — Ambulatory Visit (INDEPENDENT_AMBULATORY_CARE_PROVIDER_SITE_OTHER): Payer: No Typology Code available for payment source | Admitting: Orthopedic Surgery

## 2019-09-28 DIAGNOSIS — E785 Hyperlipidemia, unspecified: Secondary | ICD-10-CM | POA: Diagnosis not present

## 2019-09-28 DIAGNOSIS — N183 Chronic kidney disease, stage 3 (moderate): Secondary | ICD-10-CM | POA: Diagnosis not present

## 2019-09-28 DIAGNOSIS — M1711 Unilateral primary osteoarthritis, right knee: Secondary | ICD-10-CM | POA: Diagnosis not present

## 2019-09-28 DIAGNOSIS — I129 Hypertensive chronic kidney disease with stage 1 through stage 4 chronic kidney disease, or unspecified chronic kidney disease: Secondary | ICD-10-CM | POA: Diagnosis not present

## 2019-09-28 DIAGNOSIS — Z96652 Presence of left artificial knee joint: Secondary | ICD-10-CM

## 2019-09-28 DIAGNOSIS — Z471 Aftercare following joint replacement surgery: Secondary | ICD-10-CM | POA: Diagnosis not present

## 2019-09-28 DIAGNOSIS — K219 Gastro-esophageal reflux disease without esophagitis: Secondary | ICD-10-CM | POA: Diagnosis not present

## 2019-09-28 NOTE — Progress Notes (Signed)
Post-Op Visit Note   Patient: Kristopher GRILL Sr.           Date of Birth: March 30, 1961           MRN: [id number] Visit Date: 09/28/2019 PCP: Virginia Rochester, PA   Assessment & Plan:  Chief Complaint:  Chief Complaint  Patient presents with  . Left Knee - Pain   Visit Diagnoses:  1. Status post total left knee replacement     Plan: Evette Doffing is now about 2 weeks out left total knee replacement.  On exam he has good alignment.  Lacking about 10 degrees of full extension but has about an 18 degree flexion contracture on the right-hand side.  He bends it pretty easily to 90 degrees.  Mild effusion is present.  Incision intact.  Radiographs look reasonable.  Plan at this time is to continue with home health physical therapy for 2 more weeks.  I think we can transition him to outpatient therapy versus plant therapy at that time.  He knows someone there at the plant that can help him with his range of motion.  I will see him back in 2 weeks and we will get him into some outpatient PT at that time.  No calf tenderness today on exam.  Does have little bit of swelling going distally into that left anterior lateral compartment proximally but his ankle dorsiflexion strength is excellent.  Negative Homans.  Follow-Up Instructions: Return in about 2 weeks (around 10/12/2019).   Orders:  Orders Placed This Encounter  Procedures  . XR Knee 1-2 Views Left   No orders of the defined types were placed in this encounter.   Imaging: Xr Knee 1-2 Views Left  Result Date: 09/28/2019 AP lateral left knee reviewed.  Total knee prosthesis in good position alignment with no complicating features.   PMFS History: Patient Active Problem List   Diagnosis Date Noted  . Arthritis of left knee 09/13/2019  . Pain and swelling of ankle, right 12/09/2017  . Hyperlipidemia with target LDL less than 100 06/18/2017  . Hypertensive heart disease without heart failure 06/18/2017  . Bilateral renal cysts  06/18/2017  . Resistant hypertension 05/18/2017  . H/O hypokalemia 05/18/2017  . Framingham cardiac risk 10-20% in next 10 years 03/17/2017  . Diverticulosis of large intestine without hemorrhage 09/09/2016  . Benign essential hypertension 07/15/2016  . Acute bilateral ankle pain 09/04/2015  . Closed nondisplaced fracture of navicular bone of left foot 09/04/2015  . Pain in both feet 09/04/2015  . Primary osteoarthritis of both knees 05/30/2015  . Arthralgia of both knees 01/05/2015  . Arthralgia of left hip 01/05/2015  . Abnormal EKG 06/18/2014  . Knee pain 03/28/2014   Past Medical History:  Diagnosis Date  . Ambulates with cane   . Arthritis   . CKD (chronic kidney disease)    stage III (nephrologist: Donato Heinz, MD) 03/2019  . GERD (gastroesophageal reflux disease)   . Hyperlipidemia   . Hypertension   . Seasonal allergies     Family History  Problem Relation Age of Onset  . Cancer Mother   . Cancer Father   . Cancer Sister   . Hypertension Sister   . Cancer Brother   . Hypertension Brother   . Cancer Sister   . Hypertension Sister     Past Surgical History:  Procedure Laterality Date  . COLONOSCOPY    . HAND SURGERY Left 1983  . KNEE ARTHROSCOPY Left 1986  . RENAL ARTERY DOPPLERS  05/29/2017    Normal Renal A Bilaterally.  L > R kidney (3.2 cm) . Renal Cysts noted  - larges ~3.7 cm x 3.5 cm (R kidney), L kidney - ~1.7 cm x 1.6 cm -> recommend referral to Nephrology  . TOTAL KNEE ARTHROPLASTY Left 09/13/2019  . TOTAL KNEE ARTHROPLASTY Left 09/13/2019   Procedure: LEFT TOTAL KNEE ARTHROPLASTY;  Surgeon: Cammy Copa, MD;  Location: [facility] OR;  Service: Orthopedics;  Laterality: Left;  . TRANSTHORACIC ECHOCARDIOGRAM  05/2017   Normal Echo. Mod Conc LVH. EF 55-60%.  No RWMA. Normal Valves. Gr 1 DD.  Marland Kitchen UPPER GI ENDOSCOPY     Social History   Occupational History  . Not on file  Tobacco Use  . Smoking status: Never Smoker  . Smokeless tobacco: Never  Used  Substance and Sexual Activity  . Alcohol use: Yes    Comment: occasional beer/wine  . Drug use: No  . Sexual activity: Not on file

## 2019-09-30 DIAGNOSIS — Z96652 Presence of left artificial knee joint: Secondary | ICD-10-CM | POA: Diagnosis not present

## 2019-09-30 DIAGNOSIS — I129 Hypertensive chronic kidney disease with stage 1 through stage 4 chronic kidney disease, or unspecified chronic kidney disease: Secondary | ICD-10-CM | POA: Diagnosis not present

## 2019-09-30 DIAGNOSIS — E785 Hyperlipidemia, unspecified: Secondary | ICD-10-CM | POA: Diagnosis not present

## 2019-09-30 DIAGNOSIS — K219 Gastro-esophageal reflux disease without esophagitis: Secondary | ICD-10-CM | POA: Diagnosis not present

## 2019-09-30 DIAGNOSIS — M1711 Unilateral primary osteoarthritis, right knee: Secondary | ICD-10-CM | POA: Diagnosis not present

## 2019-09-30 DIAGNOSIS — N183 Chronic kidney disease, stage 3 unspecified: Secondary | ICD-10-CM | POA: Diagnosis not present

## 2019-09-30 DIAGNOSIS — Z471 Aftercare following joint replacement surgery: Secondary | ICD-10-CM | POA: Diagnosis not present

## 2019-10-03 ENCOUNTER — Telehealth: Payer: Self-pay | Admitting: Orthopedic Surgery

## 2019-10-03 DIAGNOSIS — K219 Gastro-esophageal reflux disease without esophagitis: Secondary | ICD-10-CM | POA: Diagnosis not present

## 2019-10-03 DIAGNOSIS — I129 Hypertensive chronic kidney disease with stage 1 through stage 4 chronic kidney disease, or unspecified chronic kidney disease: Secondary | ICD-10-CM | POA: Diagnosis not present

## 2019-10-03 DIAGNOSIS — M1711 Unilateral primary osteoarthritis, right knee: Secondary | ICD-10-CM | POA: Diagnosis not present

## 2019-10-03 DIAGNOSIS — Z96652 Presence of left artificial knee joint: Secondary | ICD-10-CM | POA: Diagnosis not present

## 2019-10-03 DIAGNOSIS — N183 Chronic kidney disease, stage 3 unspecified: Secondary | ICD-10-CM | POA: Diagnosis not present

## 2019-10-03 DIAGNOSIS — Z471 Aftercare following joint replacement surgery: Secondary | ICD-10-CM | POA: Diagnosis not present

## 2019-10-03 DIAGNOSIS — E785 Hyperlipidemia, unspecified: Secondary | ICD-10-CM | POA: Diagnosis not present

## 2019-10-03 NOTE — Telephone Encounter (Signed)
Patient calling in reference to his [employer] being billed for " in home health".  Perhaps he is referring to physical therapy that the [place] will pay for up to 15 days per patient.

## 2019-10-04 NOTE — Telephone Encounter (Signed)
IC patient. He said that he does not remember calling about this. He said that the only thing he wanted to do was confirm his next appointment. I did confirm this appointment for him.

## 2019-10-05 DIAGNOSIS — I129 Hypertensive chronic kidney disease with stage 1 through stage 4 chronic kidney disease, or unspecified chronic kidney disease: Secondary | ICD-10-CM | POA: Diagnosis not present

## 2019-10-05 DIAGNOSIS — K219 Gastro-esophageal reflux disease without esophagitis: Secondary | ICD-10-CM | POA: Diagnosis not present

## 2019-10-05 DIAGNOSIS — Z471 Aftercare following joint replacement surgery: Secondary | ICD-10-CM | POA: Diagnosis not present

## 2019-10-05 DIAGNOSIS — Z96652 Presence of left artificial knee joint: Secondary | ICD-10-CM | POA: Diagnosis not present

## 2019-10-05 DIAGNOSIS — N183 Chronic kidney disease, stage 3 unspecified: Secondary | ICD-10-CM | POA: Diagnosis not present

## 2019-10-05 DIAGNOSIS — E785 Hyperlipidemia, unspecified: Secondary | ICD-10-CM | POA: Diagnosis not present

## 2019-10-05 DIAGNOSIS — M1711 Unilateral primary osteoarthritis, right knee: Secondary | ICD-10-CM | POA: Diagnosis not present

## 2019-10-06 DIAGNOSIS — E785 Hyperlipidemia, unspecified: Secondary | ICD-10-CM | POA: Diagnosis not present

## 2019-10-06 DIAGNOSIS — Z96652 Presence of left artificial knee joint: Secondary | ICD-10-CM | POA: Diagnosis not present

## 2019-10-06 DIAGNOSIS — I129 Hypertensive chronic kidney disease with stage 1 through stage 4 chronic kidney disease, or unspecified chronic kidney disease: Secondary | ICD-10-CM | POA: Diagnosis not present

## 2019-10-06 DIAGNOSIS — Z471 Aftercare following joint replacement surgery: Secondary | ICD-10-CM | POA: Diagnosis not present

## 2019-10-06 DIAGNOSIS — N183 Chronic kidney disease, stage 3 unspecified: Secondary | ICD-10-CM | POA: Diagnosis not present

## 2019-10-06 DIAGNOSIS — K219 Gastro-esophageal reflux disease without esophagitis: Secondary | ICD-10-CM | POA: Diagnosis not present

## 2019-10-06 DIAGNOSIS — M1711 Unilateral primary osteoarthritis, right knee: Secondary | ICD-10-CM | POA: Diagnosis not present

## 2019-10-08 ENCOUNTER — Other Ambulatory Visit: Payer: Self-pay | Admitting: Surgical

## 2019-10-11 DIAGNOSIS — I129 Hypertensive chronic kidney disease with stage 1 through stage 4 chronic kidney disease, or unspecified chronic kidney disease: Secondary | ICD-10-CM | POA: Diagnosis not present

## 2019-10-11 DIAGNOSIS — N183 Chronic kidney disease, stage 3 unspecified: Secondary | ICD-10-CM | POA: Diagnosis not present

## 2019-10-11 DIAGNOSIS — M1711 Unilateral primary osteoarthritis, right knee: Secondary | ICD-10-CM | POA: Diagnosis not present

## 2019-10-11 DIAGNOSIS — K219 Gastro-esophageal reflux disease without esophagitis: Secondary | ICD-10-CM | POA: Diagnosis not present

## 2019-10-11 DIAGNOSIS — Z471 Aftercare following joint replacement surgery: Secondary | ICD-10-CM | POA: Diagnosis not present

## 2019-10-11 DIAGNOSIS — Z96652 Presence of left artificial knee joint: Secondary | ICD-10-CM | POA: Diagnosis not present

## 2019-10-11 DIAGNOSIS — E785 Hyperlipidemia, unspecified: Secondary | ICD-10-CM | POA: Diagnosis not present

## 2019-10-12 ENCOUNTER — Ambulatory Visit (INDEPENDENT_AMBULATORY_CARE_PROVIDER_SITE_OTHER): Payer: BC Managed Care – PPO | Admitting: Orthopedic Surgery

## 2019-10-12 ENCOUNTER — Encounter: Payer: Self-pay | Admitting: Orthopedic Surgery

## 2019-10-12 VITALS — Ht 75.0 in | Wt 270.0 lb

## 2019-10-12 DIAGNOSIS — Z96652 Presence of left artificial knee joint: Secondary | ICD-10-CM

## 2019-10-14 ENCOUNTER — Encounter: Payer: Self-pay | Admitting: Orthopedic Surgery

## 2019-10-14 DIAGNOSIS — Z471 Aftercare following joint replacement surgery: Secondary | ICD-10-CM | POA: Diagnosis not present

## 2019-10-14 DIAGNOSIS — E785 Hyperlipidemia, unspecified: Secondary | ICD-10-CM | POA: Diagnosis not present

## 2019-10-14 DIAGNOSIS — Z96652 Presence of left artificial knee joint: Secondary | ICD-10-CM | POA: Diagnosis not present

## 2019-10-14 DIAGNOSIS — I129 Hypertensive chronic kidney disease with stage 1 through stage 4 chronic kidney disease, or unspecified chronic kidney disease: Secondary | ICD-10-CM | POA: Diagnosis not present

## 2019-10-14 DIAGNOSIS — K219 Gastro-esophageal reflux disease without esophagitis: Secondary | ICD-10-CM | POA: Diagnosis not present

## 2019-10-14 DIAGNOSIS — M1711 Unilateral primary osteoarthritis, right knee: Secondary | ICD-10-CM | POA: Diagnosis not present

## 2019-10-14 DIAGNOSIS — N183 Chronic kidney disease, stage 3 unspecified: Secondary | ICD-10-CM | POA: Diagnosis not present

## 2019-10-14 MED ORDER — HYDROCODONE-ACETAMINOPHEN 10-325 MG PO TABS: 1.0000 | ORAL_TABLET | Freq: Two times a day (BID) | ORAL | 0 refills | Status: DC | PRN

## 2019-10-14 NOTE — Progress Notes (Signed)
Post-Op Visit Note   Patient: Kristopher SALZWEDEL Sr.           Date of Birth: May 26, 1961           MRN: [id number] Visit Date: 10/12/2019 PCP: Virginia Rochester, PA   Assessment & Plan:  Chief Complaint:  Chief Complaint  Patient presents with  . Left Knee - Follow-up    09/13/2019 Left TKA   Visit Diagnoses:  1. Status post total left knee replacement     Plan: Patient is a 58 year old male who presents to the clinic s/p left total knee arthroplasty on 09/13/2019.  Patient states that he is feeling better than his last appointment.  Home health physical therapy is going well.  He is still ambulating with a walker but feels that he is about to progress to a cane.  He is at 106 degrees on the CPM machine.  He easily flexes past 90 degrees and his extension comes to 10 degrees with a soft endpoint.  His incision is healing well.  Plan to start outpatient physical therapy and refill his hydrocodone (of which she does not take much anymore).  He has some left elbow swelling which appears to be olecranon bursitis.  He has some warmth but no redness, fevers, chills.  I do not think it is infected.  Patient will follow up in 6 weeks for clinical recheck.  Follow-Up Instructions: No follow-ups on file.   Orders:  No orders of the defined types were placed in this encounter.  No orders of the defined types were placed in this encounter.   Imaging: No results found.  PMFS History: Patient Active Problem List   Diagnosis Date Noted  . Arthritis of left knee 09/13/2019  . Pain and swelling of ankle, right 12/09/2017  . Hyperlipidemia with target LDL less than 100 06/18/2017  . Hypertensive heart disease without heart failure 06/18/2017  . Bilateral renal cysts 06/18/2017  . Resistant hypertension 05/18/2017  . H/O hypokalemia 05/18/2017  . Framingham cardiac risk 10-20% in next 10 years 03/17/2017  . Diverticulosis of large intestine without hemorrhage 09/09/2016  . Benign essential  hypertension 07/15/2016  . Acute bilateral ankle pain 09/04/2015  . Closed nondisplaced fracture of navicular bone of left foot 09/04/2015  . Pain in both feet 09/04/2015  . Primary osteoarthritis of both knees 05/30/2015  . Arthralgia of both knees 01/05/2015  . Arthralgia of left hip 01/05/2015  . Abnormal EKG 06/18/2014  . Knee pain 03/28/2014   Past Medical History:  Diagnosis Date  . Ambulates with cane   . Arthritis   . CKD (chronic kidney disease)    stage III (nephrologist: Donato Heinz, MD) 03/2019  . GERD (gastroesophageal reflux disease)   . Hyperlipidemia   . Hypertension   . Seasonal allergies     Family History  Problem Relation Age of Onset  . Cancer Mother   . Cancer Father   . Cancer Sister   . Hypertension Sister   . Cancer Brother   . Hypertension Brother   . Cancer Sister   . Hypertension Sister     Past Surgical History:  Procedure Laterality Date  . COLONOSCOPY    . HAND SURGERY Left 1983  . KNEE ARTHROSCOPY Left 1986  . RENAL ARTERY DOPPLERS  05/29/2017    Normal Renal A Bilaterally.  L > R kidney (3.2 cm) . Renal Cysts noted  - larges ~3.7 cm x 3.5 cm (R kidney), L kidney - ~1.7 cm x  1.6 cm -> recommend referral to Nephrology  . TOTAL KNEE ARTHROPLASTY Left 09/13/2019  . TOTAL KNEE ARTHROPLASTY Left 09/13/2019   Procedure: LEFT TOTAL KNEE ARTHROPLASTY;  Surgeon: Cammy Copa, MD;  Location: [facility] OR;  Service: Orthopedics;  Laterality: Left;  . TRANSTHORACIC ECHOCARDIOGRAM  05/2017   Normal Echo. Mod Conc LVH. EF 55-60%.  No RWMA. Normal Valves. Gr 1 DD.  Marland Kitchen UPPER GI ENDOSCOPY     Social History   Occupational History  . Not on file  Tobacco Use  . Smoking status: Never Smoker  . Smokeless tobacco: Never Used  Substance and Sexual Activity  . Alcohol use: Yes    Comment: occasional beer/wine  . Drug use: No  . Sexual activity: Not on file

## 2019-10-16 ENCOUNTER — Other Ambulatory Visit: Payer: Self-pay | Admitting: Cardiology

## 2019-10-17 DIAGNOSIS — M1732 Unilateral post-traumatic osteoarthritis, left knee: Secondary | ICD-10-CM | POA: Diagnosis not present

## 2019-10-27 ENCOUNTER — Telehealth: Payer: Self-pay | Admitting: Orthopedic Surgery

## 2019-10-27 NOTE — Telephone Encounter (Signed)
Patient called stating that he had a death in the family and wants to know if it will be okay for him to travel in a car.  Patient had surgery in September.  CB#[phone].  Thank you.

## 2019-10-27 NOTE — Telephone Encounter (Signed)
Please advise. Thanks.  

## 2019-10-27 NOTE — Telephone Encounter (Signed)
IC s/w patient and advised. He verbalized understanding.  

## 2019-11-14 ENCOUNTER — Other Ambulatory Visit: Payer: Self-pay | Admitting: Surgical

## 2019-11-14 NOTE — Telephone Encounter (Signed)
Please advise thanks.

## 2019-11-14 NOTE — Telephone Encounter (Signed)
Lauren please advise, thanks. 

## 2019-11-23 ENCOUNTER — Other Ambulatory Visit: Payer: Self-pay

## 2019-11-23 ENCOUNTER — Ambulatory Visit (INDEPENDENT_AMBULATORY_CARE_PROVIDER_SITE_OTHER): Payer: No Typology Code available for payment source | Admitting: Orthopedic Surgery

## 2019-11-23 DIAGNOSIS — Z96652 Presence of left artificial knee joint: Secondary | ICD-10-CM

## 2019-11-25 ENCOUNTER — Encounter: Payer: Self-pay | Admitting: Orthopedic Surgery

## 2019-11-25 NOTE — Progress Notes (Signed)
Post-Op Visit Note   Patient: Kristopher WOODIN Sr.           Date of Birth: 03-Oct-1961           MRN: [id number] Visit Date: 11/23/2019 PCP: Virginia Rochester, PA   Assessment & Plan:  Chief Complaint:  Chief Complaint  Patient presents with  . Left Knee - Follow-up   Visit Diagnoses:  1. Status post total left knee replacement     Plan: Patient is a 58 year old male who presents s/p left total knee arthroplasty on 09/13/2019.  Patient notes that he is doing pretty well.  He is ambulating with a walker but states that this is mostly for his right knee which has been causing him more difficulty than his left knee.  Notes pain is well controlled in his left knee.  Patient denies any shortness of breath, fevers, chills.  Patient had his right knee aspirated of joint effusion on the day of surgery for his left total knee arthroplasty and states that this helped and is inquiring about a cortisone injection for his right knee.  On exam patient has no calf tenderness.  He is compliant with taking aspirin.  His left knee range of motion is excellent with 0 degrees of extension and easily flexes past 105 degrees of flexion.  Incision is healing well.  His right knee is tender to palpation over the medial and lateral joint lines with a 15 degree flexion contracture.  He has a positive effusion of the right knee.  Patient is planning to return to work on 30 December 2019.  Plan for patient to follow-up in 3 weeks for right knee aspiration and injection.  Patient agreed with plan and will follow-up in 3 weeks.  Follow-Up Instructions: No follow-ups on file.   Orders:  No orders of the defined types were placed in this encounter.  No orders of the defined types were placed in this encounter.   Imaging: No results found.  PMFS History: Patient Active Problem List   Diagnosis Date Noted  . Arthritis of left knee 09/13/2019  . Pain and swelling of ankle, right 12/09/2017  . Hyperlipidemia with  target LDL less than 100 06/18/2017  . Hypertensive heart disease without heart failure 06/18/2017  . Bilateral renal cysts 06/18/2017  . Resistant hypertension 05/18/2017  . H/O hypokalemia 05/18/2017  . Framingham cardiac risk 10-20% in next 10 years 03/17/2017  . Diverticulosis of large intestine without hemorrhage 09/09/2016  . Benign essential hypertension 07/15/2016  . Acute bilateral ankle pain 09/04/2015  . Closed nondisplaced fracture of navicular bone of left foot 09/04/2015  . Pain in both feet 09/04/2015  . Primary osteoarthritis of both knees 05/30/2015  . Arthralgia of both knees 01/05/2015  . Arthralgia of left hip 01/05/2015  . Abnormal EKG 06/18/2014  . Knee pain 03/28/2014   Past Medical History:  Diagnosis Date  . Ambulates with cane   . Arthritis   . CKD (chronic kidney disease)    stage III (nephrologist: Donato Heinz, MD) 03/2019  . GERD (gastroesophageal reflux disease)   . Hyperlipidemia   . Hypertension   . Seasonal allergies     Family History  Problem Relation Age of Onset  . Cancer Mother   . Cancer Father   . Cancer Sister   . Hypertension Sister   . Cancer Brother   . Hypertension Brother   . Cancer Sister   . Hypertension Sister     Past Surgical History:  Procedure Laterality Date  . COLONOSCOPY    . HAND SURGERY Left 1983  . KNEE ARTHROSCOPY Left 1986  . RENAL ARTERY DOPPLERS  05/29/2017    Normal Renal A Bilaterally.  L > R kidney (3.2 cm) . Renal Cysts noted  - larges ~3.7 cm x 3.5 cm (R kidney), L kidney - ~1.7 cm x 1.6 cm -> recommend referral to Nephrology  . TOTAL KNEE ARTHROPLASTY Left 09/13/2019  . TOTAL KNEE ARTHROPLASTY Left 09/13/2019   Procedure: LEFT TOTAL KNEE ARTHROPLASTY;  Surgeon: Cammy Copa, MD;  Location: [facility] OR;  Service: Orthopedics;  Laterality: Left;  . TRANSTHORACIC ECHOCARDIOGRAM  05/2017   Normal Echo. Mod Conc LVH. EF 55-60%.  No RWMA. Normal Valves. Gr 1 DD.  Marland Kitchen UPPER GI ENDOSCOPY     Social  History   Occupational History  . Not on file  Tobacco Use  . Smoking status: Never Smoker  . Smokeless tobacco: Never Used  Substance and Sexual Activity  . Alcohol use: Yes    Comment: occasional beer/wine  . Drug use: No  . Sexual activity: Not on file

## 2019-11-27 ENCOUNTER — Encounter: Payer: Self-pay | Admitting: Orthopedic Surgery

## 2019-11-28 ENCOUNTER — Telehealth: Payer: Self-pay | Admitting: Orthopedic Surgery

## 2019-11-28 NOTE — Telephone Encounter (Signed)
Patient called and left message on voicemail stating he needs additional visits for rehab physical therapy.  Patient's surgery was authorized through the [place].    Pt's cb  [phone]

## 2019-11-28 NOTE — Telephone Encounter (Signed)
Please advise if additional PT is needed. If so approximately how much longer so that we can get auth. Thanks.

## 2019-11-29 ENCOUNTER — Ambulatory Visit: Payer: BC Managed Care – PPO | Admitting: Cardiology

## 2019-12-02 NOTE — Telephone Encounter (Signed)
[facility]

## 2019-12-14 ENCOUNTER — Other Ambulatory Visit: Payer: Self-pay | Admitting: Surgical

## 2019-12-14 ENCOUNTER — Ambulatory Visit: Payer: BC Managed Care – PPO | Admitting: Orthopedic Surgery

## 2019-12-21 ENCOUNTER — Ambulatory Visit (INDEPENDENT_AMBULATORY_CARE_PROVIDER_SITE_OTHER): Payer: No Typology Code available for payment source | Admitting: Orthopedic Surgery

## 2019-12-21 ENCOUNTER — Other Ambulatory Visit: Payer: Self-pay | Admitting: Cardiology

## 2019-12-21 ENCOUNTER — Other Ambulatory Visit: Payer: Self-pay

## 2019-12-21 DIAGNOSIS — M1711 Unilateral primary osteoarthritis, right knee: Secondary | ICD-10-CM | POA: Diagnosis not present

## 2019-12-22 ENCOUNTER — Encounter: Payer: Self-pay | Admitting: Orthopedic Surgery

## 2019-12-22 DIAGNOSIS — M1711 Unilateral primary osteoarthritis, right knee: Secondary | ICD-10-CM

## 2019-12-22 MED ORDER — METHYLPREDNISOLONE ACETATE 40 MG/ML IJ SUSP
40.0000 mg | INTRAMUSCULAR | Status: AC | PRN
  Administered 2019-12-22: 40 mg via INTRA_ARTICULAR

## 2019-12-22 MED ORDER — BUPIVACAINE HCL 0.25 % IJ SOLN
4.0000 mL | INTRAMUSCULAR | Status: AC | PRN
  Administered 2019-12-22: 4 mL via INTRA_ARTICULAR

## 2019-12-22 MED ORDER — LIDOCAINE HCL 1 % IJ SOLN
5.0000 mL | INTRAMUSCULAR | Status: AC | PRN
  Administered 2019-12-22: 5 mL

## 2019-12-22 NOTE — Progress Notes (Signed)
Office Visit Note   Patient: Kristopher NEWBERN Sr.           Date of Birth: 1961/08/09           MRN: [id number] Visit Date: 12/21/2019 Requested by: Daylene Katayama, PA [address] [place],  [place] [address] PCP: Daylene Katayama, PA  Subjective: Chief Complaint  Patient presents with  . Follow-up    HPI: Kristopher [name] is a patient with left knee replacement performed 09/13/2019.  He is [name] well with that.  Also reports right knee pain and arthritis.  Has more rehab for the left knee.  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  He does have end-stage right knee arthritis as well.              ROS: All systems reviewed are negative as they relate to the chief complaint within the history of present illness.  Patient denies  fevers or chills.   Assessment & Plan: Visit Diagnoses: No diagnosis found.  Plan: Impression is right knee arthritis with well-functioning left total knee replacement.  Right knee is aspirated and injected today.  We will get him posted for total knee replacement not sooner than 2 months from now.  Continue with leg strengthening and quad strengthening exercises for both the left knee postoperatively in the right knee preoperatively.  Follow-Up Instructions: No follow-ups on file.   Orders:  No orders of the defined types were placed in this encounter.  No orders of the defined types were placed in this encounter.     Procedures: Large Joint Inj: R knee on 12/22/2019 8:46 AM Indications: diagnostic evaluation, joint swelling and pain Details: 18 G 1.5 in needle, superolateral approach  Arthrogram: No  Medications: 5 mL lidocaine 1 %; 40 mg methylPREDNISolone acetate 40 MG/ML; 4 mL bupivacaine 0.25 % Outcome: tolerated well, no immediate complications Procedure, treatment alternatives, risks and benefits explained, specific risks discussed. Consent was given by the patient. Immediately prior to procedure a time out  was called to verify the correct patient, procedure, equipment, support staff and site/side marked as required. Patient was prepped and draped in the usual sterile fashion.       Clinical Data: No additional findings.  Objective: Vital Signs: There were no vitals taken for this visit.  Physical Exam:   Constitutional: Patient appears well-developed HEENT:  Head: Normocephalic Eyes:EOM are normal Neck: Normal range of motion Cardiovascular: Normal rate Pulmonary/chest: Effort normal Neurologic: Patient is alert Skin: Skin is warm Psychiatric: Patient has normal mood and affect    Ortho Exam: Ortho exam demonstrates full active and passive range of motion of that ankle and hip on the right-hand side.  Knee has about 5 degree flexion contracture and flexion to about 100 degrees.  Left knee is doing well with no effusion.  Right knee also has Baker's cyst and mild effusion.  Patient also has medial and lateral joint line tenderness but fairly reasonable alignment slight varus.  Specialty Comments:  No specialty comments available.  Imaging: No results found.   PMFS History: Patient Active Problem List   Diagnosis Date Noted  . Arthritis of left knee 09/13/2019  . Pain and swelling of ankle, right 12/09/2017  . Hyperlipidemia with target LDL less than 100 06/18/2017  . Hypertensive heart disease without heart failure 06/18/2017  . Bilateral renal cysts 06/18/2017  . Resistant hypertension 05/18/2017  . H/O hypokalemia 05/18/2017  . Framingham cardiac risk 10-20% in  next 10 years 03/17/2017  . Diverticulosis of large intestine without hemorrhage 09/09/2016  . Benign essential hypertension 07/15/2016  . Acute bilateral ankle pain 09/04/2015  . Closed nondisplaced fracture of navicular bone of left foot 09/04/2015  . Pain in both feet 09/04/2015  . Primary osteoarthritis of both knees 05/30/2015  . Arthralgia of both knees 01/05/2015  . Arthralgia of left hip 01/05/2015    . Abnormal EKG 06/18/2014  . Knee pain 03/28/2014   Past Medical History:  Diagnosis Date  . Ambulates with cane   . Arthritis   . CKD (chronic kidney disease)    stage III (nephrologist: Donato Heinz, MD) 03/2019  . GERD (gastroesophageal reflux disease)   . Hyperlipidemia   . Hypertension   . Seasonal allergies     Family History  Problem Relation Age of Onset  . Cancer Mother   . Cancer Father   . Cancer Sister   . Hypertension Sister   . Cancer Brother   . Hypertension Brother   . Cancer Sister   . Hypertension Sister     Past Surgical History:  Procedure Laterality Date  . COLONOSCOPY    . HAND SURGERY Left 1983  . KNEE ARTHROSCOPY Left 1986  . RENAL ARTERY DOPPLERS  05/29/2017    Normal Renal A Bilaterally.  L > R kidney (3.2 cm) . Renal Cysts noted  - larges ~3.7 cm x 3.5 cm (R kidney), L kidney - ~1.7 cm x 1.6 cm -> recommend referral to Nephrology  . TOTAL KNEE ARTHROPLASTY Left 09/13/2019  . TOTAL KNEE ARTHROPLASTY Left 09/13/2019   Procedure: LEFT TOTAL KNEE ARTHROPLASTY;  Surgeon: Meredith Pel, MD;  Location: [facility];  Service: Orthopedics;  Laterality: Left;  . TRANSTHORACIC ECHOCARDIOGRAM  05/2017   Normal Echo. Mod Conc LVH. EF 55-60%.  No RWMA. Normal Valves. Gr 1 DD.  Marland Kitchen UPPER GI ENDOSCOPY     Social History   Occupational History  . Not on file  Tobacco Use  . Smoking status: Never Smoker  . Smokeless tobacco: Never Used  Substance and Sexual Activity  . Alcohol use: Yes    Comment: occasional beer/wine  . Drug use: No  . Sexual activity: Not on file

## 2020-01-01 ENCOUNTER — Other Ambulatory Visit: Payer: Self-pay | Admitting: Cardiology

## 2020-01-24 ENCOUNTER — Other Ambulatory Visit: Payer: Self-pay | Admitting: Surgical

## 2020-01-24 NOTE — Telephone Encounter (Signed)
Pls advise.  

## 2020-02-20 ENCOUNTER — Other Ambulatory Visit: Payer: Self-pay | Admitting: Cardiology

## 2020-02-25 ENCOUNTER — Other Ambulatory Visit: Payer: Self-pay | Admitting: Surgical

## 2020-02-28 ENCOUNTER — Telehealth: Payer: Self-pay | Admitting: Cardiology

## 2020-02-28 NOTE — Telephone Encounter (Signed)
Spoke with patient stated that at this time he only has [employer] and can only going to the [facility] for his care.

## 2020-03-01 ENCOUNTER — Other Ambulatory Visit: Payer: Self-pay

## 2020-03-01 ENCOUNTER — Ambulatory Visit (INDEPENDENT_AMBULATORY_CARE_PROVIDER_SITE_OTHER): Payer: No Typology Code available for payment source

## 2020-03-01 ENCOUNTER — Ambulatory Visit (INDEPENDENT_AMBULATORY_CARE_PROVIDER_SITE_OTHER): Payer: No Typology Code available for payment source | Admitting: Orthopedic Surgery

## 2020-03-01 DIAGNOSIS — M25532 Pain in left wrist: Secondary | ICD-10-CM

## 2020-03-01 DIAGNOSIS — M1711 Unilateral primary osteoarthritis, right knee: Secondary | ICD-10-CM | POA: Diagnosis not present

## 2020-03-01 DIAGNOSIS — M10032 Idiopathic gout, left wrist: Secondary | ICD-10-CM

## 2020-03-02 ENCOUNTER — Encounter: Payer: Self-pay | Admitting: Orthopedic Surgery

## 2020-03-02 NOTE — Progress Notes (Signed)
Office Visit Note   Patient: Kristopher LORTIE Sr.           Date of Birth: 1961/04/19           MRN: [id number] Visit Date: 03/01/2020 Requested by: Virginia Rochester, [place] [address] [place],  [place] [address] PCP: Virginia Rochester, PA  Subjective: Chief Complaint  Patient presents with  . Right Knee - Pain    HPI: Kristopher WEATHERALL Sr. is a 59 y.o. male who presents to the office complaining of right knee pain.  Patient returns the office to discuss chronic right knee pain due to significant right knee osteoarthritis.  He is doing well with his left total knee replacement.  He is ready to schedule surgery with the right total knee replacement.  He desires surgery in April.  He is not taking any narcotic/opioid medications for pain.  Denies any history of blood clots, smoking.  He does note that he was just diagnosed with diabetes recently and his A1c is 8.9.  This will be rechecked next week.  Additionally patient notes wrist pain for about 1 month.  He denies any injury stating that he woke up with his wrist feeling red hot and swollen.  He has reduced range of motion according to him.  He does have a history of gout.  He has tried a brace which made his pain worse.  Pain continues to improve.  Denies any fevers or chills..                ROS:  All systems reviewed are negative as they relate to the chief complaint within the history of present illness.  Patient denies fevers or chills.  Assessment & Plan: Visit Diagnoses:  1. Arthritis of right knee   2. Acute idiopathic gout of left wrist     Plan: Patient is a 59 year old male who presents with complaint of right knee pain.  He has history of arthritis of the right knee.  He is doing well with left total knee replacement and desires to schedule right knee replacement.  Due to his recent diagnosis of diabetes with A1c of 8.9, we will have to wait until his A1c is below 8 before proceeding with surgery.  Patient  understands.  He will call the office next week with his latest A1c results.  Regarding the wrist pain, radiographs taken today were negative for any bony deformity, fracture, dislocation.  Impression is gout.  Discussed options available to patient including aspiration with injection versus anti-inflammatories versus stay the course.  Patient desires to let his wrist continue to improve without intervention and he will return to the office if his pain stagnates or worsens.  Follow-Up Instructions: Return if symptoms worsen or fail to improve.   Orders:  Orders Placed This Encounter  Procedures  . XR Wrist Complete Left   No orders of the defined types were placed in this encounter.     Procedures: No procedures performed   Clinical Data: No additional findings.  Objective: Vital Signs: There were no vitals taken for this visit.  Physical Exam:  Constitutional: Patient appears well-developed HEENT:  Head: Normocephalic Eyes:EOM are normal Neck: Normal range of motion Cardiovascular: Normal rate Pulmonary/chest: Effort normal Neurologic: Patient is alert Skin: Skin is warm Psychiatric: Patient has normal mood and affect  Ortho Exam:  Right knee Exam Positive effusion Tender to palpation over the medial and lateral joint lines. Extensor mechanism intact No TTP over the quad  tendon, patellar tendon, pes anserinus, patella, tibial tubercle, LCL/MCL insertions Stable to varus/valgus stresses.  Stable to anterior/posterior drawer Extension to 10 degrees with flexion contracture Flexion > 90 degrees  Left wrist exam Diffusely tender throughout the left wrist with no focal area of tenderness.  Left wrist pronation, supination, flexion, extension slightly reduced compared with contralateral side.  Diffuse swelling throughout the left wrist.  Specialty Comments:  No specialty comments available.  Imaging: No results found.   PMFS History: Patient Active Problem List    Diagnosis Date Noted  . Arthritis of left knee 09/13/2019  . Pain and swelling of ankle, right 12/09/2017  . Hyperlipidemia with target LDL less than 100 06/18/2017  . Hypertensive heart disease without heart failure 06/18/2017  . Bilateral renal cysts 06/18/2017  . Resistant hypertension 05/18/2017  . H/O hypokalemia 05/18/2017  . Framingham cardiac risk 10-20% in next 10 years 03/17/2017  . Diverticulosis of large intestine without hemorrhage 09/09/2016  . Benign essential hypertension 07/15/2016  . Acute bilateral ankle pain 09/04/2015  . Closed nondisplaced fracture of navicular bone of left foot 09/04/2015  . Pain in both feet 09/04/2015  . Primary osteoarthritis of both knees 05/30/2015  . Arthralgia of both knees 01/05/2015  . Arthralgia of left hip 01/05/2015  . Abnormal EKG 06/18/2014  . Knee pain 03/28/2014   Past Medical History:  Diagnosis Date  . Ambulates with cane   . Arthritis   . CKD (chronic kidney disease)    stage III (nephrologist: Terrial Rhodes, MD) 03/2019  . GERD (gastroesophageal reflux disease)   . Hyperlipidemia   . Hypertension   . Seasonal allergies     Family History  Problem Relation Age of Onset  . Cancer Mother   . Cancer Father   . Cancer Sister   . Hypertension Sister   . Cancer Brother   . Hypertension Brother   . Cancer Sister   . Hypertension Sister     Past Surgical History:  Procedure Laterality Date  . COLONOSCOPY    . HAND SURGERY Left 1983  . KNEE ARTHROSCOPY Left 1986  . RENAL ARTERY DOPPLERS  05/29/2017    Normal Renal A Bilaterally.  L > R kidney (3.2 cm) . Renal Cysts noted  - larges ~3.7 cm x 3.5 cm (R kidney), L kidney - ~1.7 cm x 1.6 cm -> recommend referral to Nephrology  . TOTAL KNEE ARTHROPLASTY Left 09/13/2019  . TOTAL KNEE ARTHROPLASTY Left 09/13/2019   Procedure: LEFT TOTAL KNEE ARTHROPLASTY;  Surgeon: Cammy Copa, MD;  Location: [facility] OR;  Service: Orthopedics;  Laterality: Left;  . TRANSTHORACIC  ECHOCARDIOGRAM  05/2017   Normal Echo. Mod Conc LVH. EF 55-60%.  No RWMA. Normal Valves. Gr 1 DD.  Marland Kitchen UPPER GI ENDOSCOPY     Social History   Occupational History  . Not on file  Tobacco Use  . Smoking status: Never Smoker  . Smokeless tobacco: Never Used  Substance and Sexual Activity  . Alcohol use: Yes    Comment: occasional beer/wine  . Drug use: No  . Sexual activity: Not on file

## 2020-03-07 ENCOUNTER — Other Ambulatory Visit: Payer: Self-pay

## 2020-03-07 ENCOUNTER — Telehealth: Payer: Self-pay | Admitting: Orthopedic Surgery

## 2020-03-07 NOTE — Telephone Encounter (Signed)
Patient called with information for Dr. August Saucer. Patient stated Dr. August Saucer asked for patient to call in results of A1-C. Patient's A1-C is 8.0. Patient phone number is [phone]

## 2020-03-07 NOTE — Progress Notes (Signed)
Patient called this morning. Message sent to Dr August Saucer.

## 2020-03-07 NOTE — Telephone Encounter (Signed)
Will plan on schedule tka for April pls clal thx

## 2020-03-07 NOTE — Telephone Encounter (Signed)
See note from Dr Dean. Thanks.  

## 2020-03-07 NOTE — Telephone Encounter (Signed)
Please advise. Thanks.  

## 2020-03-08 NOTE — Telephone Encounter (Signed)
Patient scheduled for Right Total Knee Arthroplasty 04-05-20 @12 :15pm [facility].   Patient is aware of location, date & time.    CPM for Knee Rx sent to [facility] for in Dorene Sorrow test & post op appointments made.   X-ray copies in [employer].

## 2020-03-08 NOTE — Telephone Encounter (Signed)
thx

## 2020-03-08 NOTE — Telephone Encounter (Signed)
FYI

## 2020-03-15 ENCOUNTER — Other Ambulatory Visit: Payer: Self-pay | Admitting: Cardiology

## 2020-03-27 NOTE — Progress Notes (Signed)
[id number] [place],  - [address] AT [address] [place] [address] Phone: [phone] Fax: [phone]  [facility] [place], [place] - [address] [phone] [facility] [address] [place] [address] Phone: [phone] Fax: [phone]      Your procedure is scheduled on April 8  Report to [facility] Main Entrance "A" at 0830 A.M., and check in at the Admitting office.  Call this number if you have problems the morning of surgery:  [phone]  Call [phone] if you have any questions prior to your surgery date Monday-Friday 8am-4pm    Remember:  Do not eat  after midnight the night before your surgery  You may drink clear liquids until 0730 am the morning of your surgery.   Clear liquids allowed are: Water, Non-Citrus Juices (without pulp), Carbonated Beverages, Clear Tea, Black Coffee Only, and Gatorade   Enhanced Recovery after Surgery for Orthopedics Enhanced Recovery after Surgery is a protocol used to improve the stress on your body and your recovery after surgery.  Patient Instructions  . The night before surgery:  o No food after midnight. ONLY clear liquids after midnight  . The day of surgery (if you have diabetes): o  o Drink ONE (1) Small 8oz bottle of water as directed. o This drink was given to you during your hospital  pre-op appointment visit.  o The pre-op nurse will instruct you on the time to drink the   water depending on your surgery time. Nothing else to drink after drinking that small bottle of water         If you have questions, please contact your surgeon's office.     Take these medicines the morning of surgery with A SIP OF WATER  allopurinol (ZYLOPRIM)  atorvastatin (LIPITOR) BYSTOLIC chlorthalidone (HYGROTON)  Follow your surgeon's instructions on when to stop Aspirin.  If no instructions were given by your  surgeon then you will need to call the office to get those instructions.    As of today, STOP taking any Aspirin containing products, Aleve, Naproxen, Ibuprofen, Motrin, Advil, Goody's, BC's, all herbal medications, fish oil, and all vitamins.           WHAT DO I DO ABOUT MY DIABETES MEDICATION?   Marland Kitchen Do not take oral diabetes medicines (pills) the morning of surgery. glipiZIDE (GLUCOTROL) and metFORMIN (GLUCOPHAGE.   HOW TO MANAGE YOUR DIABETES BEFORE AND AFTER SURGERY  Why is it important to control my blood sugar before and after surgery? . Improving blood sugar levels before and after surgery helps healing and can limit problems. . A way of improving blood sugar control is eating a healthy diet by: o  Eating less sugar and carbohydrates o  Increasing activity/exercise o  Talking with your doctor about reaching your blood sugar goals . High blood sugars (greater than 180 mg/dL) can raise your risk of infections and slow your recovery, so you will need to focus on controlling your diabetes during the weeks before surgery. . Make sure that the doctor who takes care of your diabetes knows about your planned surgery including the date and location.  How do I manage my blood sugar before surgery? . Check your blood sugar at least 4 times a day, starting 2 days before surgery, to make sure that the level is not too high or low. . Check your blood sugar the morning of your surgery when you wake up  and every 2 hours until you get to the Short Stay unit. o If your blood sugar is less than 70 mg/dL, you will need to treat for low blood sugar: - Do not take insulin. - Treat a low blood sugar (less than 70 mg/dL) with  cup of clear juice (cranberry or apple), 4 glucose tablets, OR glucose gel. - Recheck blood sugar in 15 minutes after treatment (to make sure it is greater than 70 mg/dL). If your blood sugar is not greater than 70 mg/dL on recheck, call [phone] for further  instructions. . Report your blood sugar to the short stay nurse when you get to Short Stay.  . If you are admitted to the hospital after surgery: o Your blood sugar will be checked by the staff and you will probably be given insulin after surgery (instead of oral diabetes medicines) to make sure you have good blood sugar levels. o The goal for blood sugar control after surgery is 80-180 mg/dL.             Do not wear jewelry, make up, or nail polish            Do not wear lotions, powders, perfumes/colognes, or deodorant.            Do not shave 48 hours prior to surgery.  Men may shave face and neck.            Do not bring valuables to the hospital.            [facility] is not responsible for any belongings or valuables.  Do NOT Smoke (Tobacco/Vapping) or drink Alcohol 24 hours prior to your procedure If you use a CPAP at night, you may bring all equipment for your overnight stay.   Contacts, glasses, dentures or bridgework may not be worn into surgery.      For patients admitted to the hospital, discharge time will be determined by your treatment team.   Patients discharged the day of surgery will not be allowed to drive home, and someone needs to stay with them for 24 hours.    Special instructions:   [place]- Preparing For Surgery  Before surgery, you can play an important role. Because skin is not sterile, your skin needs to be as free of germs as possible. You can reduce the number of germs on your skin by washing with CHG (chlorahexidine gluconate) Soap before surgery.  CHG is an antiseptic cleaner which kills germs and bonds with the skin to continue killing germs even after washing.    Oral Hygiene is also important to reduce your risk of infection.  Remember - BRUSH YOUR TEETH THE MORNING OF SURGERY WITH YOUR REGULAR TOOTHPASTE  Please do not use if you have an allergy to CHG or antibacterial soaps. If your skin becomes reddened/irritated stop using the CHG.  Do not  shave (including legs and underarms) for at least 48 hours prior to first CHG shower. It is OK to shave your face.  Please follow these instructions carefully.   1. Shower the NIGHT BEFORE SURGERY and the MORNING OF SURGERY with CHG Soap.   2. If you chose to wash your hair, wash your hair first as usual with your normal shampoo.  3. After you shampoo, rinse your hair and body thoroughly to remove the shampoo.  4. Use CHG as you would any other liquid soap. You can apply CHG directly to the skin and wash gently with a scrungie or  a clean washcloth.   5. Apply the CHG Soap to your body ONLY FROM THE NECK DOWN.  Do not use on open wounds or open sores. Avoid contact with your eyes, ears, mouth and genitals (private parts). Wash Face and genitals (private parts)  with your normal soap.   6. Wash thoroughly, paying special attention to the area where your surgery will be performed.  7. Thoroughly rinse your body with warm water from the neck down.  8. DO NOT shower/wash with your normal soap after using and rinsing off the CHG Soap.  9. Pat yourself dry with a CLEAN TOWEL.  10. Wear CLEAN PAJAMAS to bed the night before surgery, wear comfortable clothes the morning of surgery  11. Place CLEAN SHEETS on your bed the night of your first shower and DO NOT SLEEP WITH PETS.   Day of Surgery:   Do not apply any deodorants/lotions.  Please wear clean clothes to the hospital/surgery center.   Remember to brush your teeth WITH YOUR REGULAR TOOTHPASTE.   Please read over the following fact sheets that you were given.

## 2020-03-28 ENCOUNTER — Encounter (HOSPITAL_COMMUNITY): Payer: Self-pay

## 2020-03-28 ENCOUNTER — Other Ambulatory Visit: Payer: Self-pay

## 2020-03-28 ENCOUNTER — Encounter (HOSPITAL_COMMUNITY)
Admission: RE | Admit: 2020-03-28 | Discharge: 2020-03-28 | Disposition: A | Discharge status: Home / Self Care | Payer: No Typology Code available for payment source | Source: Ambulatory Visit | Attending: Orthopedic Surgery | Admitting: Orthopedic Surgery

## 2020-03-28 DIAGNOSIS — Z01812 Encounter for preprocedural laboratory examination: Secondary | ICD-10-CM | POA: Diagnosis present

## 2020-03-28 DIAGNOSIS — Z01810 Encounter for preprocedural cardiovascular examination: Secondary | ICD-10-CM | POA: Insufficient documentation

## 2020-03-28 HISTORY — DX: Type 2 diabetes mellitus without complications: E11.9

## 2020-03-28 LAB — CBC
HCT: 38.7 % — ABNORMAL LOW (ref 39.0–52.0)
Hemoglobin: 12.1 g/dL — ABNORMAL LOW (ref 13.0–17.0)
MCH: 28.3 pg (ref 26.0–34.0)
MCHC: 31.3 g/dL (ref 30.0–36.0)
MCV: 90.4 fL (ref 80.0–100.0)
Platelets: 377 10*3/uL (ref 150–400)
RBC: 4.28 MIL/uL (ref 4.22–5.81)
RDW: 14.1 % (ref 11.5–15.5)
WBC: 7.7 10*3/uL (ref 4.0–10.5)
nRBC: 0 % (ref 0.0–0.2)

## 2020-03-28 LAB — SURGICAL PCR SCREEN
MRSA, PCR: NEGATIVE
Staphylococcus aureus: NEGATIVE

## 2020-03-28 LAB — URINALYSIS, ROUTINE W REFLEX MICROSCOPIC
Bilirubin Urine: NEGATIVE
Glucose, UA: NEGATIVE mg/dL
Hgb urine dipstick: NEGATIVE
Ketones, ur: NEGATIVE mg/dL
Leukocytes,Ua: NEGATIVE
Nitrite: NEGATIVE
Protein, ur: NEGATIVE mg/dL
Specific Gravity, Urine: 1.01 (ref 1.005–1.030)
pH: 5 (ref 5.0–8.0)

## 2020-03-28 LAB — BASIC METABOLIC PANEL
Anion gap: 8 (ref 5–15)
BUN: 33 mg/dL — ABNORMAL HIGH (ref 6–20)
CO2: 24 mmol/L (ref 22–32)
Calcium: 10 mg/dL (ref 8.9–10.3)
Chloride: 105 mmol/L (ref 98–111)
Creatinine, Ser: 1.69 mg/dL — ABNORMAL HIGH (ref 0.61–1.24)
GFR calc Af Amer: 50 mL/min — ABNORMAL LOW (ref >60)
GFR calc non Af Amer: 43 mL/min — ABNORMAL LOW (ref >60)
Glucose, Bld: 95 mg/dL (ref 70–99)
Potassium: 4.5 mmol/L (ref 3.5–5.1)
Sodium: 137 mmol/L (ref 135–145)

## 2020-03-28 LAB — HEMOGLOBIN A1C
Hgb A1c MFr Bld: 7.2 % — ABNORMAL HIGH (ref 4.8–5.6)
Mean Plasma Glucose: 159.94 mg/dL

## 2020-03-28 LAB — GLUCOSE, CAPILLARY: Glucose-Capillary: 98 mg/dL (ref 70–99)

## 2020-03-28 NOTE — Progress Notes (Signed)
PCP - [facility] Cardiologist - in the past Dr. Bryan Lemma  Chest x-ray - not needed EKG - 03/28/20 Stress Test - >10 years  ECHO - 05/29/17 Cardiac Cath - denies  Fasting Blood Sugar - 120s Checks Blood Sugar _1x every other day  Aspirin Instructions: will talk to dr. dean  ERAS Protcol - yes PRE-SURGERY Ensure or G2- gave small bottle of water  COVID TEST- 04/02/20   Anesthesia review: yes, records requested  Patient denies shortness of breath, fever, cough and chest pain at PAT appointment   All instructions explained to the patient, with a verbal understanding of the material. Patient agrees to go over the instructions while at home for a better understanding. Patient also instructed to self quarantine after being tested for COVID-19. The opportunity to ask questions was provided.

## 2020-03-28 NOTE — Progress Notes (Signed)
Spoke with Debbie with Dr. Alfonso Patten office about aspirin instructions..per Dr August Saucer patient is to Stop Aspirin tomorrow.  Will contact patient to make him aware of that.   2:46 PM Spoke with patient about stopping aspirin

## 2020-03-29 ENCOUNTER — Other Ambulatory Visit: Payer: Self-pay | Admitting: Surgical

## 2020-03-29 NOTE — Anesthesia Preprocedure Evaluation (Addendum)
Anesthesia Evaluation  Patient identified by MRN, date of birth, ID band Patient awake    Reviewed: Allergy & Precautions, NPO status , Patient's Chart, lab work & pertinent test results  Airway Mallampati: II  TM Distance: >3 FB Neck ROM: Full    Dental no notable dental hx. (+) Teeth Intact, Dental Advisory Given   Pulmonary neg pulmonary ROS,    Pulmonary exam normal breath sounds clear to auscultation       Cardiovascular hypertension, Pt. on medications Normal cardiovascular exam Rhythm:Regular Rate:Normal     Neuro/Psych negative neurological ROS  negative psych ROS   GI/Hepatic Neg liver ROS, GERD  Medicated,  Endo/Other  negative endocrine ROSdiabetes  Renal/GU CRFRenal disease  negative genitourinary   Musculoskeletal  (+) Arthritis , Osteoarthritis,    Abdominal   Peds negative pediatric ROS (+)  Hematology negative hematology ROS (+)   Anesthesia Other Findings   Reproductive/Obstetrics negative OB ROS                            Anesthesia Physical Anesthesia Plan  ASA: II  Anesthesia Plan: Spinal and MAC   Post-op Pain Management:  Regional for Post-op pain   Induction:   PONV Risk Score and Plan: 2  Airway Management Planned: Nasal Cannula, Simple Face Mask and Mask  Additional Equipment:   Intra-op Plan:   Post-operative Plan:   Informed Consent: I have reviewed the patients History and Physical, chart, labs and discussed the procedure including the risks, benefits and alternatives for the proposed anesthesia with the patient or authorized representative who has indicated his/her understanding and acceptance.     Dental advisory given  Plan Discussed with: Anesthesiologist, CRNA and Surgeon  Anesthesia Plan Comments: (EKG: 03/28/20: Normal sinus rhythm Minimal voltage criteria for LVH, may be normal variant ( Sokolow-Lyon ) Borderline ECG No significant  change since last tracing Confirmed by Lyman Bishop [phone]) on 03/28/2020 11:40:52 PM   CV: Renal US 06/04/17: Impressions Technically challenging study. Normal caliber abdominal aorta. Normal right kidney size. Large left kidney, which measures 3.2 cm larger than the right kidney. Normal renal arteries, bilaterally. The IVC and renal veins are patent. Multiple cysts in the right kidney, with the largest measuring 3.7 cm x 3.5 cm. Cyst in the left kidney measures 1.7 cm x 1.6 cm.   Echo 05/29/17: Study Conclusions - Left ventricle: There was moderate concentric hypertrophy. Systolic function was normal. The estimated ejection fraction was in the range of 55% to 60%. Wall motion was normal; there were no regional wall motion abnormalities. Doppler parameters are consistent with abnormal left ventricular relaxation (grade 1 diastolic dysfunction). - Aortic valve: There was mild regurgitation. - Left atrium: The atrium was mildly dilated. - Right ventricle: The cavity size was mildly dilated. - Right atrium: The atrium was mildly dilated. - Atrial septum: No defect or patent foramen ovale was identified. )      Anesthesia Quick Evaluation

## 2020-03-29 NOTE — Progress Notes (Signed)
Anesthesia Chart Review:  Case: [id number] Date/Time: 04/05/20 1015   Procedure: RIGHT TOTAL KNEE ARTHROPLASTY (Right Knee)   Anesthesia type: Spinal   Pre-op diagnosis: right knee osteoarthritis   Location: [facility] OR ROOM 06 / [place] OR   Surgeons: Meredith Pel, MD      DISCUSSION: Patient is a 59 year old male scheduled for the above procedure. He is s/p left TKA 09/13/19. He was diagnosed with DM2 since then, so this surgery not scheduled until A1c improved--7.2% on 03/28/20.  History includes never smoker, HTN, HLD, CKD (stage III, due to HTN), DM2, GERD. BMI is consistent with obesity.  He was seen by cardiologist Dr. Ellyn Hack in 2018 for difficult to treat HTN. EKG showed LVH with stable repolarization changes since 2015. Echo ordered to get baseline function and showed normal LVEF with no wall motion abnormalities, grade 1 diastolic dysfunction. Renal US also ordered which did not show any RAS, but did show renal cysts, so he was referred to nephrology. Medication changes made and has had overall improvement in BP control. He had ED visits on 04/28/18 and 10/09/18 for atypical chest pain, felt to be musculoskeletal in nature. Last visit with Dr. Ellyn Hack was on 06/30/18 following one of those visits. No additional cardiac testing ordered.   Preoperative Cr 1.69 (up from 1.58 09/02/19). EGFR 50 consistent with known CKD stage III.   He tolerated similar procedure ~ 6 months ago. He denied SOB, cough, fever, chest pain at PAT RN visit. PAT BP reasonable at 145/86. EKG stable. A1c < 7.5%. Based on currently available information, I would anticipate that he can proceed as planned if no acute changes.   Presurgical COVID-19 test is scheduled for 04/02/20. Last ASA 03/29/20.    VS: BP (!) 145/86   Pulse 82   Temp 37.2 C (Oral)   Resp 18   Ht '6\' 3"'$  (1.905 m)   Wt 123.5 kg   SpO2 98%   BMI 34.04 kg/m    PROVIDERS: Virginia Rochester, PA is listed as PCP ([facility]). He says he sees "Dr. Opal Sidles" at  the [facility]. Glenetta Hew, MD is cardiologist. Last visit 06/30/18. Donato Heinz, MD is nephrologist. 04/25/19 note by Juanell Fairly, NP-C scanned under Media tab. BUN 32, Cr 1.50, eGFR 58 at that time.  - Rheumatologist is with [facility]. Seen on 06/24/19 by Tana Coast, PA-C for chronic gout. (See Care Everywhere)   LABS: Labs reviewed: Acceptable for surgery. (all labs ordered are listed, but only abnormal results are displayed)  Labs Reviewed  BASIC METABOLIC PANEL - Abnormal; Notable for the following components:      Result Value   BUN 33 (*)    Creatinine, Ser 1.69 (*)    GFR calc non Af Amer 43 (*)    GFR calc Af Amer 50 (*)    All other components within normal limits  CBC - Abnormal; Notable for the following components:   Hemoglobin 12.1 (*)    HCT 38.7 (*)    All other components within normal limits  HEMOGLOBIN A1C - Abnormal; Notable for the following components:   Hgb A1c MFr Bld 7.2 (*)    All other components within normal limits  SURGICAL PCR SCREEN  URINE CULTURE  GLUCOSE, CAPILLARY  URINALYSIS, ROUTINE W REFLEX MICROSCOPIC   Lab Results  Component Value Date   CREATININE 1.69 (H) 03/28/2020   CREATININE 1.58 (H) 09/02/2019   CREATININE 1.52 (H) 10/09/2018     EKG: 03/28/20: Normal sinus  rhythm Minimal voltage criteria for LVH, may be normal variant ( Sokolow-Lyon ) Borderline ECG No significant change since last tracing Confirmed by Lyman Bishop [phone]) on 03/28/2020 11:40:52 PM   CV: Renal US 06/04/17: Impressions Technically challenging study. Normal caliber abdominal aorta. Normal right kidney size. Large left kidney, which measures 3.2 cm larger than the right kidney. Normal renal arteries, bilaterally. The IVC and renal veins are patent. Multiple cysts in the right kidney, with the largest measuring 3.7 cm x 3.5 cm. Cyst in the left kidney measures 1.7 cm x 1.6 cm.   Echo 05/29/17: Study Conclusions - Left ventricle:  There was moderate concentric hypertrophy. Systolic function was normal. The estimated ejection fraction was in the range of 55% to 60%. Wall motion was normal; there were no regional wall motion abnormalities. Doppler parameters are consistent with abnormal left ventricular relaxation (grade 1 diastolic dysfunction). - Aortic valve: There was mild regurgitation. - Left atrium: The atrium was mildly dilated. - Right ventricle: The cavity size was mildly dilated. - Right atrium: The atrium was mildly dilated. - Atrial septum: No defect or patent foramen ovale was identified.   He reported a normal stress test "years ago", unknown location.   Past Medical History:  Diagnosis Date  . Ambulates with cane   . Arthritis   . CKD (chronic kidney disease)    stage III (nephrologist: Donato Heinz, MD) 03/2019  . Diabetes mellitus without complication (HCC)    Type 2  . GERD (gastroesophageal reflux disease)   . Hyperlipidemia   . Hypertension   . Seasonal allergies     Past Surgical History:  Procedure Laterality Date  . COLONOSCOPY     x2  . HAND SURGERY Left 1983  . KNEE ARTHROSCOPY Left 1986  . MULTIPLE TOOTH EXTRACTIONS    . RENAL ARTERY DOPPLERS  05/29/2017    Normal Renal A Bilaterally.  L > R kidney (3.2 cm) . Renal Cysts noted  - larges ~3.7 cm x 3.5 cm (R kidney), L kidney - ~1.7 cm x 1.6 cm -> recommend referral to Nephrology  . TOTAL KNEE ARTHROPLASTY Left 09/13/2019  . TOTAL KNEE ARTHROPLASTY Left 09/13/2019   Procedure: LEFT TOTAL KNEE ARTHROPLASTY;  Surgeon: Meredith Pel, MD;  Location: [place];  Service: Orthopedics;  Laterality: Left;  . TRANSTHORACIC ECHOCARDIOGRAM  05/2017   Normal Echo. Mod Conc LVH. EF 55-60%.  No RWMA. Normal Valves. Gr 1 DD.  Marland Kitchen UPPER GI ENDOSCOPY     2x    MEDICATIONS: . carvedilol (COREG) 25 MG tablet  . allopurinol (ZYLOPRIM) 100 MG tablet  . aspirin ([employer] ASPIRIN ADULT LOW DOSE) 81 MG chewable tablet  .  atorvastatin (LIPITOR) 10 MG tablet  . BYSTOLIC 20 MG TABS  . chlorthalidone (HYGROTON) 25 MG tablet  . docusate sodium (COLACE) 100 MG capsule  . glipiZIDE (GLUCOTROL) 5 MG tablet  . HYDROcodone-acetaminophen (NORCO) 10-325 MG tablet  . KLOR-CON M10 10 MEQ tablet  . losartan (COZAAR) 25 MG tablet  . metFORMIN (GLUCOPHAGE) 1000 MG tablet  . methocarbamol (ROBAXIN) 500 MG tablet  . spironolactone (ALDACTONE) 50 MG tablet   No current facility-administered medications for this encounter.    Myra Gianotti, PA-C Surgical Short Stay/Anesthesiology [facility] Phone [phone] [facility] Phone [phone] 03/29/2020 11:44 AM

## 2020-04-02 ENCOUNTER — Other Ambulatory Visit (HOSPITAL_COMMUNITY)
Admission: RE | Admit: 2020-04-02 | Discharge: 2020-04-02 | Disposition: A | Discharge status: Home / Self Care | Payer: No Typology Code available for payment source | Source: Ambulatory Visit | Attending: Orthopedic Surgery | Admitting: Orthopedic Surgery

## 2020-04-02 LAB — SARS CORONAVIRUS 2 (TAT 6-24 HRS): SARS Coronavirus 2: NEGATIVE

## 2020-04-04 MED ORDER — DEXTROSE 5 % IV SOLN
3.0000 g | INTRAVENOUS | Status: DC
  Filled 2020-04-04: qty 3000

## 2020-04-04 MED ORDER — DEXTROSE 5 % IV SOLN
3.0000 g | INTRAVENOUS | Status: AC
  Administered 2020-04-05: 3 g via INTRAVENOUS
  Filled 2020-04-04 (×2): qty 3000

## 2020-04-05 ENCOUNTER — Inpatient Hospital Stay (HOSPITAL_COMMUNITY): Payer: No Typology Code available for payment source | Admitting: Anesthesiology

## 2020-04-05 ENCOUNTER — Other Ambulatory Visit: Payer: Self-pay

## 2020-04-05 ENCOUNTER — Inpatient Hospital Stay (HOSPITAL_COMMUNITY): Payer: No Typology Code available for payment source | Admitting: Vascular Surgery

## 2020-04-05 ENCOUNTER — Encounter (HOSPITAL_COMMUNITY): Payer: Self-pay | Admitting: Orthopedic Surgery

## 2020-04-05 ENCOUNTER — Inpatient Hospital Stay (HOSPITAL_COMMUNITY)
Admission: RE | Admit: 2020-04-05 | Discharge: 2020-04-06 | DRG: 470 | Disposition: A | Discharge status: Home Health | Payer: No Typology Code available for payment source | Attending: Orthopedic Surgery | Admitting: Orthopedic Surgery

## 2020-04-05 ENCOUNTER — Encounter (HOSPITAL_COMMUNITY)
Admission: RE | Disposition: A | Discharge status: Home / Self Care | Payer: Self-pay | Source: Home / Self Care | Attending: Orthopedic Surgery

## 2020-04-05 DIAGNOSIS — M1711 Unilateral primary osteoarthritis, right knee: Principal | ICD-10-CM

## 2020-04-05 DIAGNOSIS — Z20822 Contact with and (suspected) exposure to covid-19: Secondary | ICD-10-CM | POA: Diagnosis present

## 2020-04-05 DIAGNOSIS — Z96652 Presence of left artificial knee joint: Secondary | ICD-10-CM | POA: Diagnosis present

## 2020-04-05 DIAGNOSIS — E785 Hyperlipidemia, unspecified: Secondary | ICD-10-CM | POA: Diagnosis present

## 2020-04-05 DIAGNOSIS — Z6834 Body mass index (BMI) 34.0-34.9, adult: Secondary | ICD-10-CM

## 2020-04-05 DIAGNOSIS — K219 Gastro-esophageal reflux disease without esophagitis: Secondary | ICD-10-CM | POA: Diagnosis present

## 2020-04-05 DIAGNOSIS — Z7984 Long term (current) use of oral hypoglycemic drugs: Secondary | ICD-10-CM | POA: Diagnosis not present

## 2020-04-05 DIAGNOSIS — N189 Chronic kidney disease, unspecified: Secondary | ICD-10-CM | POA: Diagnosis present

## 2020-04-05 DIAGNOSIS — I129 Hypertensive chronic kidney disease with stage 1 through stage 4 chronic kidney disease, or unspecified chronic kidney disease: Secondary | ICD-10-CM | POA: Diagnosis present

## 2020-04-05 DIAGNOSIS — E669 Obesity, unspecified: Secondary | ICD-10-CM | POA: Diagnosis present

## 2020-04-05 DIAGNOSIS — Z79899 Other long term (current) drug therapy: Secondary | ICD-10-CM | POA: Diagnosis not present

## 2020-04-05 DIAGNOSIS — Z8249 Family history of ischemic heart disease and other diseases of the circulatory system: Secondary | ICD-10-CM | POA: Diagnosis not present

## 2020-04-05 DIAGNOSIS — E1122 Type 2 diabetes mellitus with diabetic chronic kidney disease: Secondary | ICD-10-CM | POA: Diagnosis present

## 2020-04-05 HISTORY — PX: TOTAL KNEE ARTHROPLASTY: SHX125

## 2020-04-05 LAB — GLUCOSE, CAPILLARY
Glucose-Capillary: 117 mg/dL — ABNORMAL HIGH (ref 70–99)
Glucose-Capillary: 116 mg/dL — ABNORMAL HIGH (ref 70–99)
Glucose-Capillary: 148 mg/dL — ABNORMAL HIGH (ref 70–99)
Glucose-Capillary: 251 mg/dL — ABNORMAL HIGH (ref 70–99)

## 2020-04-05 SURGERY — ARTHROPLASTY, KNEE, TOTAL
Anesthesia: Monitor Anesthesia Care | Site: Knee | Laterality: Right

## 2020-04-05 MED ORDER — ONDANSETRON HCL 4 MG/2ML IJ SOLN: 4.0000 mg | Freq: Four times a day (QID) | INTRAMUSCULAR | Status: DC | PRN

## 2020-04-05 MED ORDER — INSULIN ASPART 100 UNIT/ML ~~LOC~~ SOLN
0.0000 [IU] | Freq: Three times a day (TID) | SUBCUTANEOUS | Status: DC
  Administered 2020-04-06: 3 [IU] via SUBCUTANEOUS
  Administered 2020-04-06 (×2): 2 [IU] via SUBCUTANEOUS

## 2020-04-05 MED ORDER — METOCLOPRAMIDE HCL 5 MG/ML IJ SOLN: 5.0000 mg | Freq: Three times a day (TID) | INTRAMUSCULAR | Status: DC | PRN

## 2020-04-05 MED ORDER — TRANEXAMIC ACID 1000 MG/10ML IV SOLN
2000.0000 mg | Freq: Once | INTRAVENOUS | Status: DC
  Filled 2020-04-05 (×2): qty 20

## 2020-04-05 MED ORDER — SPIRONOLACTONE 25 MG PO TABS
50.0000 mg | ORAL_TABLET | Freq: Every day | ORAL | Status: DC
  Administered 2020-04-06: 50 mg via ORAL
  Filled 2020-04-05: qty 2

## 2020-04-05 MED ORDER — SODIUM CHLORIDE 0.9% FLUSH
INTRAVENOUS | Status: DC | PRN
  Administered 2020-04-05: 20 mL via INTRAVENOUS

## 2020-04-05 MED ORDER — LACTATED RINGERS IV SOLN: INTRAVENOUS | Status: DC

## 2020-04-05 MED ORDER — FENTANYL CITRATE (PF) 250 MCG/5ML IJ SOLN
INTRAMUSCULAR | Status: AC
  Filled 2020-04-05: qty 5

## 2020-04-05 MED ORDER — SODIUM CHLORIDE 0.9 % IR SOLN
Status: DC | PRN
  Administered 2020-04-05: 3000 mL

## 2020-04-05 MED ORDER — PROMETHAZINE HCL 25 MG/ML IJ SOLN: 6.2500 mg | INTRAMUSCULAR | Status: DC | PRN

## 2020-04-05 MED ORDER — LIDOCAINE 2% (20 MG/ML) 5 ML SYRINGE
INTRAMUSCULAR | Status: AC
  Filled 2020-04-05: qty 10

## 2020-04-05 MED ORDER — FENTANYL CITRATE (PF) 100 MCG/2ML IJ SOLN
INTRAMUSCULAR | Status: AC
  Administered 2020-04-05: 100 ug via INTRAVENOUS
  Filled 2020-04-05: qty 2

## 2020-04-05 MED ORDER — OXYCODONE HCL 5 MG PO TABS
5.0000 mg | ORAL_TABLET | ORAL | Status: DC | PRN
  Administered 2020-04-05 – 2020-04-06 (×4): 10 mg via ORAL
  Filled 2020-04-05 (×4): qty 2

## 2020-04-05 MED ORDER — SUCCINYLCHOLINE CHLORIDE 200 MG/10ML IV SOSY
PREFILLED_SYRINGE | INTRAVENOUS | Status: AC
  Filled 2020-04-05: qty 10

## 2020-04-05 MED ORDER — HYDROMORPHONE HCL 1 MG/ML IJ SOLN
INTRAMUSCULAR | Status: AC
  Filled 2020-04-05: qty 1

## 2020-04-05 MED ORDER — PROPOFOL 10 MG/ML IV BOLUS
INTRAVENOUS | Status: DC | PRN
  Administered 2020-04-05: 30 mg via INTRAVENOUS

## 2020-04-05 MED ORDER — ATORVASTATIN CALCIUM 10 MG PO TABS
10.0000 mg | ORAL_TABLET | Freq: Every day | ORAL | Status: DC
  Administered 2020-04-06: 10 mg via ORAL
  Filled 2020-04-05: qty 1

## 2020-04-05 MED ORDER — CHLORTHALIDONE 25 MG PO TABS
25.0000 mg | ORAL_TABLET | Freq: Every day | ORAL | Status: DC
  Administered 2020-04-06: 25 mg via ORAL
  Filled 2020-04-05: qty 1

## 2020-04-05 MED ORDER — PHENYLEPHRINE HCL-NACL 10-0.9 MG/250ML-% IV SOLN
INTRAVENOUS | Status: DC | PRN
  Administered 2020-04-05: 35 ug/min via INTRAVENOUS

## 2020-04-05 MED ORDER — TRANEXAMIC ACID-NACL 1000-0.7 MG/100ML-% IV SOLN
INTRAVENOUS | Status: AC
  Filled 2020-04-05: qty 100

## 2020-04-05 MED ORDER — PROPOFOL 500 MG/50ML IV EMUL
INTRAVENOUS | Status: DC | PRN
  Administered 2020-04-05: 100 ug/kg/min via INTRAVENOUS

## 2020-04-05 MED ORDER — ACETAMINOPHEN 10 MG/ML IV SOLN
1000.0000 mg | Freq: Once | INTRAVENOUS | Status: DC | PRN
  Administered 2020-04-05: 1000 mg via INTRAVENOUS

## 2020-04-05 MED ORDER — DOCUSATE SODIUM 100 MG PO CAPS
100.0000 mg | ORAL_CAPSULE | Freq: Two times a day (BID) | ORAL | Status: DC
  Administered 2020-04-05 – 2020-04-06 (×2): 100 mg via ORAL
  Filled 2020-04-05 (×2): qty 1

## 2020-04-05 MED ORDER — BUPIVACAINE HCL (PF) 0.25 % IJ SOLN
INTRAMUSCULAR | Status: AC
  Filled 2020-04-05: qty 30

## 2020-04-05 MED ORDER — ONDANSETRON HCL 4 MG PO TABS: 4.0000 mg | ORAL_TABLET | Freq: Four times a day (QID) | ORAL | Status: DC | PRN

## 2020-04-05 MED ORDER — STERILE WATER FOR IRRIGATION IR SOLN: Status: DC | PRN

## 2020-04-05 MED ORDER — BUPIVACAINE HCL 0.25 % IJ SOLN
INTRAMUSCULAR | Status: DC | PRN
  Administered 2020-04-05: 20 mL
  Administered 2020-04-05: 10 mL

## 2020-04-05 MED ORDER — MEPERIDINE HCL 25 MG/ML IJ SOLN: 6.2500 mg | INTRAMUSCULAR | Status: DC | PRN

## 2020-04-05 MED ORDER — IRRISEPT - 450ML BOTTLE WITH 0.05% CHG IN STERILE WATER, USP 99.95% OPTIME
TOPICAL | Status: DC | PRN
  Administered 2020-04-05: 450 mL

## 2020-04-05 MED ORDER — ROPIVACAINE HCL 7.5 MG/ML IJ SOLN
INTRAMUSCULAR | Status: DC | PRN
  Administered 2020-04-05: 30 mL via PERINEURAL

## 2020-04-05 MED ORDER — FENTANYL CITRATE (PF) 100 MCG/2ML IJ SOLN
INTRAMUSCULAR | Status: DC | PRN
  Administered 2020-04-05 (×2): 25 ug via INTRAVENOUS

## 2020-04-05 MED ORDER — ONDANSETRON HCL 4 MG/2ML IJ SOLN
INTRAMUSCULAR | Status: AC
  Filled 2020-04-05: qty 4

## 2020-04-05 MED ORDER — HYDROCODONE-ACETAMINOPHEN 7.5-325 MG PO TABS
1.0000 | ORAL_TABLET | Freq: Once | ORAL | Status: AC | PRN
  Administered 2020-04-05: 1 via ORAL

## 2020-04-05 MED ORDER — GLIPIZIDE 5 MG PO TABS: 5.0000 mg | ORAL_TABLET | Freq: Every day | ORAL | Status: DC

## 2020-04-05 MED ORDER — HYDROMORPHONE HCL 1 MG/ML IJ SOLN
0.5000 mg | INTRAMUSCULAR | Status: DC | PRN
  Administered 2020-04-06 (×2): 0.5 mg via INTRAVENOUS
  Filled 2020-04-05 (×2): qty 1

## 2020-04-05 MED ORDER — METFORMIN HCL 500 MG PO TABS: 1000.0000 mg | ORAL_TABLET | Freq: Two times a day (BID) | ORAL | Status: DC

## 2020-04-05 MED ORDER — METHOCARBAMOL 1000 MG/10ML IJ SOLN
500.0000 mg | Freq: Four times a day (QID) | INTRAVENOUS | Status: DC | PRN
  Filled 2020-04-05: qty 5

## 2020-04-05 MED ORDER — CEFAZOLIN SODIUM-DEXTROSE 2-4 GM/100ML-% IV SOLN
2.0000 g | Freq: Four times a day (QID) | INTRAVENOUS | Status: AC
  Administered 2020-04-05 – 2020-04-06 (×2): 2 g via INTRAVENOUS
  Filled 2020-04-05 (×2): qty 100

## 2020-04-05 MED ORDER — PROPOFOL 10 MG/ML IV BOLUS
INTRAVENOUS | Status: AC
  Filled 2020-04-05: qty 20

## 2020-04-05 MED ORDER — ACETAMINOPHEN 325 MG PO TABS: 325.0000 mg | ORAL_TABLET | Freq: Four times a day (QID) | ORAL | Status: DC | PRN

## 2020-04-05 MED ORDER — TRANEXAMIC ACID-NACL 1000-0.7 MG/100ML-% IV SOLN
1000.0000 mg | INTRAVENOUS | Status: AC
  Administered 2020-04-05: 12:00:00 1000 mg via INTRAVENOUS
  Filled 2020-04-05: qty 100

## 2020-04-05 MED ORDER — BUPIVACAINE LIPOSOME 1.3 % IJ SUSP
INTRAMUSCULAR | Status: DC | PRN
  Administered 2020-04-05: 20 mL

## 2020-04-05 MED ORDER — ONDANSETRON HCL 4 MG/2ML IJ SOLN
INTRAMUSCULAR | Status: DC | PRN
  Administered 2020-04-05: 4 mg via INTRAVENOUS

## 2020-04-05 MED ORDER — TRANEXAMIC ACID 1000 MG/10ML IV SOLN
INTRAVENOUS | Status: DC | PRN
  Administered 2020-04-05: 2000 mg via TOPICAL

## 2020-04-05 MED ORDER — HYDROMORPHONE HCL 1 MG/ML IJ SOLN
0.2500 mg | INTRAMUSCULAR | Status: DC | PRN
  Administered 2020-04-05 (×4): 0.5 mg via INTRAVENOUS

## 2020-04-05 MED ORDER — ROCURONIUM BROMIDE 10 MG/ML (PF) SYRINGE
PREFILLED_SYRINGE | INTRAVENOUS | Status: AC
  Filled 2020-04-05: qty 20

## 2020-04-05 MED ORDER — HYDROCODONE-ACETAMINOPHEN 7.5-325 MG PO TABS
ORAL_TABLET | ORAL | Status: AC
  Filled 2020-04-05: qty 1

## 2020-04-05 MED ORDER — LIDOCAINE HCL (CARDIAC) PF 100 MG/5ML IV SOSY
PREFILLED_SYRINGE | INTRAVENOUS | Status: DC | PRN
  Administered 2020-04-05: 100 mg via INTRAVENOUS

## 2020-04-05 MED ORDER — MORPHINE SULFATE (PF) 4 MG/ML IV SOLN
INTRAVENOUS | Status: AC
  Filled 2020-04-05: qty 2

## 2020-04-05 MED ORDER — FENTANYL CITRATE (PF) 100 MCG/2ML IJ SOLN: 100.0000 ug | Freq: Once | INTRAMUSCULAR | Status: AC

## 2020-04-05 MED ORDER — MORPHINE SULFATE (PF) 4 MG/ML IV SOLN
INTRAVENOUS | Status: DC | PRN
  Administered 2020-04-05: 8 mg

## 2020-04-05 MED ORDER — MENTHOL 3 MG MT LOZG: 1.0000 | LOZENGE | OROMUCOSAL | Status: DC | PRN

## 2020-04-05 MED ORDER — BUPIVACAINE HCL (PF) 0.25 % IJ SOLN
INTRAMUSCULAR | Status: AC
  Filled 2020-04-05: qty 10

## 2020-04-05 MED ORDER — NEBIVOLOL HCL 20 MG PO TABS: 20.0000 mg | ORAL_TABLET | Freq: Every day | ORAL | Status: DC

## 2020-04-05 MED ORDER — INSULIN ASPART 100 UNIT/ML ~~LOC~~ SOLN
4.0000 [IU] | Freq: Three times a day (TID) | SUBCUTANEOUS | Status: DC
  Administered 2020-04-06 (×3): 4 [IU] via SUBCUTANEOUS

## 2020-04-05 MED ORDER — METOCLOPRAMIDE HCL 5 MG PO TABS: 5.0000 mg | ORAL_TABLET | Freq: Three times a day (TID) | ORAL | Status: DC | PRN

## 2020-04-05 MED ORDER — 0.9 % SODIUM CHLORIDE (POUR BTL) OPTIME
TOPICAL | Status: DC | PRN
  Administered 2020-04-05 (×2): 1000 mL

## 2020-04-05 MED ORDER — POVIDONE-IODINE 10 % EX SWAB: 2.0000 "application " | Freq: Once | CUTANEOUS | Status: DC

## 2020-04-05 MED ORDER — PHENOL 1.4 % MT LIQD: 1.0000 | OROMUCOSAL | Status: DC | PRN

## 2020-04-05 MED ORDER — CLONIDINE HCL (ANALGESIA) 100 MCG/ML EP SOLN
EPIDURAL | Status: DC | PRN
  Administered 2020-04-05: 1 mL

## 2020-04-05 MED ORDER — BUPIVACAINE LIPOSOME 1.3 % IJ SUSP
20.0000 mL | Freq: Once | INTRAMUSCULAR | Status: DC
  Filled 2020-04-05 (×2): qty 20

## 2020-04-05 MED ORDER — ASPIRIN 81 MG PO CHEW
81.0000 mg | CHEWABLE_TABLET | Freq: Two times a day (BID) | ORAL | Status: DC
  Administered 2020-04-05 – 2020-04-06 (×2): 81 mg via ORAL
  Filled 2020-04-05 (×2): qty 1

## 2020-04-05 MED ORDER — BUPIVACAINE IN DEXTROSE 0.75-8.25 % IT SOLN
INTRATHECAL | Status: DC | PRN
  Administered 2020-04-05: 2 mL via INTRATHECAL

## 2020-04-05 MED ORDER — DEXAMETHASONE SODIUM PHOSPHATE 10 MG/ML IJ SOLN
INTRAMUSCULAR | Status: DC | PRN
  Administered 2020-04-05: 10 mg

## 2020-04-05 MED ORDER — LOSARTAN POTASSIUM 25 MG PO TABS
25.0000 mg | ORAL_TABLET | Freq: Every day | ORAL | Status: DC
  Administered 2020-04-06: 25 mg via ORAL
  Filled 2020-04-05: qty 1

## 2020-04-05 MED ORDER — CLONIDINE HCL (ANALGESIA) 100 MCG/ML EP SOLN
EPIDURAL | Status: AC
  Filled 2020-04-05: qty 10

## 2020-04-05 MED ORDER — CHLORHEXIDINE GLUCONATE 4 % EX LIQD: 60.0000 mL | Freq: Once | CUTANEOUS | Status: DC

## 2020-04-05 MED ORDER — ACETAMINOPHEN 500 MG PO TABS
1000.0000 mg | ORAL_TABLET | Freq: Four times a day (QID) | ORAL | Status: DC
  Administered 2020-04-05 – 2020-04-06 (×3): 1000 mg via ORAL
  Filled 2020-04-05 (×4): qty 2

## 2020-04-05 MED ORDER — METHOCARBAMOL 500 MG PO TABS
500.0000 mg | ORAL_TABLET | Freq: Four times a day (QID) | ORAL | Status: DC | PRN
  Administered 2020-04-05 – 2020-04-06 (×2): 500 mg via ORAL
  Filled 2020-04-05 (×2): qty 1

## 2020-04-05 MED ORDER — DEXAMETHASONE SODIUM PHOSPHATE 10 MG/ML IJ SOLN
INTRAMUSCULAR | Status: AC
  Filled 2020-04-05: qty 2

## 2020-04-05 MED ORDER — MIDAZOLAM HCL 2 MG/2ML IJ SOLN
INTRAMUSCULAR | Status: AC
  Administered 2020-04-05: 2 mg via INTRAVENOUS
  Filled 2020-04-05: qty 2

## 2020-04-05 MED ORDER — CARVEDILOL 12.5 MG PO TABS
12.5000 mg | ORAL_TABLET | Freq: Two times a day (BID) | ORAL | Status: DC
  Administered 2020-04-05 – 2020-04-06 (×3): 12.5 mg via ORAL
  Filled 2020-04-05 (×3): qty 1

## 2020-04-05 MED ORDER — ACETAMINOPHEN 10 MG/ML IV SOLN
INTRAVENOUS | Status: AC
  Filled 2020-04-05: qty 100

## 2020-04-05 MED ORDER — MIDAZOLAM HCL 2 MG/2ML IJ SOLN: 2.0000 mg | Freq: Once | INTRAMUSCULAR | Status: AC

## 2020-04-05 SURGICAL SUPPLY — 81 items
BAG DECANTER FOR FLEXI CONT (MISCELLANEOUS) ×2 IMPLANT
BANDAGE ESMARK 6X9 LF (GAUZE/BANDAGES/DRESSINGS) ×1 IMPLANT
BLADE SAG 18X100X1.27 (BLADE) ×2 IMPLANT
BNDG CMPR 9X6 STRL LF SNTH (GAUZE/BANDAGES/DRESSINGS) ×1
BNDG CMPR MED 10X6 ELC LF (GAUZE/BANDAGES/DRESSINGS) ×1
BNDG CMPR MED 15X6 ELC VLCR LF (GAUZE/BANDAGES/DRESSINGS) ×1
BNDG COHESIVE 6X5 TAN STRL LF (GAUZE/BANDAGES/DRESSINGS) ×2 IMPLANT
BNDG ELASTIC 6X10 VLCR STRL LF (GAUZE/BANDAGES/DRESSINGS) ×1 IMPLANT
BNDG ELASTIC 6X15 VLCR STRL LF (GAUZE/BANDAGES/DRESSINGS) ×2 IMPLANT
BNDG ESMARK 6X9 LF (GAUZE/BANDAGES/DRESSINGS) ×2
BOWL SMART MIX CTS (DISPOSABLE) IMPLANT
BSPLAT TIB 6 KN TRITANIUM (Knees) ×1 IMPLANT
CLOSURE STERI-STRIP 1/4X4 (GAUZE/BANDAGES/DRESSINGS) ×1 IMPLANT
CNTNR URN SCR LID CUP LEK RST (MISCELLANEOUS) ×1 IMPLANT
COMPONENT TRI CR FEM SZ5 KNEE (Orthopedic Implant) IMPLANT
CONT SPEC 4OZ STRL OR WHT (MISCELLANEOUS) ×2
COVER SURGICAL LIGHT HANDLE (MISCELLANEOUS) ×2 IMPLANT
COVER WAND RF STERILE (DRAPES) ×2 IMPLANT
CUFF TOURN SGL QUICK 34 (TOURNIQUET CUFF) ×2
CUFF TOURN SGL QUICK 42 (TOURNIQUET CUFF) IMPLANT
CUFF TRNQT CYL 34X4.125X (TOURNIQUET CUFF) ×1 IMPLANT
DECANTER SPIKE VIAL GLASS SM (MISCELLANEOUS) ×2 IMPLANT
DRAPE INCISE IOBAN 66X45 STRL (DRAPES) IMPLANT
DRAPE ORTHO SPLIT 77X108 STRL (DRAPES) ×6
DRAPE SURG ORHT 6 SPLT 77X108 (DRAPES) ×3 IMPLANT
DRAPE U-SHAPE 47X51 STRL (DRAPES) ×2 IMPLANT
DRSG AQUACEL AG ADV 3.5X14 (GAUZE/BANDAGES/DRESSINGS) ×1 IMPLANT
DURAPREP 26ML APPLICATOR (WOUND CARE) ×4 IMPLANT
ELECT CAUTERY BLADE 6.4 (BLADE) ×2 IMPLANT
ELECT REM PT RETURN 9FT ADLT (ELECTROSURGICAL) ×2
ELECTRODE REM PT RTRN 9FT ADLT (ELECTROSURGICAL) ×1 IMPLANT
GAUZE SPONGE 4X4 12PLY STRL (GAUZE/BANDAGES/DRESSINGS) ×2 IMPLANT
GLOVE BIOGEL PI IND STRL 7.0 (GLOVE) ×1 IMPLANT
GLOVE BIOGEL PI IND STRL 8 (GLOVE) ×1 IMPLANT
GLOVE BIOGEL PI INDICATOR 7.0 (GLOVE) ×1
GLOVE BIOGEL PI INDICATOR 8 (GLOVE) ×1
GLOVE ECLIPSE 7.0 STRL STRAW (GLOVE) ×2 IMPLANT
GLOVE ECLIPSE 8.0 STRL XLNG CF (GLOVE) ×2 IMPLANT
GOWN STRL REUS W/ TWL LRG LVL3 (GOWN DISPOSABLE) ×3 IMPLANT
GOWN STRL REUS W/TWL LRG LVL3 (GOWN DISPOSABLE) ×6
HANDPIECE INTERPULSE COAX TIP (DISPOSABLE) ×2
HOOD PEEL AWAY FLYTE STAYCOOL (MISCELLANEOUS) ×6 IMPLANT
IMMOBILIZER KNEE 20 (SOFTGOODS)
IMMOBILIZER KNEE 20 THIGH 36 (SOFTGOODS) IMPLANT
IMMOBILIZER KNEE 22 UNIV (SOFTGOODS) ×1 IMPLANT
IMMOBILIZER KNEE 24 THIGH 36 (MISCELLANEOUS) IMPLANT
IMMOBILIZER KNEE 24 UNIV (MISCELLANEOUS)
INSERT TRIATH X3 CS SZ6 12 (Insert) ×1 IMPLANT
KIT BASIN OR (CUSTOM PROCEDURE TRAY) ×2 IMPLANT
KIT TURNOVER KIT B (KITS) ×2 IMPLANT
KNEE PATELLA ASYMMETRIC 10X35 (Knees) ×1 IMPLANT
KNEE TIBIAL COMPONENT SZ6 (Knees) ×1 IMPLANT
MANIFOLD NEPTUNE II (INSTRUMENTS) ×2 IMPLANT
NDL SPNL 18GX3.5 QUINCKE PK (NEEDLE) ×1 IMPLANT
NEEDLE 22X1 1/2 (OR ONLY) (NEEDLE) ×4 IMPLANT
NEEDLE SPNL 18GX3.5 QUINCKE PK (NEEDLE) ×2 IMPLANT
NS IRRIG 1000ML POUR BTL (IV SOLUTION) ×4 IMPLANT
PACK TOTAL JOINT (CUSTOM PROCEDURE TRAY) ×2 IMPLANT
PAD ARMBOARD 7.5X6 YLW CONV (MISCELLANEOUS) ×4 IMPLANT
PAD CAST 4YDX4 CTTN HI CHSV (CAST SUPPLIES) ×1 IMPLANT
PADDING CAST COTTON 4X4 STRL (CAST SUPPLIES) ×2
PADDING CAST COTTON 6X4 STRL (CAST SUPPLIES) ×2 IMPLANT
PIN FLUTED HEDLESS FIX 3.5X1/8 (PIN) ×1 IMPLANT
SET HNDPC FAN SPRY TIP SCT (DISPOSABLE) ×1 IMPLANT
STRIP CLOSURE SKIN 1/2X4 (GAUZE/BANDAGES/DRESSINGS) ×4 IMPLANT
SUCTION FRAZIER HANDLE 10FR (MISCELLANEOUS) ×2
SUCTION TUBE FRAZIER 10FR DISP (MISCELLANEOUS) ×1 IMPLANT
SUT MNCRL AB 3-0 PS2 18 (SUTURE) ×2 IMPLANT
SUT VIC AB 0 CT1 27 (SUTURE) ×16
SUT VIC AB 0 CT1 27XBRD ANBCTR (SUTURE) ×3 IMPLANT
SUT VIC AB 1 CT1 27 (SUTURE) ×14
SUT VIC AB 1 CT1 27XBRD ANBCTR (SUTURE) ×5 IMPLANT
SUT VIC AB 2-0 CT1 27 (SUTURE) ×8
SUT VIC AB 2-0 CT1 TAPERPNT 27 (SUTURE) ×4 IMPLANT
SYR 30ML LL (SYRINGE) ×6 IMPLANT
SYR TB 1ML LUER SLIP (SYRINGE) ×2 IMPLANT
TOWEL GREEN STERILE (TOWEL DISPOSABLE) ×4 IMPLANT
TOWEL GREEN STERILE FF (TOWEL DISPOSABLE) ×4 IMPLANT
TRAY CATH 16FR W/PLASTIC CATH (SET/KITS/TRAYS/PACK) IMPLANT
TRI CRUC RET FEM SZ5 KNEE (Orthopedic Implant) ×2 IMPLANT
WATER STERILE IRR 1000ML POUR (IV SOLUTION) ×1 IMPLANT

## 2020-04-05 NOTE — Anesthesia Procedure Notes (Signed)
Spinal  Patient location during procedure: OR Start time: 04/05/2020 11:35 AM End time: 04/05/2020 11:40 AM Staffing Anesthesiologist: Bethena Midget, MD Preanesthetic Checklist Completed: patient identified, IV checked, site marked, risks and benefits discussed, surgical consent, monitors and equipment checked, pre-op evaluation and timeout performed Spinal Block Patient position: sitting Prep: DuraPrep Patient monitoring: heart rate, cardiac monitor, continuous pulse ox and blood pressure Approach: midline Location: L3-4 Injection technique: single-shot Needle Needle type: Sprotte  Needle gauge: 24 G Needle length: 9 cm Assessment Sensory level: T4

## 2020-04-05 NOTE — Brief Op Note (Signed)
   04/05/2020  2:49 PM  PATIENT:  Kristopher Mcalpine Sr.  58 y.o. male  PRE-OPERATIVE DIAGNOSIS:  right knee osteoarthritis  POST-OPERATIVE DIAGNOSIS:  right knee osteoarthritis  PROCEDURE:  Procedure(s): RIGHT TOTAL KNEE ARTHROPLASTY  SURGEON:  Surgeon(s): Cammy Copa, MD  ASSISTANT: magnant pa  ANESTHESIA:   spinal  EBL: 100 ml    Total I/O In: 1050 [I.V.:1000; IV Piggyback:50] Out: 550 [Urine:450; Blood:100]  BLOOD ADMINISTERED: none  DRAINS: none   LOCAL MEDICATIONS USED: marcaine mso4 clonidine exparel  SPECIMEN:  No Specimen  COUNTS:  YES  TOURNIQUET:   Total Tourniquet Time Documented: Thigh (Right) - 103 minutes Total: Thigh (Right) - 103 minutes   DICTATION: .Other Dictation: Dictation Number [id number]  PLAN OF CARE: Admit for overnight observation  PATIENT DISPOSITION:  PACU - hemodynamically stable

## 2020-04-05 NOTE — H&P (Addendum)
TOTAL KNEE ADMISSION H&P  Patient is being admitted for right total knee arthroplasty.  Subjective:  Chief Complaint:right knee pain.  HPI: Kristopher STOVALL Sr., 59 y.o. male, has a history of pain and functional disability in the right knee due to arthritis and has failed non-surgical conservative treatments for greater than 12 weeks to includeNSAID's and/or analgesics, corticosteriod injections and activity modification.  Onset of symptoms was gradual, starting 8 years ago with gradually worsening course since that time. The patient noted no past surgery on the right knee(s).  Patient currently rates pain in the right knee(s) at 8 out of 10 with activity. Patient has night pain, worsening of pain with activity and weight bearing, pain that interferes with activities of daily living, pain with passive range of motion, crepitus and joint swelling.  Patient has evidence of subchondral sclerosis and joint space narrowing by imaging studies. This patient has had A good result of knee replacement.  Plan is to match resection and implant size for the right total knee replacement.. There is no active infection.  Patient Active Problem List   Diagnosis Date Noted  . Arthritis of left knee 09/13/2019  . Pain and swelling of ankle, right 12/09/2017  . Hyperlipidemia with target LDL less than 100 06/18/2017  . Hypertensive heart disease without heart failure 06/18/2017  . Bilateral renal cysts 06/18/2017  . Resistant hypertension 05/18/2017  . H/O hypokalemia 05/18/2017  . Framingham cardiac risk 10-20% in next 10 years 03/17/2017  . Diverticulosis of large intestine without hemorrhage 09/09/2016  . Benign essential hypertension 07/15/2016  . Acute bilateral ankle pain 09/04/2015  . Closed nondisplaced fracture of navicular bone of left foot 09/04/2015  . Pain in both feet 09/04/2015  . Primary osteoarthritis of both knees 05/30/2015  . Arthralgia of both knees 01/05/2015  . Arthralgia of left hip  01/05/2015  . Abnormal EKG 06/18/2014  . Knee pain 03/28/2014   Past Medical History:  Diagnosis Date  . Ambulates with cane   . Arthritis   . CKD (chronic kidney disease)    stage III (nephrologist: Terrial Rhodes, MD) 03/2019  . Diabetes mellitus without complication (HCC)    Type 2  . GERD (gastroesophageal reflux disease)   . Hyperlipidemia   . Hypertension   . Seasonal allergies     Past Surgical History:  Procedure Laterality Date  . COLONOSCOPY     x2  . HAND SURGERY Left 1983  . KNEE ARTHROSCOPY Left 1986  . MULTIPLE TOOTH EXTRACTIONS    . RENAL ARTERY DOPPLERS  05/29/2017    Normal Renal A Bilaterally.  L > R kidney (3.2 cm) . Renal Cysts noted  - larges ~3.7 cm x 3.5 cm (R kidney), L kidney - ~1.7 cm x 1.6 cm -> recommend referral to Nephrology  . TOTAL KNEE ARTHROPLASTY Left 09/13/2019  . TOTAL KNEE ARTHROPLASTY Left 09/13/2019   Procedure: LEFT TOTAL KNEE ARTHROPLASTY;  Surgeon: Cammy Copa, MD;  Location: [facility] OR;  Service: Orthopedics;  Laterality: Left;  . TRANSTHORACIC ECHOCARDIOGRAM  05/2017   Normal Echo. Mod Conc LVH. EF 55-60%.  No RWMA. Normal Valves. Gr 1 DD.  Marland Kitchen UPPER GI ENDOSCOPY     2x    Current Facility-Administered Medications  Medication Dose Route Frequency Provider Last Rate Last Admin  . ceFAZolin (ANCEF) 3 g in dextrose 5 % 50 mL IVPB  3 g Intravenous On Call to OR Cammy Copa, MD      . chlorhexidine (HIBICLENS) 4 %  liquid 4 application  60 mL Topical Once Magnant, Charles L, PA-C      . chlorhexidine (HIBICLENS) 4 % liquid 4 application  60 mL Topical Once Magnant, Charles L, PA-C      . lactated ringers infusion   Intravenous Continuous Lillia Abed, MD 10 mL/hr at 04/05/20 0851 New Bag at 04/05/20 0851  . povidone-iodine 10 % swab 2 application  2 application Topical Once Magnant, Charles L, PA-C      . tranexamic acid (CYKLOKAPRON) IVPB 1,000 mg  1,000 mg Intravenous To OR Magnant, Charles L, PA-C        Facility-Administered Medications Ordered in Other Encounters  Medication Dose Route Frequency Provider Last Rate Last Admin  . dexamethasone (DECADRON) injection   Infiltration Anesthesia Intra-op Janeece Riggers, MD   10 mg at 04/05/20 0930  . ropivacaine (PF) 7.5 mg/mL (0.75%) (NAROPIN) injection   Peri-NEURAL Anesthesia Intra-op Janeece Riggers, MD   30 mL at 04/05/20 0930   No Known Allergies  Social History   Tobacco Use  . Smoking status: Never Smoker  . Smokeless tobacco: Never Used  Substance Use Topics  . Alcohol use: Yes    Comment: occasional beer/wine    Family History  Problem Relation Age of Onset  . Cancer Mother   . Cancer Father   . Cancer Sister   . Hypertension Sister   . Cancer Brother   . Hypertension Brother   . Cancer Sister   . Hypertension Sister      Review of Systems  Musculoskeletal: Positive for arthralgias.  All other systems reviewed and are negative.   Objective:  Physical Exam  Constitutional: He appears well-developed.  HENT:  Head: Normocephalic.  Eyes: Pupils are equal, round, and reactive to light.  Cardiovascular: Normal rate.  Respiratory: Effort normal.  Musculoskeletal:     Cervical back: Normal range of motion.  Neurological: He is alert.  Skin: Skin is warm.  Psychiatric: He has a normal mood and affect.  Examination of the right knee demonstrates flexion contracture of about 10 degrees he is able to bend past 90.  Collaterals are stable.  Pedal pulses palpable.  No masses lymphadenopathy or skin changes noted in the right knee region.  Vital signs in last 24 hours: Temp:  [98.1 F (36.7 C)] 98.1 F (36.7 C) (04/08 0835) Pulse Rate:  [78] 78 (04/08 0835) Resp:  [18] 18 (04/08 0835) BP: (151-153)/(90-97) 151/97 (04/08 0900) SpO2:  [97 %] 97 % (04/08 0835) Weight:  [123.4 kg] 123.4 kg (04/08 0835)  Labs:   Estimated body mass index is 34 kg/m as calculated from the following:   Height as of this encounter: 6'  3" (1.905 m).   Weight as of this encounter: 123.4 kg.   Imaging Review Plain radiographs demonstrate severe degenerative joint disease of the right knee(s). The overall alignment ismild varus. The bone quality appears to be good for age and reported activity level.      Assessment/Plan:  End stage arthritis, right knee   The patient history, physical examination, clinical judgment of the provider and imaging studies are consistent with end stage degenerative joint disease of the right knee(s) and total knee arthroplasty is deemed medically necessary. The treatment options including medical management, injection therapy arthroscopy and arthroplasty were discussed at length. The risks and benefits of total knee arthroplasty were presented and reviewed. The risks due to aseptic loosening, infection, stiffness, patella tracking problems, thromboembolic complications and other imponderables were discussed. The patient acknowledged  the explanation, agreed to proceed with the plan and consent was signed. Patient is being admitted for inpatient treatment for surgery, pain control, PT, OT, prophylactic antibiotics, VTE prophylaxis, progressive ambulation and ADL's and discharge planning. The patient is planning to be discharged to skilled nursing facility    Anticipated LOS equal to or greater than 2 midnights due to - Age 62 and older with one or more of the following:  - Obesity  - Expected need for hospital services (PT, OT, Nursing) required for safe  discharge  - Anticipated need for postoperative skilled nursing care or inpatient rehab  - Active co-morbidities: None OR   - Unanticipated findings during/Post Surgery: None  - Patient is a high risk of re-admission due to: None

## 2020-04-05 NOTE — Anesthesia Postprocedure Evaluation (Signed)
Anesthesia Post Note  Patient: Kristopher POLIO Sr.  Procedure(s) Performed: RIGHT TOTAL KNEE ARTHROPLASTY (Right Knee)     Patient location during evaluation: Nursing Unit Anesthesia Type: MAC and Spinal Level of consciousness: oriented and awake and alert Pain management: pain level controlled Vital Signs Assessment: post-procedure vital signs reviewed and stable Respiratory status: spontaneous breathing and respiratory function stable Cardiovascular status: blood pressure returned to baseline and stable Postop Assessment: no headache, no backache, no apparent nausea or vomiting and patient able to bend at knees Anesthetic complications: no    Last Vitals:  Vitals:   04/05/20 0900 04/05/20 1457  BP: (!) 151/97 (!) 128/91  Pulse:  77  Resp:  11  Temp:  36.4 C  SpO2:  98%    Last Pain:  Vitals:   04/05/20 1457  TempSrc:   PainSc: [place]

## 2020-04-05 NOTE — Op Note (Signed)
NAME: Kristopher SR., Kristopher [name] ACCOUNT [id number] DATE OF BIRTH:May 30, 1961 FACILITY: [facility] LOCATION: [facility] PHYSICIAN:Amela Handley Randel Pigg, MD  OPERATIVE REPORT  DATE OF PROCEDURE:  04/05/2020  PREOPERATIVE DIAGNOSIS:  Right knee arthritis.  POSTOPERATIVE DIAGNOSIS:  Right knee arthritis.  PROCEDURE:  Right total knee replacement using Stryker components, 5 femur, press-fit posterior cruciate retaining 6 tibia, press-fit 12 mm deep dish posterior cruciate ligament retaining spacer and 35 mm 3-peg press-fit patella.  SURGEON:  Meredith Pel, MD  ASSISTANT:  Annie Main, PA.  INDICATIONS:  The patient is a 59 year old patient with left knee pain.  He presents for operative management after explanation of risks and benefits.  He has had a good result with his left total knee replacement.  He has achieved improved blood glucose  control prior to surgery.  He presents now for operative management after explanation of risks and benefits.  PROCEDURE IN DETAIL:  The patient was brought to the operating room where spinal anesthetic was induced.  Preoperative antibiotics administered.  Timeout was called.  Right leg was prescrubbed with alcohol and Betadine, allowed to air dry, prepped with  DuraPrep solution and draped in a sterile manner.  Ioban used to cover the operative field.  Leg was elevated, exsanguinated with the Esmarch wrap and exsanguinated.  He had about a 10-degree flexion contracture.  Anterior approach to the knee was made.   Skin and subcutaneous tissue were sharply divided.  Median parapatellar approach was made and marked with a #1 Vicryl suture.  Lateral patellofemoral ligament was released.  Fat pad partially excised.  Bony ossicles in the retropatellar tendon area were  removed.  Synovitis was present and was also removed.  Soft tissue from the anterior distal femur was also removed.  The knee was flexed.  Anterior horn of the lateral meniscus was  removed and the ACL was cut.  Severe arthritis was present in all 3  compartments.  Following synovectomy, the intramedullary alignment was used to make a cut, initially 2 mm off the most affected medial tibial plateau.  Next, the cut was revised just to get down to better bone.  Then, an 8 mm cut was initially made off  the femur.  The 9 mm spacer came out to not quite full extension and to get to better bone, 2 more millimeters were taken.  With 10 mm cut on the femur and the 4 mm cut off the most affected medial tibial plateau, the patient had good extension with an  11 mm spacer.  The femur was sized to a size 5.  The tibia was sized to a size 6.  Anterior, posterior and chamfer cuts were made.  The tibia was keel punched.  The patella was then cut down from 26 to 16 mm and a 3-peg trial patella was placed.  The  patient had full extension, full flexion with minimal liftoff, excellent patellar tracking using no thumbs technique.  Final preparation of the tibia was made.  The patient had a cyst in the posterolateral margin of the tibial plateau which was filled  with bone graft.  Thorough irrigation with 3 L of irrigating solution was utilized, then a solution of TXA topical and IrriSept solution was placed.  The IrriSept solution was used after the incision and after the arthrotomy and at multiple times during  the case to diminish infection.  Following this, 3 minutes, the sponges were removed, implants were then tapped into position with excellent press-fit obtained.  Good  alignment was present.  Same stability parameters were maintained with excellent  stability to varus and valgus stress at 0, 30 and 90 degrees.  Excellent patellar tracking using no thumbs technique.  At this time, thorough irrigation was performed again with pouring irrigation.  Tourniquet was released.  Bleeding points encountered  controlled with electrocautery.  Arthrotomy was closed using #1 Vicryl suture, followed by  interrupted inverted 0 Vicryl suture, 2-0 Vicryl suture and a 3-0 Monocryl, followed by Steri-Strips, Aquacel dressing, bulky dressing, and knee immobilizer.  The  patient tolerated the procedure well without immediate complication.  Luke's assistance was required at all times during the case for retraction, opening and closing, limb positioning.  His assistance was a medical necessity.  VN/NUANCE  D:04/05/2020 T:04/05/2020 JOB:[id number]

## 2020-04-05 NOTE — Progress Notes (Signed)
Orthopedic Tech Progress Note Patient Details:  Kristopher [name] Sr. 01-02-1961 [id number]  CPM Right Knee CPM Right Knee: On Right Knee Flexion (Degrees): 40 Right Knee Extension (Degrees): 10 Additional Comments: foot roll  Post Interventions Patient Tolerated: Well Instructions Provided: Care of device  Saul Fordyce 04/05/2020, 3:25 PM

## 2020-04-05 NOTE — Transfer of Care (Signed)
Immediate Anesthesia Transfer of Care Note  Patient: Kristopher NEDDO Sr.  Procedure(s) Performed: RIGHT TOTAL KNEE ARTHROPLASTY (Right Knee)  Patient Location: PACU  Anesthesia Type:Spinal and MAC combined with regional for post-op pain  Level of Consciousness: drowsy  Airway & Oxygen Therapy: Patient Spontanous Breathing and Patient connected to nasal cannula oxygen  Post-op Assessment: Report given to RN and Post -op Vital signs reviewed and stable  Post vital signs: Reviewed and stable  Last Vitals:  Vitals Value Taken Time  BP 128/91 04/05/20 1456  Temp    Pulse 75 04/05/20 1501  Resp 16 04/05/20 1501  SpO2 99 % 04/05/20 1501  Vitals shown include unvalidated device data.  Last Pain:  Vitals:   04/05/20 0846  TempSrc:   PainSc: 6          Complications: No apparent anesthesia complications

## 2020-04-05 NOTE — Addendum Note (Signed)
Addendum  created 04/05/20 1534 by Trevor Iha, MD   Order list changed

## 2020-04-05 NOTE — Progress Notes (Signed)
Pt arrived from PACU;alert & oriented x4; on a CPM machine; vital signs stable.

## 2020-04-05 NOTE — Anesthesia Procedure Notes (Signed)
Anesthesia Regional Block: Adductor canal block   Pre-Anesthetic Checklist: ,, timeout performed, Correct Patient, Correct Site, Correct Laterality, Correct Procedure, Correct Position, site marked, Risks and benefits discussed,  Surgical consent,  Pre-op evaluation,  At surgeon's request and post-op pain management  Laterality: Right  Prep: chloraprep       Needles:  Injection technique: Single-shot  Needle Type: Echogenic Stimulator Needle     Needle Length: 5cm  Needle Gauge: 22     Additional Needles:   Procedures:, nerve stimulator,,, ultrasound used (permanent image in chart),,,,  Narrative:  Start time: 04/05/2020 9:30 AM End time: 04/05/2020 9:35 AM Injection made incrementally with aspirations every 5 mL.  Performed by: Personally  Anesthesiologist: Bethena Midget, MD  Additional Notes: Functioning IV was confirmed and monitors were applied.  A 37mm 22ga Arrow echogenic stimulator needle was used. Sterile prep and drape,hand hygiene and sterile gloves were used. Ultrasound guidance: relevant anatomy identified, needle position confirmed, local anesthetic spread visualized around nerve(s)., vascular puncture avoided.  Image printed for medical record. Negative aspiration and negative test dose prior to incremental administration of local anesthetic. The patient tolerated the procedure well.

## 2020-04-06 ENCOUNTER — Encounter: Payer: Self-pay | Admitting: *Deleted

## 2020-04-06 LAB — GLUCOSE, CAPILLARY
Glucose-Capillary: 154 mg/dL — ABNORMAL HIGH (ref 70–99)
Glucose-Capillary: 135 mg/dL — ABNORMAL HIGH (ref 70–99)
Glucose-Capillary: 123 mg/dL — ABNORMAL HIGH (ref 70–99)

## 2020-04-06 MED ORDER — OXYCODONE HCL 5 MG PO TABS: 5.0000 mg | ORAL_TABLET | ORAL | 0 refills | Status: AC | PRN

## 2020-04-06 MED ORDER — OXYCODONE HCL 5 MG PO TABS: 5.0000 mg | ORAL_TABLET | ORAL | 0 refills | Status: DC | PRN

## 2020-04-06 NOTE — Evaluation (Signed)
Physical Therapy Evaluation Patient Details Name: Kristopher MOGG Sr. MRN: [id number] DOB: 1961-04-24 Today's Date: 04/06/2020   History of Present Illness  Pt is a 59 yo male s/p R TKA by Dr. Marlou Sa 4/8. PMH includes: HLD, HTN, and arthritis of both knees.  Clinical Impression  Pt sitting EOB upon arrival of PT, agreeable to evaluation at this time. Prior to admission the pt was independent with use of RW or cane (depending on pain level) for mobility, and lives in a 1st floor apt with one step to get from the parking lot into his home. The pt now presents with limitations in functional mobility, endurance, and functional strength due to above dx and resulting pain, and will continue to benefit from skilled PT to address these deficits. The pt was able to demo a short ambulation with use of a RW, but will continue to benefit from skilled PT to maximize independence with mobility and for training on stair navigation and exercises.      Follow Up Recommendations Home health PT;Supervision/Assistance - 24 hour    Equipment Recommendations  (pt has needed equipment)    Recommendations for Other Services       Precautions / Restrictions Precautions Precautions: Fall Restrictions Weight Bearing Restrictions: Yes RLE Weight Bearing: Weight bearing as tolerated      Mobility  Bed Mobility               General bed mobility comments: Pt sitting EOB upon arrival of PT  Transfers Overall transfer level: Needs assistance Equipment used: Rolling walker (2 wheeled) Transfers: Sit to/from Stand Sit to Stand: Mod assist         General transfer comment: modA initially, upon second trial pt able to rise with minA from recliner  Ambulation/Gait Ambulation/Gait assistance: Min guard Gait Distance (Feet): 40 Feet Assistive device: Rolling walker (2 wheeled) Gait Pattern/deviations: Step-to pattern;Decreased stance time - right;Decreased weight shift to right;Decreased dorsiflexion  - right;Trunk flexed;Shuffle Gait velocity: 0.11 m/s Gait velocity interpretation: <1.31 ft/sec, indicative of household ambulator General Gait Details: pt with poor wt acceptance to RLE, step-to gait pattern using RW, absent heel-toe pattern  Stairs            Wheelchair Mobility    Modified Rankin (Stroke Patients Only)       Balance Overall balance assessment: Needs assistance Sitting-balance support: No upper extremity supported;Feet supported Sitting balance-Leahy Scale: Good Sitting balance - Comments: mod I   Standing balance support: Bilateral upper extremity supported;During functional activity Standing balance-Leahy Scale: Fair Standing balance comment: pt able to static stand to don mask without UE support, BUE support for ambulation                             Pertinent Vitals/Pain Pain Assessment: 0-10 Pain Score: 8  Pain Location: R knee Pain Descriptors / Indicators: Grimacing;Sore;Aching Pain Intervention(s): RN gave pain meds during session;Limited activity within patient's tolerance;Monitored during session;Utilized relaxation techniques;Repositioned    Home Living Family/patient expects to be discharged to:: Private residence Living Arrangements: Spouse/significant other Available Help at Discharge: Family;Friend(s);Available PRN/intermittently Type of Home: Apartment Home Access: Level entry(1st floor apt, 1 curb from parking lot to door)     Home Layout: One level Home Equipment: Cane - single point;Walker - 2 wheels;Bedside commode;Tub bench;Hand held shower head      Prior Function Level of Independence: Independent with assistive device(s)         Comments:  reports he has been using RW more than cane reccently due to pain     Hand Dominance        Extremity/Trunk Assessment   Upper Extremity Assessment Upper Extremity Assessment: Overall WFL for tasks assessed    Lower Extremity Assessment Lower Extremity  Assessment: Overall WFL for tasks assessed;RLE deficits/detail RLE Deficits / Details: limited due to pain RLE: Unable to fully assess due to pain    Cervical / Trunk Assessment Cervical / Trunk Assessment: Normal  Communication   Communication: No difficulties  Cognition Arousal/Alertness: Awake/alert Behavior During Therapy: WFL for tasks assessed/performed Overall Cognitive Status: Within Functional Limits for tasks assessed                                 General Comments: Pt moves quickly, borderline impulsive, but more eagerness with movement/progression than poor safety awareness      General Comments      Exercises     Assessment/Plan    PT Assessment Patient needs continued PT services  PT Problem List Decreased strength;Decreased mobility;Decreased safety awareness;Decreased range of motion;Decreased activity tolerance;Decreased balance;Decreased coordination;Pain;Decreased knowledge of use of DME       PT Treatment Interventions DME instruction;Therapeutic exercise;Gait training;Balance training;Stair training;Functional mobility training;Therapeutic activities;Patient/family education    PT Goals (Current goals can be found in the Care Plan section)  Acute Rehab PT Goals Patient Stated Goal: return home PT Goal Formulation: With patient Time For Goal Achievement: 04/20/20 Potential to Achieve Goals: Good    Frequency 7X/week   Barriers to discharge        Co-evaluation               AM-PAC PT "6 Clicks" Mobility  Outcome Measure Help needed turning from your back to your side while in a flat bed without using bedrails?: A Little Help needed moving from lying on your back to sitting on the side of a flat bed without using bedrails?: A Little Help needed moving to and from a bed to a chair (including a wheelchair)?: A Little Help needed standing up from a chair using your arms (e.g., wheelchair or bedside chair)?: A Little Help needed  to walk in hospital room?: A Little Help needed climbing 3-5 steps with a railing? : A Lot 6 Click Score: 17    End of Session Equipment Utilized During Treatment: Gait belt Activity Tolerance: Patient tolerated treatment well Patient left: in chair;with call bell/phone within reach;with chair alarm set Nurse Communication: Mobility status PT Visit Diagnosis: Difficulty in walking, not elsewhere classified (R26.2);Pain Pain - Right/Left: Right Pain - part of body: Knee    Time: 6433-2951 PT Time Calculation (min) (ACUTE ONLY): 30 min   Charges:   PT Evaluation $PT Eval Moderate Complexity: 1 Mod PT Treatments $Gait Training: 8-22 mins        Rolm Baptise, PT, DPT   Acute Rehabilitation Department Pager #: [phone]  Gaetana Michaelis 04/06/2020, 12:58 PM

## 2020-04-06 NOTE — Plan of Care (Signed)
?  Problem: Clinical Measurements: ?Goal: Will remain free from infection ?Outcome: Progressing ?  ?

## 2020-04-06 NOTE — Plan of Care (Signed)
  Problem: Education: Goal: Knowledge of General Education information will improve Description: Including pain rating scale, medication(s)/side effects and non-pharmacologic comfort measures Outcome: Progressing   Problem: Health Behavior/Discharge Planning: Goal: Ability to manage health-related needs will improve Outcome: Progressing   Problem: Clinical Measurements: Goal: Will remain free from infection Outcome: Progressing   Problem: Elimination: Goal: Will not experience complications related to bowel motility Outcome: Progressing   Problem: Pain Managment: Goal: General experience of comfort will improve Outcome: Progressing   Problem: Safety: Goal: Ability to remain free from injury will improve Outcome: Progressing   Problem: Skin Integrity: Goal: Risk for impaired skin integrity will decrease Outcome: Progressing   

## 2020-04-06 NOTE — Progress Notes (Signed)
  Subjective: Kristopher DOOLIN Sr. is a 59 y.o. male s/p right TKA.  They are POD1.  Pt's pain is controlled.  Pt denies numbness/tingling/weakness.  Pt has ambulated with some difficulty in PT.  He lives in a 1st floor apartment and does not have any stairs in his home.  Requests discharge home today. Has CPM machine at home.     Objective: Vital signs in last 24 hours: Temp:  [97.4 F (36.3 C)-97.9 F (36.6 C)] 97.8 F (36.6 C) (04/09 1306) Pulse Rate:  [63-90] 86 (04/09 1306) Resp:  [11-18] 17 (04/09 1306) BP: (104-160)/(63-103) 155/94 (04/09 1306) SpO2:  [94 %-99 %] 97 % (04/09 1306)  Intake/Output from previous day: 04/08 0701 - 04/09 0700 In: 1250 [I.V.:1200; IV Piggyback:50] Out: 550 [Urine:450; Blood:100] Intake/Output this shift: Total I/O In: 240 [P.O.:240] Out: 400 [Urine:400]  Exam:  No gross blood or drainage overlying the dressing 2+ DP pulse Sensation intact distally in the right foot Able to dorsiflex and plantarflex the right foot   Labs: No results for input(s): HGB in the last 72 hours. No results for input(s): WBC, RBC, HCT, PLT in the last 72 hours. No results for input(s): NA, K, CL, CO2, BUN, CREATININE, GLUCOSE, CALCIUM in the last 72 hours. No results for input(s): LABPT, INR in the last 72 hours.  Assessment/Plan: Pt is POD1 s/p right TKA.    -Plan to discharge to home today or tomorrow pending patient's pain and PT eval  -WBAT with a walker  -Okay to shower, dressing is waterproof.  Cautioned patient against soaking dressing in bath/pool/body of water  -Encouraged the use of the blue cradle boot to work on extension.  Cautioned patient against using a pillow under their knee.  -Use the CPM machine at least 3 times per day for one hour each time, increasing the degrees daily.     Kristopher [name] 04/06/2020, 2:07 PM

## 2020-04-06 NOTE — Plan of Care (Signed)
Pt discharging to home with [facility]. Pt states he already has [facility] set-up. Discharge instructions explained to pt, pt verbalized understanding. All personal belongings returned to pt. No questions or concerns voiced at this time. Awaiting transportation.   Problem: Education: Goal: Knowledge of General Education information will improve Description: Including pain rating scale, medication(s)/side effects and non-pharmacologic comfort measures 04/06/2020 1639 by Stevan Born, RN Outcome: Completed/Met 04/06/2020 1320 by Stevan Born, RN Outcome: Progressing   Problem: Health Behavior/Discharge Planning: Goal: Ability to manage health-related needs will improve 04/06/2020 1639 by Stevan Born, RN Outcome: Completed/Met 04/06/2020 1320 by Stevan Born, RN Outcome: Progressing   Problem: Clinical Measurements: Goal: Ability to maintain clinical measurements within normal limits will improve Outcome: Completed/Met Goal: Will remain free from infection 04/06/2020 1639 by Stevan Born, RN Outcome: Completed/Met 04/06/2020 1320 by Stevan Born, RN Outcome: Progressing Goal: Diagnostic test results will improve Outcome: Completed/Met Goal: Respiratory complications will improve Outcome: Completed/Met Goal: Cardiovascular complication will be avoided Outcome: Completed/Met   Problem: Activity: Goal: Risk for activity intolerance will decrease Outcome: Completed/Met   Problem: Nutrition: Goal: Adequate nutrition will be maintained Outcome: Completed/Met   Problem: Coping: Goal: Level of anxiety will decrease Outcome: Completed/Met   Problem: Elimination: Goal: Will not experience complications related to bowel motility 04/06/2020 1639 by Stevan Born, RN Outcome: Completed/Met 04/06/2020 1320 by Stevan Born, RN Outcome: Progressing Goal: Will not experience complications related to urinary retention Outcome: Completed/Met   Problem: Pain Managment: Goal: General  experience of comfort will improve 04/06/2020 1639 by Stevan Born, RN Outcome: Completed/Met 04/06/2020 1320 by Stevan Born, RN Outcome: Progressing   Problem: Safety: Goal: Ability to remain free from injury will improve 04/06/2020 1639 by Stevan Born, RN Outcome: Completed/Met 04/06/2020 1320 by Stevan Born, RN Outcome: Progressing   Problem: Skin Integrity: Goal: Risk for impaired skin integrity will decrease 04/06/2020 1639 by Stevan Born, RN Outcome: Completed/Met 04/06/2020 1320 by Stevan Born, RN Outcome: Progressing

## 2020-04-06 NOTE — Progress Notes (Signed)
  Subjective: Patient stable.  Pain relatively well controlled but he has not been out of bed yet.   Objective: Vital signs in last 24 hours: Temp:  [97.4 F (36.3 C)-98.1 F (36.7 C)] 97.9 F (36.6 C) (04/09 0653) Pulse Rate:  [63-83] 83 (04/09 0653) Resp:  [11-18] 16 (04/09 0653) BP: (104-153)/(63-100) 140/94 (04/09 0653) SpO2:  [94 %-99 %] 98 % (04/09 0653) Weight:  [123.4 kg] 123.4 kg (04/08 0835)  Intake/Output from previous day: 04/08 0701 - 04/09 0700 In: 1250 [I.V.:1200; IV Piggyback:50] Out: 550 [Urine:450; Blood:100] Intake/Output this shift: No intake/output data recorded.  Exam:  Dorsiflexion/Plantar flexion intact Compartment soft  Labs: No results for input(s): HGB in the last 72 hours. No results for input(s): WBC, RBC, HCT, PLT in the last 72 hours. No results for input(s): NA, K, CL, CO2, BUN, CREATININE, GLUCOSE, CALCIUM in the last 72 hours. No results for input(s): LABPT, INR in the last 72 hours.  Assessment/Plan: Plan at this time is physical therapy this morning.  Kristopher [name] will assess him midday.  If he looks like he is doing well then we could potentially have him go home by 3 or 4:00 today after second physical therapy session.  His desire is to go home today.  He will need Percocet 09/30/2024 which is what he used last time along with aspirin and muscle relaxer.  He may have some muscle relaxer and aspirin left from his last total knee replacement.   G Scott Dara Beidleman 04/06/2020, 8:20 AM

## 2020-04-06 NOTE — Progress Notes (Signed)
Physical Therapy Treatment Patient Details Name: Kristopher TAILOR Sr. MRN: [id number] DOB: 1961/06/22 Today's Date: 04/06/2020    History of Present Illness Pt is a 59 yo male s/p R TKA by Dr. August Saucer 4/8. PMH includes: HLD, HTN, and arthritis of both knees.    PT Comments    Pt OOB in recliner upon arrival of PT, agreeable to PT session with focus on HEP exercises and home management techniques in anticipation of possible d/c home. The pt was able to complete multiple LE exercises for ROM and general strengthening, but continues to benefit from [facility] for progressing knee flexion. The pt was educated in multiple ways to actively progress functional ROM and how his girlfriend can assist/facilitate the movement. The pt was also instructed in breathing techniques to further maximize ROM and activity with exercises. The pt will continue to benefit from skilled PT to progress functional independence and activity tolerance.     Follow Up Recommendations  Home health PT;Supervision/Assistance - 24 hour     Equipment Recommendations  None recommended by PT(pt has needed equipment at home)    Recommendations for Other Services       Precautions / Restrictions Precautions Precautions: Fall Restrictions Weight Bearing Restrictions: Yes RLE Weight Bearing: Weight bearing as tolerated    Mobility  Bed Mobility Overal bed mobility: Needs Assistance Bed Mobility: Sit to Supine       Sit to supine: Min assist   General bed mobility comments: minA for RLE into bed  Transfers Overall transfer level: Needs assistance Equipment used: Rolling walker (2 wheeled) Transfers: Sit to/from Stand Sit to Stand: Min guard         General transfer comment: minG with VCs for hand placement and extra time to power up. no assist needed. completed x6 through session  Ambulation/Gait Ambulation/Gait assistance: Min guard Gait Distance (Feet): 10 Feet Assistive device: Rolling walker (2  wheeled) Gait Pattern/deviations: Step-to pattern;Decreased stance time - right;Decreased weight shift to right;Decreased dorsiflexion - right;Trunk flexed;Shuffle Gait velocity: 0.11 m/s Gait velocity interpretation: <1.31 ft/sec, indicative of household ambulator General Gait Details: pt with poor wt acceptance to RLE, step-to gait pattern using RW, absent heel-toe pattern   Stairs Stairs: (pt educated on stair training, verablized understanding of how to navigate 1 curb)           Wheelchair Mobility    Modified Rankin (Stroke Patients Only)       Balance Overall balance assessment: Needs assistance Sitting-balance support: No upper extremity supported;Feet supported Sitting balance-Leahy Scale: Good Sitting balance - Comments: mod I   Standing balance support: Bilateral upper extremity supported;During functional activity Standing balance-Leahy Scale: Fair Standing balance comment: pt able to static stand to don mask without UE support, BUE support for ambulation                            Cognition Arousal/Alertness: Awake/alert Behavior During Therapy: WFL for tasks assessed/performed Overall Cognitive Status: Within Functional Limits for tasks assessed                                 General Comments: Pt verbalized understanding to education on safety and exercises      Exercises Total Joint Exercises Quad Sets: AROM;Both;10 reps;Supine Heel Slides: AAROM;Right;10 reps;Supine Hip ABduction/ADduction: AAROM;Right;10 reps;Supine Knee Flexion: AROM;AAROM;Right;10 reps;Seated    General Comments        Pertinent  Vitals/Pain Pain Assessment: 0-10 Pain Score: 3  Pain Location: R knee Pain Descriptors / Indicators: Grimacing;Sore;Aching Pain Intervention(s): Limited activity within patient's tolerance;Monitored during session;Repositioned    Home Living Family/patient expects to be discharged to:: Private residence Living  Arrangements: Spouse/significant other Available Help at Discharge: Family;Friend(s);Available PRN/intermittently Type of Home: Apartment Home Access: Level entry(1st floor apt, 1 curb from parking lot to door)   Home Layout: One level Home Equipment: Cane - single point;Walker - 2 wheels;Bedside commode;Tub bench;Hand held shower head      Prior Function Level of Independence: Independent with assistive device(s)      Comments: reports he has been using RW more than cane reccently due to pain   PT Goals (current goals can now be found in the care plan section) Acute Rehab PT Goals Patient Stated Goal: return home PT Goal Formulation: With patient Time For Goal Achievement: 04/20/20 Potential to Achieve Goals: Good Progress towards PT goals: Progressing toward goals    Frequency    7X/week      PT Plan      Co-evaluation              AM-PAC PT "6 Clicks" Mobility   Outcome Measure  Help needed turning from your back to your side while in a flat bed without using bedrails?: A Little Help needed moving from lying on your back to sitting on the side of a flat bed without using bedrails?: A Little Help needed moving to and from a bed to a chair (including a wheelchair)?: A Little Help needed standing up from a chair using your arms (e.g., wheelchair or bedside chair)?: A Little Help needed to walk in hospital room?: A Little Help needed climbing 3-5 steps with a railing? : A Little 6 Click Score: 18    End of Session Equipment Utilized During Treatment: Gait belt Activity Tolerance: Patient tolerated treatment well Patient left: in bed;with call bell/phone within reach;with bed alarm set Nurse Communication: Mobility status PT Visit Diagnosis: Difficulty in walking, not elsewhere classified (R26.2);Pain Pain - Right/Left: Right Pain - part of body: Knee     Time: 1359-1445 PT Time Calculation (min) (ACUTE ONLY): 46 min  Charges:  $Gait Training: 8-22  mins $Therapeutic Exercise: 23-37 mins $Self Care/Home Management: 8-22                     Karma Ganja, PT, DPT   Acute Rehabilitation Department Pager #: [phone]   Otho Bellows 04/06/2020, 4:16 PM

## 2020-04-10 ENCOUNTER — Telehealth: Payer: Self-pay | Admitting: Orthopedic Surgery

## 2020-04-10 NOTE — Telephone Encounter (Signed)
I have sent message to Dorene Sorrow with [facility] to ask him about this

## 2020-04-10 NOTE — Telephone Encounter (Signed)
Patient called.   He is a post op patient and was calling to inform us that his home PT has yet to be started  Call back: [phone]

## 2020-04-10 NOTE — Telephone Encounter (Signed)
I talked with Kristopher [name] They were waiting on [place] auth for therapy which they just received today. They will be seeing patient tomorrow. Patient made aware

## 2020-04-11 NOTE — Discharge Summary (Signed)
Physician Discharge Summary      Patient ID: Kristopher [name] Sr. MRN: [id number] DOB/AGE: May 22, 1961 59 y.o.  Admit date: 04/05/2020 Discharge date: 04/06/2020  Admission Diagnoses:  Active Problems:   Arthritis of right knee   Discharge Diagnoses:  Same  Surgeries: Procedure(s): RIGHT TOTAL KNEE ARTHROPLASTY on 04/05/2020   Consultants:   Discharged Condition: Stable  Hospital Course: Kristopher Voorhies. is an 59 y.o. male who was admitted 04/05/2020 with a chief complaint of right knee pain, and found to have a diagnosis of right knee arthritis.  They were brought to the operating room on 04/05/2020 and underwent the above named procedures.  Pt awoke from anesthesia without complication and was transferred to the floor. On POD1, patient's pain was well controlled and he excelled in physical therapy sessions.  He requested discharge home.  He was subsequently discharged home on postop day 1..  Pt will f/u with Dr. August Saucer in clinic in ~2 weeks.   Antibiotics given:  Anti-infectives (From admission, onward)   Start     Dose/Rate Route Frequency Ordered Stop   04/05/20 1930  ceFAZolin (ANCEF) IVPB 2g/100 mL premix     2 g 200 mL/hr over 30 Minutes Intravenous Every 6 hours 04/05/20 1847 04/06/20 0233   04/05/20 0600  ceFAZolin (ANCEF) 3 g in dextrose 5 % 50 mL IVPB     3 g 100 mL/hr over 30 Minutes Intravenous On call to O.R. 04/04/20 0734 04/05/20 1135   04/04/20 0745  ceFAZolin (ANCEF) 3 g in dextrose 5 % 50 mL IVPB  Status:  Discontinued     3 g 100 mL/hr over 30 Minutes Intravenous On call to O.R. 04/04/20 9528 04/04/20 4132    .  Recent vital signs:  Vitals:   04/06/20 1306 04/06/20 1611  BP: (!) 155/94   Pulse: 86 87  Resp: 17   Temp: 97.8 F (36.6 C)   SpO2: 97% 98%    Recent laboratory studies:  Results for orders placed or performed during the hospital encounter of 04/05/20  Glucose, capillary  Result Value Ref Range   Glucose-Capillary 117 (H) 70 - 99 mg/dL    Glucose, capillary  Result Value Ref Range   Glucose-Capillary 116 (H) 70 - 99 mg/dL   Comment 1 Notify RN   Glucose, capillary  Result Value Ref Range   Glucose-Capillary 148 (H) 70 - 99 mg/dL   Comment 1 Notify RN    Comment 2 Document in Chart   Glucose, capillary  Result Value Ref Range   Glucose-Capillary 251 (H) 70 - 99 mg/dL  Glucose, capillary  Result Value Ref Range   Glucose-Capillary 154 (H) 70 - 99 mg/dL  Glucose, capillary  Result Value Ref Range   Glucose-Capillary 135 (H) 70 - 99 mg/dL  Glucose, capillary  Result Value Ref Range   Glucose-Capillary 123 (H) 70 - 99 mg/dL    Discharge Medications:   Allergies as of 04/06/2020   No Known Allergies     Medication List    STOP taking these medications   docusate sodium 100 MG capsule Commonly known as: COLACE   HYDROcodone-acetaminophen 10-325 MG tablet Commonly known as: Norco     TAKE these medications   allopurinol 100 MG tablet Commonly known as: ZYLOPRIM Take 100 mg by mouth 2 (two) times daily.   aspirin 81 MG chewable tablet Commonly known as: [facility] Aspirin Adult Low Dose Chew 1 tablet (81 mg total) by mouth daily.   atorvastatin 10 MG  tablet Commonly known as: LIPITOR Take 10 mg by mouth daily.   Bystolic 20 MG Tabs Generic drug: Nebivolol HCl TAKE 1 TABLET BY MOUTH EVERY DAY What changed: how much to take   carvedilol 25 MG tablet Commonly known as: COREG Take 12.5 mg by mouth 2 (two) times daily with a meal.   chlorthalidone 25 MG tablet Commonly known as: HYGROTON Take 25 mg by mouth daily.   glipiZIDE 5 MG tablet Commonly known as: GLUCOTROL Take 5 mg by mouth daily before breakfast.   Klor-Con M10 10 MEQ tablet Generic drug: potassium chloride Take 10 mEq by mouth daily.   losartan 25 MG tablet Commonly known as: COZAAR TAKE 1 TABLET BY MOUTH EVERY DAY   metFORMIN 1000 MG tablet Commonly known as: GLUCOPHAGE Take 1,000 mg by mouth 2 (two) times daily with a meal.    methocarbamol 500 MG tablet Commonly known as: Robaxin Take 1 tablet (500 mg total) by mouth every 8 (eight) hours as needed for muscle spasms.   oxyCODONE 5 MG immediate release tablet Commonly known as: Oxy IR/ROXICODONE Take 1-2 tablets (5-10 mg total) by mouth every 4 (four) hours as needed for moderate pain (pain score 4-6).   spironolactone 50 MG tablet Commonly known as: ALDACTONE Take 1 tablet (50 mg total) by mouth daily. Please schedule annual appt with Dr. Ellyn Hack for refills. [phone]. 1st attempt.       Diagnostic Studies: No results found.  Disposition: Discharge disposition: 01-Home or Self Care       Discharge Instructions    Call MD / Call 911   Complete by: As directed    If you experience chest pain or shortness of breath, CALL 911 and be transported to the hospital emergency room.  If you develope a fever above 101 F, pus (white drainage) or increased drainage or redness at the wound, or calf pain, call your surgeon's office.   Constipation Prevention   Complete by: As directed    Drink plenty of fluids.  Prune juice may be helpful.  You may use a stool softener, such as Colace (over the counter) 100 mg twice a day.  Use MiraLax (over the counter) for constipation as needed.   Diet - low sodium heart healthy   Complete by: As directed    Discharge instructions   Complete by: As directed    You may shower, dressing is waterproof.  Do not remove the dressing, we will remove it at your first post-op appointment.  Do not take a bath or soak the knee in a tub or pool.  You may weightbear as you can tolerate on the operative leg with a walker.  Continue using the CPM machine 3 times per day for one hour each time, increasing the degrees of range of motion daily.  Use the blue cradle boot under your heel to work on getting your leg straight.  Do NOT put a pillow under your knee.  You will follow-up with Dr. Marlou Sa in the clinic in ~2 weeks at your given appointment  date.   Increase activity slowly as tolerated   Complete by: As directed       Follow-up Information    [facility] Follow up.   Specialty: Home Health Services Why: Home health services arranged ( PT) Contact information: [place] [address] [phone]            Signed: Donella Stade 04/11/2020, 1:19 PM

## 2020-04-19 ENCOUNTER — Ambulatory Visit (INDEPENDENT_AMBULATORY_CARE_PROVIDER_SITE_OTHER): Payer: No Typology Code available for payment source

## 2020-04-19 ENCOUNTER — Telehealth: Payer: Self-pay | Admitting: Orthopedic Surgery

## 2020-04-19 ENCOUNTER — Ambulatory Visit (INDEPENDENT_AMBULATORY_CARE_PROVIDER_SITE_OTHER): Payer: No Typology Code available for payment source | Admitting: Orthopedic Surgery

## 2020-04-19 ENCOUNTER — Encounter: Payer: Self-pay | Admitting: Orthopedic Surgery

## 2020-04-19 ENCOUNTER — Other Ambulatory Visit: Payer: Self-pay

## 2020-04-19 DIAGNOSIS — Z96651 Presence of right artificial knee joint: Secondary | ICD-10-CM

## 2020-04-19 NOTE — Telephone Encounter (Signed)
Patient called.   Informing us that his PT facility is not able to get him started because they are still waiting on [place] authorization.  Call back: [phone]

## 2020-04-19 NOTE — Telephone Encounter (Signed)
Ok pls tell him to spend as much time as possible on cpm 4 - 5 hrs per day thx

## 2020-04-19 NOTE — Telephone Encounter (Signed)
I s/w patient Advised per Dr Redmond Baseman PT to see patient on 04/25/20 per patients report.

## 2020-04-20 ENCOUNTER — Ambulatory Visit (HOSPITAL_COMMUNITY)
Admission: RE | Admit: 2020-04-20 | Discharge: 2020-04-20 | Disposition: A | Discharge status: Home / Self Care | Payer: No Typology Code available for payment source | Source: Ambulatory Visit | Attending: Surgical | Admitting: Surgical

## 2020-04-20 DIAGNOSIS — Z96651 Presence of right artificial knee joint: Secondary | ICD-10-CM | POA: Diagnosis present

## 2020-04-20 NOTE — Progress Notes (Signed)
Post-Op Visit Note   Patient: Kristopher SACHS Sr.           Date of Birth: [birth year]           MRN: [id number] Visit Date: 04/19/2020 PCP: Daylene Katayama, PA   Assessment & Plan:  Chief Complaint:  Chief Complaint  Patient presents with  . Right Knee - Follow-up   Visit Diagnoses:  1. Status post total right knee replacement     Plan: Patient is a 59 year old male who presents s/p right total knee arthroplasty on 04/05/2020.  He is doing well overall and ambulating with a walker.  He is using his CPM machine as instructed.  He has been getting home health physical therapy.  He is compliant with taking aspirin.  On exam he has 5 degrees of extension and 85 degrees of flexion.  He does have some calf tenderness on exam.   he wants to return to work by August 2021.  Radiographs of the right knee show a intact right knee prosthesis in good position and alignment with no complicating features. Ordered right lower extremity ultrasound to rule out DVT. Patient will start outpatient physical therapy at [facility] physical therapy. Follow-up in 4 weeks.  Follow-Up Instructions: No follow-ups on file.   Orders:  Orders Placed This Encounter  Procedures  . XR Knee 1-2 Views Right  . VAS [place] LOWER EXTREMITY VENOUS (DVT)   No orders of the defined types were placed in this encounter.   Imaging: No results found.  PMFS History: Patient Active Problem List   Diagnosis Date Noted  . Arthritis of right knee 04/05/2020  . Arthritis of left knee 09/13/2019  . Pain and swelling of ankle, right 12/09/2017  . Hyperlipidemia with target LDL less than 100 06/18/2017  . Hypertensive heart disease without heart failure 06/18/2017  . Bilateral renal cysts 06/18/2017  . Resistant hypertension 05/18/2017  . H/O hypokalemia 05/18/2017  . Framingham cardiac risk 10-20% in next 10 years 03/17/2017  . Diverticulosis of large intestine without hemorrhage 09/09/2016  . Benign essential  hypertension 07/15/2016  . Acute bilateral ankle pain 09/04/2015  . Closed nondisplaced fracture of navicular bone of left foot 09/04/2015  . Pain in both feet 09/04/2015  . Primary osteoarthritis of both knees 05/30/2015  . Arthralgia of both knees 01/05/2015  . Arthralgia of left hip 01/05/2015  . Abnormal EKG 06/18/2014  . Knee pain 03/28/2014   Past Medical History:  Diagnosis Date  . Ambulates with cane   . Arthritis   . CKD (chronic kidney disease)    stage III (nephrologist: Terrial Rhodes, MD) 03/2019  . Diabetes mellitus without complication (HCC)    Type 2  . GERD (gastroesophageal reflux disease)   . Hyperlipidemia   . Hypertension   . Seasonal allergies     Family History  Problem Relation Age of Onset  . Cancer Mother   . Cancer Father   . Cancer Sister   . Hypertension Sister   . Cancer Brother   . Hypertension Brother   . Cancer Sister   . Hypertension Sister     Past Surgical History:  Procedure Laterality Date  . COLONOSCOPY     x2  . HAND SURGERY Left 1983  . KNEE ARTHROSCOPY Left 1986  . MULTIPLE TOOTH EXTRACTIONS    . RENAL ARTERY DOPPLERS  05/29/2017    Normal Renal A Bilaterally.  L > R kidney (3.2 cm) . Renal Cysts noted  - larges ~3.7 cm  x 3.5 cm (R kidney), L kidney - ~1.7 cm x 1.6 cm -> recommend referral to Nephrology  . TOTAL KNEE ARTHROPLASTY Left 09/13/2019  . TOTAL KNEE ARTHROPLASTY Left 09/13/2019   Procedure: LEFT TOTAL KNEE ARTHROPLASTY;  Surgeon: Meredith Pel, MD;  Location: [place] [facility];  Service: Orthopedics;  Laterality: Left;  . TOTAL KNEE ARTHROPLASTY Right 04/05/2020   Procedure: RIGHT TOTAL KNEE ARTHROPLASTY;  Surgeon: Meredith Pel, MD;  Location: [place];  Service: Orthopedics;  Laterality: Right;  . TRANSTHORACIC ECHOCARDIOGRAM  05/2017   Normal Echo. Mod Conc LVH. EF 55-60%.  No RWMA. Normal Valves. Gr 1 DD.  Marland Kitchen UPPER GI ENDOSCOPY     2x   Social History   Occupational History  . Not on file  Tobacco Use  .  Smoking status: Never Smoker  . Smokeless tobacco: Never Used  Substance and Sexual Activity  . Alcohol use: Yes    Comment: occasional beer/wine  . Drug use: No  . Sexual activity: Not on file

## 2020-04-20 NOTE — Progress Notes (Signed)
Lower extremity venous has been completed.   Preliminary results in CV Proc.   Blanch Media 04/20/2020 1:08 PM

## 2020-04-21 ENCOUNTER — Encounter: Payer: Self-pay | Admitting: Orthopedic Surgery

## 2020-04-21 ENCOUNTER — Other Ambulatory Visit: Payer: Self-pay | Admitting: Surgical

## 2020-04-24 ENCOUNTER — Telehealth: Payer: Self-pay | Admitting: Orthopedic Surgery

## 2020-04-24 NOTE — Telephone Encounter (Signed)
Patient states he's waiting to get set up with physical therapy. He thought he was to go to [facility] bit when he called they did not have an order. Pt request a call back @ [phone]

## 2020-04-24 NOTE — Telephone Encounter (Signed)
Tried calling patient to discuss. No answer. Per our conversation previously he had advised me he would be starting therapy on 04/25/20.  No there is confusion on this. Need to discuss further with patient for more clarification.

## 2020-05-09 ENCOUNTER — Telehealth: Payer: Self-pay | Admitting: Orthopedic Surgery

## 2020-05-09 NOTE — Telephone Encounter (Signed)
Ok for this note patient is requesting?

## 2020-05-09 NOTE — Telephone Encounter (Signed)
Pt called in stating he needs a note stating he's been out of work since 08/2019 due to his knees giving him problems. Pt states when the note is ready he'd like a call so he can come pick it up.   [phone]

## 2020-05-09 NOTE — Telephone Encounter (Signed)
IC advised could pick up tomorrow  

## 2020-05-09 NOTE — Telephone Encounter (Signed)
Yeah sounds good

## 2020-05-20 ENCOUNTER — Other Ambulatory Visit: Payer: Self-pay | Admitting: Surgical

## 2020-05-21 NOTE — Telephone Encounter (Signed)
Please advise 

## 2020-06-21 ENCOUNTER — Ambulatory Visit (INDEPENDENT_AMBULATORY_CARE_PROVIDER_SITE_OTHER): Payer: No Typology Code available for payment source | Admitting: Orthopedic Surgery

## 2020-06-21 ENCOUNTER — Encounter: Payer: Self-pay | Admitting: Orthopedic Surgery

## 2020-06-21 DIAGNOSIS — Z96651 Presence of right artificial knee joint: Secondary | ICD-10-CM

## 2020-06-21 NOTE — Progress Notes (Signed)
Post-Op Visit Note   Patient: Kristopher HANNERS Sr.           Date of Birth: [birth year]           MRN: [id number] Visit Date: 06/21/2020 PCP: Daylene Katayama, PA   Assessment & Plan:  Chief Complaint:  Chief Complaint  Patient presents with  . Right Knee - Routine Post Op   Visit Diagnoses:  1. Status post total right knee replacement     Plan: Kristopher [name] is now about 2 and half months out right total knee replacement.  Doing well.  Finished physical therapy because his visit drain out.  He is making progress but still has some strength deficits.  On exam he has range of motion 5-1 10.  Good stability to varus valgus stress at 0 and 30 degrees.  Patella tracks well.  Mild effusion is present.  Incision looks good.  Plan is to continue with home exercise program and aim for return to work in early to mid August.  He does have to do a lot of climbing on ladders and needs a little bit more strength before returning to work.  We will see him back then.  He is not taking pain medicine currently.  Follow-Up Instructions: No follow-ups on file.   Orders:  No orders of the defined types were placed in this encounter.  No orders of the defined types were placed in this encounter.   Imaging: No results found.  PMFS History: Patient Active Problem List   Diagnosis Date Noted  . Arthritis of right knee 04/05/2020  . Arthritis of left knee 09/13/2019  . Pain and swelling of ankle, right 12/09/2017  . Hyperlipidemia with target LDL less than 100 06/18/2017  . Hypertensive heart disease without heart failure 06/18/2017  . Bilateral renal cysts 06/18/2017  . Resistant hypertension 05/18/2017  . H/O hypokalemia 05/18/2017  . Framingham cardiac risk 10-20% in next 10 years 03/17/2017  . Diverticulosis of large intestine without hemorrhage 09/09/2016  . Benign essential hypertension 07/15/2016  . Acute bilateral ankle pain 09/04/2015  . Closed nondisplaced fracture of navicular bone of  left foot 09/04/2015  . Pain in both feet 09/04/2015  . Primary osteoarthritis of both knees 05/30/2015  . Arthralgia of both knees 01/05/2015  . Arthralgia of left hip 01/05/2015  . Abnormal EKG 06/18/2014  . Knee pain 03/28/2014   Past Medical History:  Diagnosis Date  . Ambulates with cane   . Arthritis   . CKD (chronic kidney disease)    stage III (nephrologist: Terrial Rhodes, MD) 03/2019  . Diabetes mellitus without complication (HCC)    Type 2  . GERD (gastroesophageal reflux disease)   . Hyperlipidemia   . Hypertension   . Seasonal allergies     Family History  Problem Relation Age of Onset  . Cancer Mother   . Cancer Father   . Cancer Sister   . Hypertension Sister   . Cancer Brother   . Hypertension Brother   . Cancer Sister   . Hypertension Sister     Past Surgical History:  Procedure Laterality Date  . COLONOSCOPY     x2  . HAND SURGERY Left 1983  . KNEE ARTHROSCOPY Left 1986  . MULTIPLE TOOTH EXTRACTIONS    . RENAL ARTERY DOPPLERS  05/29/2017    Normal Renal A Bilaterally.  L > R kidney (3.2 cm) . Renal Cysts noted  - larges ~3.7 cm x 3.5 cm (R kidney), L kidney - ~  1.7 cm x 1.6 cm -> recommend referral to Nephrology  . TOTAL KNEE ARTHROPLASTY Left 09/13/2019  . TOTAL KNEE ARTHROPLASTY Left 09/13/2019   Procedure: LEFT TOTAL KNEE ARTHROPLASTY;  Surgeon: Meredith Pel, MD;  Location: [place];  Service: Orthopedics;  Laterality: Left;  . TOTAL KNEE ARTHROPLASTY Right 04/05/2020   Procedure: RIGHT TOTAL KNEE ARTHROPLASTY;  Surgeon: Meredith Pel, MD;  Location: [place];  Service: Orthopedics;  Laterality: Right;  . TRANSTHORACIC ECHOCARDIOGRAM  05/2017   Normal Echo. Mod Conc LVH. EF 55-60%.  No RWMA. Normal Valves. Gr 1 DD.  Marland Kitchen UPPER GI ENDOSCOPY     2x   Social History   Occupational History  . Not on file  Tobacco Use  . Smoking status: Never Smoker  . Smokeless tobacco: Never Used  Vaping Use  . Vaping Use: Never used  Substance and  Sexual Activity  . Alcohol use: Yes    Comment: occasional beer/wine  . Drug use: No  . Sexual activity: Not on file

## 2020-06-26 ENCOUNTER — Other Ambulatory Visit: Payer: Self-pay | Admitting: Surgical

## 2020-06-26 NOTE — Telephone Encounter (Signed)
Ok to rf? 

## 2020-07-18 ENCOUNTER — Ambulatory Visit: Payer: No Typology Code available for payment source | Admitting: Orthopedic Surgery

## 2020-07-19 ENCOUNTER — Other Ambulatory Visit: Payer: Self-pay | Admitting: Surgical

## 2020-07-19 NOTE — Telephone Encounter (Signed)
Pls advise.  

## 2020-07-19 NOTE — Telephone Encounter (Signed)
Gd pt 

## 2020-08-02 ENCOUNTER — Other Ambulatory Visit: Payer: Self-pay | Admitting: Surgical

## 2020-08-06 ENCOUNTER — Ambulatory Visit (INDEPENDENT_AMBULATORY_CARE_PROVIDER_SITE_OTHER): Payer: No Typology Code available for payment source | Admitting: Orthopedic Surgery

## 2020-08-06 DIAGNOSIS — Z96651 Presence of right artificial knee joint: Secondary | ICD-10-CM

## 2020-08-08 ENCOUNTER — Encounter: Payer: Self-pay | Admitting: Orthopedic Surgery

## 2020-08-08 NOTE — Progress Notes (Signed)
Office Visit Note   Patient: Kristopher ASTON Sr.           Date of Birth: 11-09-1961           MRN: [id number] Visit Date: 08/06/2020 Requested by: Daylene Katayama, PA [address] [place],  [place] [address] PCP: Daylene Katayama, PA  Subjective: Chief Complaint  Patient presents with  . Right Knee - Follow-up    HPI: Kristopher [name] is a patient who is now about 4 months out right total knee replacement.  Doing well with no problems.  He is doing his own physical therapy.  On examination he has good range of motion and improving strength.  Walking endurance is improving.  Plan at this time is to return to work 08/29/2020.  Continue to work on leg strengthening exercises.  Follow-up as needed.              ROS: See above  Assessment & Plan: Visit Diagnoses:  1. Status post total right knee replacement     Plan: See above  Follow-Up Instructions: No follow-ups on file.   Orders:  No orders of the defined types were placed in this encounter.  No orders of the defined types were placed in this encounter.     Procedures: No procedures performed   Clinical Data: No additional findings.  Objective: Vital Signs: There were no vitals taken for this visit.  Physical Exam: See above  Ortho Exam: See above  Specialty Comments:  No specialty comments available.  Imaging: No results found.   PMFS History: Patient Active Problem List   Diagnosis Date Noted  . Arthritis of right knee 04/05/2020  . Arthritis of left knee 09/13/2019  . Pain and swelling of ankle, right 12/09/2017  . Hyperlipidemia with target LDL less than 100 06/18/2017  . Hypertensive heart disease without heart failure 06/18/2017  . Bilateral renal cysts 06/18/2017  . Resistant hypertension 05/18/2017  . H/O hypokalemia 05/18/2017  . Framingham cardiac risk 10-20% in next 10 years 03/17/2017  . Diverticulosis of large intestine without hemorrhage 09/09/2016  . Benign essential  hypertension 07/15/2016  . Acute bilateral ankle pain 09/04/2015  . Closed nondisplaced fracture of navicular bone of left foot 09/04/2015  . Pain in both feet 09/04/2015  . Primary osteoarthritis of both knees 05/30/2015  . Arthralgia of both knees 01/05/2015  . Arthralgia of left hip 01/05/2015  . Abnormal EKG 06/18/2014  . Knee pain 03/28/2014   Past Medical History:  Diagnosis Date  . Ambulates with cane   . Arthritis   . CKD (chronic kidney disease)    stage III (nephrologist: Terrial Rhodes, MD) 03/2019  . Diabetes mellitus without complication (HCC)    Type 2  . GERD (gastroesophageal reflux disease)   . Hyperlipidemia   . Hypertension   . Seasonal allergies     Family History  Problem Relation Age of Onset  . Cancer Mother   . Cancer Father   . Cancer Sister   . Hypertension Sister   . Cancer Brother   . Hypertension Brother   . Cancer Sister   . Hypertension Sister     Past Surgical History:  Procedure Laterality Date  . COLONOSCOPY     x2  . HAND SURGERY Left 1983  . KNEE ARTHROSCOPY Left 1986  . MULTIPLE TOOTH EXTRACTIONS    . RENAL ARTERY DOPPLERS  05/29/2017    Normal Renal A Bilaterally.  L > R kidney (3.2 cm) . Renal  Cysts noted  - larges ~3.7 cm x 3.5 cm (R kidney), L kidney - ~1.7 cm x 1.6 cm -> recommend referral to Nephrology  . TOTAL KNEE ARTHROPLASTY Left 09/13/2019  . TOTAL KNEE ARTHROPLASTY Left 09/13/2019   Procedure: LEFT TOTAL KNEE ARTHROPLASTY;  Surgeon: Cammy Copa, MD;  Location: [facility] OR;  Service: Orthopedics;  Laterality: Left;  . TOTAL KNEE ARTHROPLASTY Right 04/05/2020   Procedure: RIGHT TOTAL KNEE ARTHROPLASTY;  Surgeon: Cammy Copa, MD;  Location: [facility] OR;  Service: Orthopedics;  Laterality: Right;  . TRANSTHORACIC ECHOCARDIOGRAM  05/2017   Normal Echo. Mod Conc LVH. EF 55-60%.  No RWMA. Normal Valves. Gr 1 DD.  Marland Kitchen UPPER GI ENDOSCOPY     2x   Social History   Occupational History  . Not on file  Tobacco Use  .  Smoking status: Never Smoker  . Smokeless tobacco: Never Used  Vaping Use  . Vaping Use: Never used  Substance and Sexual Activity  . Alcohol use: Yes    Comment: occasional beer/wine  . Drug use: No  . Sexual activity: Not on file

## 2020-09-30 ENCOUNTER — Other Ambulatory Visit: Payer: Self-pay | Admitting: Cardiology

## 2020-10-10 ENCOUNTER — Other Ambulatory Visit: Payer: Self-pay | Admitting: Cardiology

## 2020-10-10 NOTE — Telephone Encounter (Signed)
Rx request sent to pharmacy.  

## 2021-04-17 ENCOUNTER — Ambulatory Visit (INDEPENDENT_AMBULATORY_CARE_PROVIDER_SITE_OTHER): Payer: No Typology Code available for payment source

## 2021-04-17 ENCOUNTER — Encounter: Payer: Self-pay | Admitting: Orthopedic Surgery

## 2021-04-17 ENCOUNTER — Ambulatory Visit (INDEPENDENT_AMBULATORY_CARE_PROVIDER_SITE_OTHER): Payer: No Typology Code available for payment source | Admitting: Orthopedic Surgery

## 2021-04-17 ENCOUNTER — Ambulatory Visit: Payer: Self-pay

## 2021-04-17 DIAGNOSIS — Z96653 Presence of artificial knee joint, bilateral: Secondary | ICD-10-CM

## 2021-04-17 NOTE — Progress Notes (Signed)
Office Visit Note   Patient: Kristopher MCGOWAN Sr.           Date of Birth: April 05, 1961           MRN: [id number] Visit Date: 04/17/2021 Requested by: Daylene Katayama, PA [address] [place],  [place] [address] PCP: Daylene Katayama, PA  Subjective: Chief Complaint  Patient presents with  . Left Knee - Follow-up    DOS 09/13/19 TKA  . Right Knee - Follow-up    DOS 04/15/20 TKA    HPI: Kristopher [name] is a patient underwent right total knee replacement 04/05/2020 left total knee replacement 09/13/2019.  Has been doing well.  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  The left knee is service-connected.              ROS: All systems reviewed are negative as they relate to the chief complaint within the history of present illness.  Patient denies  fevers or chills.   Assessment & Plan: Visit Diagnoses:  1. History of bilateral knee arthroplasty     Plan: Impression is well-functioning left and right total knee replacements.  No effusion.  X-rays look very good.  Knees are extremely functional.  Plan at this time is to be careful with oral health and any type of infection down below the knees.  Continue with nonweightbearing quad strengthening exercises and follow-up as needed  Follow-Up Instructions: Return if symptoms worsen or fail to improve.   Orders:  Orders Placed This Encounter  Procedures  . XR Knee 1-2 Views Right  . XR Knee 1-2 Views Left   No orders of the defined types were placed in this encounter.     Procedures: No procedures performed   Clinical Data: No additional findings.  Objective: Vital Signs: There were no vitals taken for this visit.  Physical Exam:   Constitutional: Patient appears well-developed HEENT:  Head: Normocephalic Eyes:EOM are normal Neck: Normal range of motion Cardiovascular: Normal rate Pulmonary/chest: Effort normal Neurologic: Patient is alert Skin: Skin is warm Psychiatric: Patient has normal mood and  affect    Ortho Exam: Ortho exam demonstrates full active and passive range of motion of the ankles and hips.  No effusion in either knee.  Both knees have range of motion 0 to about 120.  Collaterals are stable to varus valgus stress in both knees at 0 30 and 90 degrees.  Extensor mechanism is intact.  Specialty Comments:  No specialty comments available.  Imaging:    PMFS History: Patient Active Problem List   Diagnosis Date Noted  . Arthritis of right knee 04/05/2020  . Arthritis of left knee 09/13/2019  . Pain and swelling of ankle, right 12/09/2017  . Hyperlipidemia with target LDL less than 100 06/18/2017  . Hypertensive heart disease without heart failure 06/18/2017  . Bilateral renal cysts 06/18/2017  . Resistant hypertension 05/18/2017  . H/O hypokalemia 05/18/2017  . Framingham cardiac risk 10-20% in next 10 years 03/17/2017  . Diverticulosis of large intestine without hemorrhage 09/09/2016  . Benign essential hypertension 07/15/2016  . Acute bilateral ankle pain 09/04/2015  . Closed nondisplaced fracture of navicular bone of left foot 09/04/2015  . Pain in both feet 09/04/2015  . Primary osteoarthritis of both knees 05/30/2015  . Arthralgia of both knees 01/05/2015  . Arthralgia of left hip 01/05/2015  . Abnormal EKG 06/18/2014  . Knee pain 03/28/2014   Past Medical History:  Diagnosis Date  . Ambulates with cane   . Arthritis   .  CKD (chronic kidney disease)    stage III (nephrologist: Terrial Rhodes, MD) 03/2019  . Diabetes mellitus without complication (HCC)    Type 2  . GERD (gastroesophageal reflux disease)   . Hyperlipidemia   . Hypertension   . Seasonal allergies     Family History  Problem Relation Age of Onset  . Cancer Mother   . Cancer Father   . Cancer Sister   . Hypertension Sister   . Cancer Brother   . Hypertension Brother   . Cancer Sister   . Hypertension Sister     Past Surgical History:  Procedure Laterality Date  .  COLONOSCOPY     x2  . HAND SURGERY Left 1983  . KNEE ARTHROSCOPY Left 1986  . MULTIPLE TOOTH EXTRACTIONS    . RENAL ARTERY DOPPLERS  05/29/2017    Normal Renal A Bilaterally.  L > R kidney (3.2 cm) . Renal Cysts noted  - larges ~3.7 cm x 3.5 cm (R kidney), L kidney - ~1.7 cm x 1.6 cm -> recommend referral to Nephrology  . TOTAL KNEE ARTHROPLASTY Left 09/13/2019  . TOTAL KNEE ARTHROPLASTY Left 09/13/2019   Procedure: LEFT TOTAL KNEE ARTHROPLASTY;  Surgeon: Cammy Copa, MD;  Location: [facility] OR;  Service: Orthopedics;  Laterality: Left;  . TOTAL KNEE ARTHROPLASTY Right 04/05/2020   Procedure: RIGHT TOTAL KNEE ARTHROPLASTY;  Surgeon: Cammy Copa, MD;  Location: [facility] OR;  Service: Orthopedics;  Laterality: Right;  . TRANSTHORACIC ECHOCARDIOGRAM  05/2017   Normal Echo. Mod Conc LVH. EF 55-60%.  No RWMA. Normal Valves. Gr 1 DD.  Marland Kitchen UPPER GI ENDOSCOPY     2x   Social History   Occupational History  . Not on file  Tobacco Use  . Smoking status: Never Smoker  . Smokeless tobacco: Never Used  Vaping Use  . Vaping Use: Never used  Substance and Sexual Activity  . Alcohol use: Yes    Comment: occasional beer/wine  . Drug use: No  . Sexual activity: Not on file

## 2022-10-05 ENCOUNTER — Emergency Department (HOSPITAL_BASED_OUTPATIENT_CLINIC_OR_DEPARTMENT_OTHER): Payer: No Typology Code available for payment source | Admitting: Radiology

## 2022-10-05 ENCOUNTER — Emergency Department (HOSPITAL_BASED_OUTPATIENT_CLINIC_OR_DEPARTMENT_OTHER)
Admission: EM | Admit: 2022-10-05 | Discharge: 2022-10-05 | Disposition: A | Discharge status: Home / Self Care | Payer: No Typology Code available for payment source | Attending: Student | Admitting: Student

## 2022-10-05 ENCOUNTER — Other Ambulatory Visit: Payer: Self-pay

## 2022-10-05 DIAGNOSIS — I10 Essential (primary) hypertension: Secondary | ICD-10-CM | POA: Insufficient documentation

## 2022-10-05 DIAGNOSIS — Z79899 Other long term (current) drug therapy: Secondary | ICD-10-CM | POA: Insufficient documentation

## 2022-10-05 DIAGNOSIS — R519 Headache, unspecified: Secondary | ICD-10-CM | POA: Diagnosis present

## 2022-10-05 LAB — BASIC METABOLIC PANEL
Anion gap: 12 (ref 5–15)
BUN: 22 mg/dL (ref 8–23)
CO2: 22 mmol/L (ref 22–32)
Calcium: 10.5 mg/dL — ABNORMAL HIGH (ref 8.9–10.3)
Chloride: 101 mmol/L (ref 98–111)
Creatinine, Ser: 1.38 mg/dL — ABNORMAL HIGH (ref 0.61–1.24)
GFR, Estimated: 58 mL/min — ABNORMAL LOW (ref >60)
Glucose, Bld: 211 mg/dL — ABNORMAL HIGH (ref 70–99)
Potassium: 4.2 mmol/L (ref 3.5–5.1)
Sodium: 135 mmol/L (ref 135–145)

## 2022-10-05 LAB — CBC
HCT: 37.4 % — ABNORMAL LOW (ref 39.0–52.0)
Hemoglobin: 12.8 g/dL — ABNORMAL LOW (ref 13.0–17.0)
MCH: 29.9 pg (ref 26.0–34.0)
MCHC: 34.2 g/dL (ref 30.0–36.0)
MCV: 87.4 fL (ref 80.0–100.0)
Platelets: 309 10*3/uL (ref 150–400)
RBC: 4.28 MIL/uL (ref 4.22–5.81)
RDW: 12.4 % (ref 11.5–15.5)
WBC: 8.9 10*3/uL (ref 4.0–10.5)
nRBC: 0 % (ref 0.0–0.2)

## 2022-10-05 LAB — TROPONIN I (HIGH SENSITIVITY)
Troponin I (High Sensitivity): 5 ng/L (ref <18)
Troponin I (High Sensitivity): 5 ng/L (ref <18)

## 2022-10-05 NOTE — ED Provider Notes (Signed)
[facility] EMERGENCY DEPT Provider Note   CSN: [id number] Arrival date & time: 10/05/22  1210     History  Chief Complaint  Patient presents with   Hypertension    Kristopher YOHO Sr. is a 61 y.o. male.  With history of hypertension who presents ED for evaluation of high blood pressure readings.  He states he missed his dose of losartan and spironolactone on Friday.  He woke up yesterday and states he felt "bad" but this resolved throughout the day.  At the end of the day he had a headache, but was able to sleep through the night.  Woke up today with a slight headache that he rates at a 2 out of 10.  Did take Tylenol at 6 AM.  Currently asymptomatic and specifically denies headache, vision changes, chest pain, shortness of breath, numbness, weakness, tingling, dizziness, lightheadedness.  Patient also states that he has not taken his blood pressure medication today and that his diet has had more sodium in it than normal.   Hypertension       Home Medications Prior to Admission medications   Medication Sig Start Date End Date Taking? Authorizing Provider  allopurinol (ZYLOPRIM) 100 MG tablet Take 100 mg by mouth 2 (two) times daily.  01/20/19   [provider]  atorvastatin (LIPITOR) 10 MG tablet Take 10 mg by mouth daily. 03/30/17   [provider]  BYSTOLIC 20 MG TABS TAKE 1 TABLET BY MOUTH EVERY DAY Patient taking differently: Take 20 mg by mouth daily.  03/14/19   Leonie Man, MD  carvedilol (COREG) 25 MG tablet Take 12.5 mg by mouth 2 (two) times daily with a meal.    [provider]  chlorthalidone (HYGROTON) 25 MG tablet Take 25 mg by mouth daily.     [provider]  [facility] ASPIRIN ADULT LOW DOSE 81 MG chewable tablet CHEW 1 TABLET (81 MG TOTAL) BY MOUTH DAILY. 08/02/20   Magnant, Charles L, PA-C  glipiZIDE (GLUCOTROL) 5 MG tablet Take 5 mg by mouth daily before breakfast.    [provider]  KLOR-CON M10 10 MEQ  tablet Take 10 mEq by mouth daily. 01/01/20   [provider]  losartan (COZAAR) 25 MG tablet Take 1 tablet (25 mg total) by mouth daily. Please schedule appointment with Dr. Ellyn Hack for refills. 10/10/20   Leonie Man, MD  metFORMIN (GLUCOPHAGE) 1000 MG tablet Take 1,000 mg by mouth 2 (two) times daily with a meal.    [provider]  methocarbamol (ROBAXIN) 500 MG tablet Take 1 tablet (500 mg total) by mouth every 8 (eight) hours as needed for muscle spasms. Patient not taking: Reported on 03/22/2020 09/15/19   Magnant, Gerrianne Scale, PA-C  oxyCODONE (OXY IR/ROXICODONE) 5 MG immediate release tablet Take 1-2 tablets (5-10 mg total) by mouth every 4 (four) hours as needed for moderate pain (pain score 4-6). 04/06/20   Magnant, Gerrianne Scale, PA-C  spironolactone (ALDACTONE) 50 MG tablet Take 1 tablet (50 mg total) by mouth daily. Please schedule annual appt with Dr. Ellyn Hack for refills. [phone]. 1st attempt. 02/22/20   Leonie Man, MD      Allergies    Patient has no known allergies.    Review of Systems   Review of Systems  Constitutional:        High BP  All other systems reviewed and are negative.   Physical Exam Updated Vital Signs BP (!) 168/114 (BP Location: Right Arm) Comment: Pt stated  that he has not taken BP meds today. Will take when he goes home.  Pulse 75   Temp 97.7 F (36.5 C) (Oral)   Resp 12   Ht 6\' 3"  (1.905 m)   Wt 121.6 kg   SpO2 97%   BMI 33.50 kg/m  Physical Exam Vitals and nursing note reviewed.  Constitutional:      General: He is not in acute distress.    Appearance: Normal appearance. He is well-developed. He is obese. He is not ill-appearing, toxic-appearing or diaphoretic.  HENT:     Head: Normocephalic and atraumatic.     Mouth/Throat:     Mouth: Mucous membranes are moist.     Pharynx: Oropharynx is clear.  Eyes:     Extraocular Movements: Extraocular movements intact.     Conjunctiva/sclera: Conjunctivae normal.     Pupils:  Pupils are equal, round, and reactive to light.  Cardiovascular:     Rate and Rhythm: Normal rate and regular rhythm.     Pulses: Normal pulses.     Heart sounds: Normal heart sounds. No murmur heard. Pulmonary:     Effort: Pulmonary effort is normal. No respiratory distress.     Breath sounds: Normal breath sounds. No stridor. No wheezing, rhonchi or rales.  Abdominal:     General: Abdomen is flat.     Palpations: Abdomen is soft.     Tenderness: There is no abdominal tenderness.  Musculoskeletal:        General: No swelling.     Cervical back: Normal range of motion and neck supple.     Right lower leg: No edema.     Left lower leg: No edema.  Skin:    General: Skin is warm and dry.     Capillary Refill: Capillary refill takes less than 2 seconds.  Neurological:     General: No focal deficit present.     Mental Status: He is alert and oriented to person, place, and time.     Comments: No pronator drift, facial asymmetry, slurred speech, unilateral or global weakness  Psychiatric:        Mood and Affect: Mood normal.     ED Results / Procedures / Treatments   Labs (all labs ordered are listed, but only abnormal results are displayed) Labs Reviewed  BASIC METABOLIC PANEL - Abnormal; Notable for the following components:      Result Value   Glucose, Bld 211 (*)    Creatinine, Ser 1.38 (*)    Calcium 10.5 (*)    GFR, Estimated 58 (*)    All other components within normal limits  CBC - Abnormal; Notable for the following components:   Hemoglobin 12.8 (*)    HCT 37.4 (*)    All other components within normal limits  TROPONIN I (HIGH SENSITIVITY)  TROPONIN I (HIGH SENSITIVITY)    EKG None  Radiology DG Chest 2 View  Result Date: 10/05/2022 CLINICAL DATA:  Epigastric pain, elevated blood pressure and headache since Thursday EXAM: CHEST - 2 VIEW COMPARISON:  10/09/2018 FINDINGS: Enlargement of cardiac silhouette. Mediastinal contours and pulmonary vascularity normal.  LEFT basilar opacity question atelectasis versus infiltrate. Minimal atelectasis RIGHT base. Upper lungs clear. No pleural effusion, pneumothorax or acute osseous abnormalities. IMPRESSION: RIGHT basilar atelectasis with slightly greater atelectasis versus infiltrate at LEFT base. Mild enlargement of cardiac silhouette. Electronically Signed   By: Lavonia Dana M.D.   On: 10/05/2022 13:13    Procedures Procedures    Medications Ordered in ED  Medications - No data to display  ED Course/ Medical Decision Making/ A&P                           Medical Decision Making Amount and/or Complexity of Data Reviewed Labs: ordered. Radiology: ordered.  This patient presents to the ED for concern of high blood pressure, this involves an extensive number of treatment options, and is a complaint that carries with it a high risk of complications and morbidity.     Co morbidities that complicate the patient evaluation  Hypertension  My initial workup includes ACS rule out  Additional history obtained from: Nursing notes from this visit.   I ordered, reviewed and interpreted labs which include: Troponin, delta troponin, CBC, BMP.  BMP shows stable creatinine of 1.38.  CBC shows stable anemia of 12.8.   I ordered imaging studies including chest x-ray I independently visualized and interpreted imaging which showed no cardiopulmonary abnormalities I agree with the radiologist interpretation  Cardiac Monitoring:  The patient was maintained on a cardiac monitor.  I personally viewed and interpreted the cardiac monitored which showed an underlying rhythm of: NSR  Afebrile, hemodynamically stable.  Patient is a 61 year old male with a history hypertension who presents to the ED for evaluation of high blood pressure readings at home.  He states he missed his dose of losartan and spironolactone on Friday.  Woke up and had minor symptoms on Saturday.  He also reports missing his dose of these medications  today.  He also reports higher sodium in his diet than he normally has.  I believe these combinations of factors most accurately account for his recent elevation on BP reading from his baseline of 140/90.  ACS was ruled out today. Patient was strongly encouraged to follow up with his PCP within 1 week and urged to take his medications as prescribed and to lower his dietary sodium. Gave strict return precautions. Stable at discharge.  At this time there does not appear to be any evidence of an acute emergency medical condition and the patient appears stable for discharge with appropriate outpatient follow up. Diagnosis was discussed with patient who verbalizes understanding of care plan and is agreeable to discharge. I have discussed return precautions with patient who verbalizes understanding. Patient encouraged to follow-up with their PCP within 1 week. All questions answered.  Patient's case discussed with Dr. Matilde Sprang who agrees with plan to discharge with follow-up.   Note: Portions of this report may have been transcribed using voice recognition software. Every effort was made to ensure accuracy; however, inadvertent computerized transcription errors may still be present.          Final Clinical Impression(s) / ED Diagnoses Final diagnoses:  None    Rx / DC Orders ED Discharge Orders     None         Nehemiah Massed 10/05/22 1604    Teressa Lower, MD 10/05/22 2013

## 2022-10-05 NOTE — Discharge Instructions (Signed)
You have been seen today for your complaint of high blood pressure. Your lab work was reassuring and showed no abnormalities. Your imaging was reassuring and showed no abnormalities. Home care instructions are as follows:  You should continue to take your medications as prescribed.  You should reduce the amount of salt in your diet. Follow up with: Your primary care provider in 1 week Please seek immediate medical care if you develop any of the following symptoms: You get a very bad headache. You start to feel mixed up (confused). You feel weak or numb. You feel faint. You have very bad pain in your: Chest. Belly (abdomen). You vomit more than once. You have trouble breathing. At this time there does not appear to be the presence of an emergent medical condition, however there is always the potential for conditions to change. Please read and follow the below instructions.  Do not take your medicine if  develop an itchy rash, swelling in your mouth or lips, or difficulty breathing; call 911 and seek immediate emergency medical attention if this occurs.  You may review your lab tests and imaging results in their entirety on your MyChart account.  Please discuss all results of fully with your primary care provider and other specialist at your follow-up visit.  Note: Portions of this text may have been transcribed using voice recognition software. Every effort was made to ensure accuracy; however, inadvertent computerized transcription errors may still be present.

## 2022-10-05 NOTE — ED Triage Notes (Signed)
Patient arrives with complaints of hypertension for 2 days. Patient initially went to urgent care (BP was 165/108 with an ongoing headache) and he was sent here for further cardiac workup. No chest pain at this time.   Rates headache pain a 2/10.

## 2023-07-10 ENCOUNTER — Emergency Department (HOSPITAL_COMMUNITY): Payer: No Typology Code available for payment source

## 2023-07-10 ENCOUNTER — Other Ambulatory Visit: Payer: Self-pay

## 2023-07-10 ENCOUNTER — Observation Stay (HOSPITAL_COMMUNITY)
Admission: EM | Admit: 2023-07-10 | Discharge: 2023-07-12 | Disposition: A | Discharge status: Home / Self Care | Payer: No Typology Code available for payment source | Attending: Internal Medicine | Admitting: Internal Medicine

## 2023-07-10 DIAGNOSIS — E1165 Type 2 diabetes mellitus with hyperglycemia: Secondary | ICD-10-CM | POA: Insufficient documentation

## 2023-07-10 DIAGNOSIS — I609 Nontraumatic subarachnoid hemorrhage, unspecified: Principal | ICD-10-CM

## 2023-07-10 DIAGNOSIS — N1831 Chronic kidney disease, stage 3a: Secondary | ICD-10-CM | POA: Diagnosis not present

## 2023-07-10 DIAGNOSIS — I10 Essential (primary) hypertension: Secondary | ICD-10-CM | POA: Diagnosis present

## 2023-07-10 DIAGNOSIS — Z96653 Presence of artificial knee joint, bilateral: Secondary | ICD-10-CM | POA: Insufficient documentation

## 2023-07-10 DIAGNOSIS — Y92511 Restaurant or cafe as the place of occurrence of the external cause: Secondary | ICD-10-CM | POA: Insufficient documentation

## 2023-07-10 DIAGNOSIS — R55 Syncope and collapse: Secondary | ICD-10-CM | POA: Diagnosis present

## 2023-07-10 DIAGNOSIS — Z79899 Other long term (current) drug therapy: Secondary | ICD-10-CM | POA: Diagnosis not present

## 2023-07-10 DIAGNOSIS — W010XXA Fall on same level from slipping, tripping and stumbling without subsequent striking against object, initial encounter: Secondary | ICD-10-CM | POA: Diagnosis not present

## 2023-07-10 DIAGNOSIS — Z7984 Long term (current) use of oral hypoglycemic drugs: Secondary | ICD-10-CM | POA: Insufficient documentation

## 2023-07-10 DIAGNOSIS — Z85528 Personal history of other malignant neoplasm of kidney: Secondary | ICD-10-CM | POA: Insufficient documentation

## 2023-07-10 DIAGNOSIS — S066X9A Traumatic subarachnoid hemorrhage with loss of consciousness of unspecified duration, initial encounter: Principal | ICD-10-CM | POA: Insufficient documentation

## 2023-07-10 DIAGNOSIS — E1122 Type 2 diabetes mellitus with diabetic chronic kidney disease: Secondary | ICD-10-CM | POA: Insufficient documentation

## 2023-07-10 DIAGNOSIS — N179 Acute kidney failure, unspecified: Secondary | ICD-10-CM | POA: Diagnosis not present

## 2023-07-10 DIAGNOSIS — E119 Type 2 diabetes mellitus without complications: Secondary | ICD-10-CM

## 2023-07-10 DIAGNOSIS — W19XXXA Unspecified fall, initial encounter: Principal | ICD-10-CM

## 2023-07-10 DIAGNOSIS — E669 Obesity, unspecified: Secondary | ICD-10-CM | POA: Insufficient documentation

## 2023-07-10 DIAGNOSIS — I129 Hypertensive chronic kidney disease with stage 1 through stage 4 chronic kidney disease, or unspecified chronic kidney disease: Secondary | ICD-10-CM | POA: Diagnosis not present

## 2023-07-10 LAB — COMPREHENSIVE METABOLIC PANEL
ALT: 18 U/L (ref 0–44)
AST: 18 U/L (ref 15–41)
Albumin: 4.1 g/dL (ref 3.5–5.0)
Alkaline Phosphatase: 76 U/L (ref 38–126)
Anion gap: 11 (ref 5–15)
BUN: 47 mg/dL — ABNORMAL HIGH (ref 8–23)
CO2: 17 mmol/L — ABNORMAL LOW (ref 22–32)
Calcium: 10.1 mg/dL (ref 8.9–10.3)
Chloride: 109 mmol/L (ref 98–111)
Creatinine, Ser: 2.01 mg/dL — ABNORMAL HIGH (ref 0.61–1.24)
GFR, Estimated: 37 mL/min — ABNORMAL LOW (ref >60)
Glucose, Bld: 173 mg/dL — ABNORMAL HIGH (ref 70–99)
Potassium: 4.7 mmol/L (ref 3.5–5.1)
Sodium: 137 mmol/L (ref 135–145)
Total Bilirubin: 0.5 mg/dL (ref 0.3–1.2)
Total Protein: 7.5 g/dL (ref 6.5–8.1)

## 2023-07-10 LAB — HIV ANTIBODY (ROUTINE TESTING W REFLEX): HIV Screen 4th Generation wRfx: NONREACTIVE

## 2023-07-10 LAB — CBC WITH DIFFERENTIAL/PLATELET
Abs Immature Granulocytes: 0.05 10*3/uL (ref 0.00–0.07)
Basophils Absolute: 0.1 10*3/uL (ref 0.0–0.1)
Basophils Relative: 1 %
Eosinophils Absolute: 0.3 10*3/uL (ref 0.0–0.5)
Eosinophils Relative: 3 %
HCT: 39.1 % (ref 39.0–52.0)
Hemoglobin: 13 g/dL (ref 13.0–17.0)
Immature Granulocytes: 1 %
Lymphocytes Relative: 28 %
Lymphs Abs: 2.6 10*3/uL (ref 0.7–4.0)
MCH: 30.6 pg (ref 26.0–34.0)
MCHC: 33.2 g/dL (ref 30.0–36.0)
MCV: 92 fL (ref 80.0–100.0)
Monocytes Absolute: 0.7 10*3/uL (ref 0.1–1.0)
Monocytes Relative: 8 %
Neutro Abs: 5.6 10*3/uL (ref 1.7–7.7)
Neutrophils Relative %: 59 %
Platelets: 309 10*3/uL (ref 150–400)
RBC: 4.25 MIL/uL (ref 4.22–5.81)
RDW: 13.9 % (ref 11.5–15.5)
WBC: 9.3 10*3/uL (ref 4.0–10.5)
nRBC: 0 % (ref 0.0–0.2)

## 2023-07-10 LAB — MAGNESIUM: Magnesium: 1.8 mg/dL (ref 1.7–2.4)

## 2023-07-10 LAB — CBG MONITORING, ED: Glucose-Capillary: 111 mg/dL — ABNORMAL HIGH (ref 70–99)

## 2023-07-10 LAB — TROPONIN I (HIGH SENSITIVITY): Troponin I (High Sensitivity): 6 ng/L (ref <18)

## 2023-07-10 LAB — BRAIN NATRIURETIC PEPTIDE: B Natriuretic Peptide: 12 pg/mL (ref 0.0–100.0)

## 2023-07-10 MED ORDER — ACETAMINOPHEN 325 MG PO TABS
650.0000 mg | ORAL_TABLET | Freq: Four times a day (QID) | ORAL | Status: DC | PRN
  Administered 2023-07-11 (×2): 650 mg via ORAL
  Filled 2023-07-10 (×2): qty 2

## 2023-07-10 MED ORDER — LACTATED RINGERS IV BOLUS
1000.0000 mL | Freq: Once | INTRAVENOUS | Status: AC
  Administered 2023-07-10: 1000 mL via INTRAVENOUS

## 2023-07-10 MED ORDER — POLYETHYLENE GLYCOL 3350 17 G PO PACK: 17.0000 g | PACK | Freq: Every day | ORAL | Status: DC | PRN

## 2023-07-10 MED ORDER — MORPHINE SULFATE (PF) 4 MG/ML IV SOLN
4.0000 mg | Freq: Once | INTRAVENOUS | Status: AC
  Administered 2023-07-10: 4 mg via INTRAVENOUS
  Filled 2023-07-10: qty 1

## 2023-07-10 MED ORDER — PROCHLORPERAZINE EDISYLATE 10 MG/2ML IJ SOLN: 5.0000 mg | Freq: Four times a day (QID) | INTRAMUSCULAR | Status: DC | PRN

## 2023-07-10 MED ORDER — HYDRALAZINE HCL 20 MG/ML IJ SOLN
2.0000 mg | INTRAMUSCULAR | Status: AC
  Administered 2023-07-11: 2 mg via INTRAVENOUS
  Filled 2023-07-10: qty 1

## 2023-07-10 MED ORDER — INSULIN ASPART 100 UNIT/ML IJ SOLN: 0.0000 [IU] | Freq: Every day | INTRAMUSCULAR | Status: DC

## 2023-07-10 MED ORDER — OXYCODONE HCL 5 MG PO TABS
5.0000 mg | ORAL_TABLET | Freq: Four times a day (QID) | ORAL | Status: DC | PRN
  Administered 2023-07-10 – 2023-07-11 (×3): 5 mg via ORAL
  Filled 2023-07-10 (×3): qty 1

## 2023-07-10 MED ORDER — MELATONIN 5 MG PO TABS: 5.0000 mg | ORAL_TABLET | Freq: Every evening | ORAL | Status: DC | PRN

## 2023-07-10 MED ORDER — INSULIN ASPART 100 UNIT/ML IJ SOLN
0.0000 [IU] | Freq: Three times a day (TID) | INTRAMUSCULAR | Status: DC
  Administered 2023-07-11: 2 [IU] via SUBCUTANEOUS
  Administered 2023-07-11: 3 [IU] via SUBCUTANEOUS
  Administered 2023-07-11 – 2023-07-12 (×2): 2 [IU] via SUBCUTANEOUS

## 2023-07-10 MED ORDER — SODIUM CHLORIDE 0.9 % IV SOLN: INTRAVENOUS | Status: AC

## 2023-07-10 NOTE — ED Provider Notes (Signed)
[place] [facility] AT [facility] Provider Note   CSN: [id number] Arrival date & time: 07/10/23  1841     History Chief Complaint  Patient presents with   Fall    HPI Kristopher HOFMANN Sr. is a 62 y.o. male presenting for ground-level fall.  Patient activated as a level 2 trauma for altered mental status per hospital policy. Patient reports he does not remember the event.  EMS states that he was going to the [employer] to get dinner and when he was at the counter ordering he was witnessed by bystanders to fall over backwards hitting his head on hard tile. Family is with him stated he has been confused since the event.  No seizure-like activity witnessed.  Immediately started trying to get back up but was asking repetitive questioning which is atypical for patient.  He denies blood thinner utilization. Patient denies fevers chills nausea vomiting shortness of breath.  Dors is a headache on his left posterior scalp.   Patient's recorded medical, surgical, social, medication list and allergies were reviewed in the Snapshot window as part of the initial history.   Review of Systems   Review of Systems  Constitutional:  Negative for chills and fever.  HENT:  Negative for ear pain and sore throat.   Eyes:  Negative for pain and visual disturbance.  Respiratory:  Negative for cough and shortness of breath.   Cardiovascular:  Negative for chest pain and palpitations.  Gastrointestinal:  Negative for abdominal pain and vomiting.  Genitourinary:  Negative for dysuria and hematuria.  Musculoskeletal:  Negative for arthralgias and back pain.  Skin:  Negative for color change and rash.  Neurological:  Positive for headaches. Negative for seizures and syncope.  All other systems reviewed and are negative.   Physical Exam Updated Vital Signs BP (!) 165/91 (BP Location: Right Arm)   Pulse 75   Temp 98.4 F (36.9 C) (Oral)   Resp 19   Ht 6\' 3"  (1.905 m)   Wt  121 kg   SpO2 98%   BMI 33.34 kg/m  Physical Exam Vitals and nursing note reviewed.  Constitutional:      General: He is not in acute distress.    Appearance: He is well-developed.  HENT:     Head: Normocephalic and atraumatic.  Eyes:     Conjunctiva/sclera: Conjunctivae normal.  Cardiovascular:     Rate and Rhythm: Normal rate and regular rhythm.     Heart sounds: No murmur heard. Pulmonary:     Effort: Pulmonary effort is normal. No respiratory distress.     Breath sounds: Normal breath sounds.  Abdominal:     Palpations: Abdomen is soft.     Tenderness: There is no abdominal tenderness.  Musculoskeletal:        General: Tenderness and deformity (Visible swelling to the posterior scalp.  Otherwise all joints ranged with no acute pain or tenderness identified.) present. No swelling.     Cervical back: Neck supple.  Skin:    General: Skin is warm and dry.     Capillary Refill: Capillary refill takes less than 2 seconds.  Neurological:     Mental Status: He is alert.  Psychiatric:        Mood and Affect: Mood normal.      ED Course/ Medical Decision Making/ A&P    Procedures .Critical Care  Performed by: Glyn Ade, MD Authorized by: Glyn Ade, MD   Critical care provider statement:  Critical care time (minutes):  30   Critical care was necessary to treat or prevent imminent or life-threatening deterioration of the following conditions:  Trauma   Critical care was time spent personally by me on the following activities:  Development of treatment plan with patient or surrogate, discussions with consultants, evaluation of patient's response to treatment, examination of patient, ordering and review of laboratory studies, ordering and review of radiographic studies, ordering and performing treatments and interventions, pulse oximetry, re-evaluation of patient's condition and review of old charts    Medications Ordered in ED Medications  acetaminophen  (TYLENOL) tablet 650 mg (has no administration in time range)  prochlorperazine (COMPAZINE) injection 5 mg (has no administration in time range)  polyethylene glycol (MIRALAX / GLYCOLAX) packet 17 g (has no administration in time range)  melatonin tablet 5 mg (has no administration in time range)  oxyCODONE (Oxy IR/ROXICODONE) immediate release tablet 5 mg (5 mg Oral Given 07/10/23 2202)  0.9 %  sodium chloride infusion ( Intravenous New Bag/Given 07/10/23 2224)  insulin aspart (novoLOG) injection 0-9 Units (has no administration in time range)  insulin aspart (novoLOG) injection 0-5 Units ( Subcutaneous Not Given 07/10/23 2200)  hydrALAZINE (APRESOLINE) injection 2 mg (has no administration in time range)  morphine (PF) 4 MG/ML injection 4 mg (4 mg Intravenous Given 07/10/23 2019)  lactated ringers bolus 1,000 mL (0 mLs Intravenous Stopped 07/10/23 2223)   Medical Decision Making:    Kristopher Mcalpine Sr. is a 62 y.o. male who presented to the ED today with a high mechanisma trauma, detailed above.    By institutional and departmental policy this was activated as a level 2 trauma. Handoff received from EMS.  Patient placed on continuous vitals and telemetry monitoring while in ED which was reviewed periodically.   Given this mechanism of trauma, a full physical exam was performed.  Reviewed and confirmed nursing documentation for past medical history, family history, social history.    Initial Assessment/Plan:   I was called emergently to patient's bedside for a primary survey.  Primary survey: Airway intact.  BL breath sounds present.   Circulation established with WNL BP, 2 large bore IVs, and radial/femoral pulses.   Disability evaluation negative. No obvious disability requiring intervention.   Patient fully exposed and all injuries were noted, any penetrating injuries were labeled with radiopaque markers.  No emergent interventions took place in the primary survey.    Patient stable  for CXR that demonstrated no traumatic hemopneumothorax and PXR that demonstrated no unstable pelvic fractures.  EFAST deferred.   Secondary survey: Once patient was stabilized, I personally performed a secondary survey to evaluate for any other injuries.  Results of this evaluation documented in the physical exam section. This is a patient presenting with a high mechanism trauma.  As such, I have considered intracranial injuries including intracranial hemorrhage, intrathoracic injuries including blunt myocardial or blunt lung injury, blunt abdominal injuries including aortic dissection, bladder injury, spleen injury, liver injury and I have considered orthopedic injuries including extremity or spinal injury.   This was all evaluated by the below imaging as well as concurrently ordered laboratory evaluation which was reviewed.  Radiology: All radiology results were reviewed independently and agree with reads per radiology provider. CT HEAD WO CONTRAST ( )  Result Date: 07/10/2023 CLINICAL DATA:  Fall EXAM: CT HEAD WITHOUT CONTRAST CT CERVICAL SPINE WITHOUT CONTRAST TECHNIQUE: Multidetector CT imaging of the head and cervical spine was performed following the standard protocol without intravenous contrast. Multiplanar  CT image reconstructions of the cervical spine were also generated. RADIATION DOSE REDUCTION: This exam was performed according to the departmental dose-optimization program which includes automated exposure control, adjustment of the mA and/or kV according to patient size and/or use of iterative reconstruction technique. COMPARISON:  None Available. FINDINGS: CT HEAD FINDINGS Brain: Trace hyperdensity along the anterior right frontal lobe (series 3, image 20 and series 6, image 26), most likely subarachnoid hemorrhage. No evidence of parenchymal hemorrhage. No acute infarct, mass, mass effect, or midline shift. No hydrocephalus. Vascular: No hyperdense vessel. Skull: Negative for fracture  or focal lesion. Large left parietal and occipital hematoma. Small right frontal scalp/forehead hematoma. Sinuses/Orbits: Remote right lamina papyracea fracture. Mucosal thickening in the ethmoid air cells. Mucous retention cysts in the maxillary sinuses. No acute finding in the orbits. Other: The mastoid air cells are well aerated. CT CERVICAL SPINE FINDINGS Alignment: No traumatic listhesis. Skull base and vertebrae: No acute fracture or suspicious osseous lesion. Well corticated osseous fragment at the right aspect of C2 (series 4, image 44), likely sequela of a remote fracture. Soft tissues and spinal canal: No prevertebral fluid or swelling. No visible canal hematoma. Disc levels: Degenerative changes in the cervical spine. Moderate spinal canal stenosis at C5-C6. Severe spinal canal stenosis at C6-C7. Upper chest: Negative. IMPRESSION: 1. Trace subarachnoid hemorrhage along the anterior right frontal lobe. 2. Large left parietal and occipital scalp hematoma. Small right frontal scalp/forehead hematoma. No evidence of calvarial fracture. 3. No acute fracture or traumatic listhesis in the cervical spine. 4. Severe spinal canal stenosis at C6-C7. Moderate spinal canal stenosis at C5-C6. These results were called by telephone at the time of interpretation on 07/10/2023 at 7:57 pm to provider [facility] , who verbally acknowledged these results. Electronically Signed   By: Wiliam Ke M.D.   On: 07/10/2023 19:57   CT CERVICAL SPINE WO CONTRAST  Result Date: 07/10/2023 CLINICAL DATA:  Fall EXAM: CT HEAD WITHOUT CONTRAST CT CERVICAL SPINE WITHOUT CONTRAST TECHNIQUE: Multidetector CT imaging of the head and cervical spine was performed following the standard protocol without intravenous contrast. Multiplanar CT image reconstructions of the cervical spine were also generated. RADIATION DOSE REDUCTION: This exam was performed according to the departmental dose-optimization program which includes automated  exposure control, adjustment of the mA and/or kV according to patient size and/or use of iterative reconstruction technique. COMPARISON:  None Available. FINDINGS: CT HEAD FINDINGS Brain: Trace hyperdensity along the anterior right frontal lobe (series 3, image 20 and series 6, image 26), most likely subarachnoid hemorrhage. No evidence of parenchymal hemorrhage. No acute infarct, mass, mass effect, or midline shift. No hydrocephalus. Vascular: No hyperdense vessel. Skull: Negative for fracture or focal lesion. Large left parietal and occipital hematoma. Small right frontal scalp/forehead hematoma. Sinuses/Orbits: Remote right lamina papyracea fracture. Mucosal thickening in the ethmoid air cells. Mucous retention cysts in the maxillary sinuses. No acute finding in the orbits. Other: The mastoid air cells are well aerated. CT CERVICAL SPINE FINDINGS Alignment: No traumatic listhesis. Skull base and vertebrae: No acute fracture or suspicious osseous lesion. Well corticated osseous fragment at the right aspect of C2 (series 4, image 44), likely sequela of a remote fracture. Soft tissues and spinal canal: No prevertebral fluid or swelling. No visible canal hematoma. Disc levels: Degenerative changes in the cervical spine. Moderate spinal canal stenosis at C5-C6. Severe spinal canal stenosis at C6-C7. Upper chest: Negative. IMPRESSION: 1. Trace subarachnoid hemorrhage along the anterior right frontal lobe. 2. Large left parietal and  occipital scalp hematoma. Small right frontal scalp/forehead hematoma. No evidence of calvarial fracture. 3. No acute fracture or traumatic listhesis in the cervical spine. 4. Severe spinal canal stenosis at C6-C7. Moderate spinal canal stenosis at C5-C6. These results were called by telephone at the time of interpretation on 07/10/2023 at 7:57 pm to provider [facility] , who verbally acknowledged these results. Electronically Signed   By: Wiliam Ke M.D.   On: 07/10/2023 19:57    DG Pelvis Portable  Result Date: 07/10/2023 CLINICAL DATA:  Fall, pelvic injury EXAM: PORTABLE PELVIS 1-2 VIEWS COMPARISON:  None Available. FINDINGS: The right hand overlies the right iliac crest slightly limiting evaluation of this region. Normal alignment. No fracture or dislocation. Sacroiliac and hip joint spaces are preserved. Soft tissues are unremarkable. IMPRESSION: 1. No fracture or dislocation. Electronically Signed   By: Helyn Numbers M.D.   On: 07/10/2023 19:16   DG Chest Portable 1 View  Result Date: 07/10/2023 CLINICAL DATA:  Fall EXAM: PORTABLE CHEST 1 VIEW COMPARISON:  10/05/2022 FINDINGS: Mild left basilar nodular infiltrate has developed possibly related to acute infection or aspiration. Lungs are otherwise clear. No pneumothorax. No pleural effusion. Cardiac size is within normal limits. No acute bone abnormality. IMPRESSION: 1. Mild left basilar nodular infiltrate, possibly infectious or inflammatory. Electronically Signed   By: Helyn Numbers M.D.   On: 07/10/2023 19:09    Final Reassessment and Plan:   Consulted neurosurgery for patient's subarachnoid hemorrhage.  They recommended repeat CT scan in the morning.  Additionally, given concern for syncope I consulted hospitalist who agreed with need for admission.  Patient admitted with no further acute events.   Disposition:   Based on the above findings, I believe this patient is stable for admission.    Patient/family educated about specific findings on our evaluation and explained exact reasons for admission.  Patient/family educated about clinical situation and time was allowed to answer questions.   Admission team communicated with and agreed with need for admission. Patient admitted. Patient ready to move at this time.     Emergency Department Medication Summary:   Medications  acetaminophen (TYLENOL) tablet 650 mg (has no administration in time range)  prochlorperazine (COMPAZINE) injection 5 mg (has no  administration in time range)  polyethylene glycol (MIRALAX / GLYCOLAX) packet 17 g (has no administration in time range)  melatonin tablet 5 mg (has no administration in time range)  oxyCODONE (Oxy IR/ROXICODONE) immediate release tablet 5 mg (5 mg Oral Given 07/10/23 2202)  0.9 %  sodium chloride infusion ( Intravenous New Bag/Given 07/10/23 2224)  insulin aspart (novoLOG) injection 0-9 Units (has no administration in time range)  insulin aspart (novoLOG) injection 0-5 Units ( Subcutaneous Not Given 07/10/23 2200)  hydrALAZINE (APRESOLINE) injection 2 mg (has no administration in time range)  morphine (PF) 4 MG/ML injection 4 mg (4 mg Intravenous Given 07/10/23 2019)  lactated ringers bolus 1,000 mL (0 mLs Intravenous Stopped 07/10/23 2223)           Clinical Impression:  1. Fall, initial encounter      Admit   Final Clinical Impression(s) / ED Diagnoses Final diagnoses:  Fall, initial encounter    Rx / DC Orders ED Discharge Orders     None         Glyn Ade, MD 07/10/23 [phone]

## 2023-07-10 NOTE — ED Triage Notes (Signed)
[facility] with a [facility] of fall with loc. Pt was standing in line to order food when he fell straight back hit his head on tile. Pt has softball size hematoma to the back of his head. Pt was confused and combative when EMS arrived on scene.  Pt denies blood thinners, relays head  pain.

## 2023-07-10 NOTE — H&P (Signed)
History and Physical  Kristopher [name] ZOX:[id number] DOB: 1961-05-07 DOA: 07/10/2023  Referring physician: Dr. Doran Durand, EDP  PCP: [facility]  Outpatient Specialists: Urology Patient coming from: Home  Chief Complaint: Fall, syncope  HPI: Kristopher [name]. is a 62 y.o. male with medical history significant for renal cell carcinoma, BPH with obstruction, hypertension, obesity, who presents after a fall with loss of consciousness.  [place] [employer] and [place] standing in line.  States he accidentally dropped his wallet on the ground and reach down to pick it up.  When he got up he does not remember how he ended up on the ground.  States he found himself on tile.  Denies palpitations or chest pain prior to his fall.  Denies history of frequent falls but admits to intermittent lightheadedness recently.  Denies any anginal symptoms or shortness of breath.  Unclear how long he was unconscious.  No seizure-like activity or postictal symptoms reported.  No recent chills or subjective fevers.  EMS was activated.  In the ED, his BP was elevated above his baseline.  Stated he did not take his blood pressure medications this morning.  Noncontrast head CT revealed the following findings:  1. Trace subarachnoid hemorrhage along the anterior right frontal lobe. 2. Large left parietal and occipital scalp hematoma. Small right frontal scalp/forehead hematoma. No evidence of calvarial fracture. 3. No acute fracture or traumatic listhesis in the cervical spine. 4. Severe spinal canal stenosis at C6-C7. Moderate spinal canal stenosis at C5-C6.   The patient was given analgesics in the ED.  EDP discussed this case with neurosurgery who recommended repeating noncontrast head CT in 6 hours.  Neurosurgery will follow in consultation.  Due to syncope, a syncope workup was initiated.  The patient was admitted by [facility], hospitalist service, to telemetry medical unit as observation  status.  ED Course: Temperature 98.2.  BP 146/99, pulse 89, respiratory 20, saturation 95% on room air.  Lab studies notable for serum bicarb 17, glucose 173, BUN 47, creatinine 2.01, GFR 37.  Baseline creatinine 1.38 with GFR 58.  High-sensitivity troponin 6.  BNP 12.  CBC essentially unremarkable.  Review of Systems: Review of systems as noted in the HPI. All other systems reviewed and are negative.   Past Medical History:  Diagnosis Date   Ambulates with cane    Arthritis    CKD (chronic kidney disease)    stage III (nephrologist: Terrial Rhodes, MD) 03/2019   Diabetes mellitus without complication (HCC)    Type 2   GERD (gastroesophageal reflux disease)    Hyperlipidemia    Hypertension    Seasonal allergies    Past Surgical History:  Procedure Laterality Date   COLONOSCOPY     x2   HAND SURGERY Left 1983   KNEE ARTHROSCOPY Left 1986   MULTIPLE TOOTH EXTRACTIONS     RENAL ARTERY DOPPLERS  05/29/2017    Normal Renal A Bilaterally.  L > R kidney (3.2 cm) . Renal Cysts noted  - larges ~3.7 cm x 3.5 cm (R kidney), L kidney - ~1.7 cm x 1.6 cm -> recommend referral to Nephrology   TOTAL KNEE ARTHROPLASTY Left 09/13/2019   TOTAL KNEE ARTHROPLASTY Left 09/13/2019   Procedure: LEFT TOTAL KNEE ARTHROPLASTY;  Surgeon: Cammy Copa, MD;  Location: [facility] OR;  Service: Orthopedics;  Laterality: Left;   TOTAL KNEE ARTHROPLASTY Right 04/05/2020   Procedure: RIGHT TOTAL KNEE ARTHROPLASTY;  Surgeon: Cammy Copa, MD;  Location:  [facility] OR;  Service: Orthopedics;  Laterality: Right;   TRANSTHORACIC ECHOCARDIOGRAM  05/2017   Normal Echo. Mod Conc LVH. EF 55-60%.  No RWMA. Normal Valves. Gr 1 DD.   UPPER GI ENDOSCOPY     2x    Social History:  reports that he has never smoked. He has never used smokeless tobacco. He reports current alcohol use. He reports that he does not use drugs.   No Known Allergies  Family History  Problem Relation Age of Onset   Cancer Mother    Cancer  Father    Cancer Sister    Hypertension Sister    Cancer Brother    Hypertension Brother    Cancer Sister    Hypertension Sister       Prior to Admission medications   Medication Sig Start Date End Date Taking? Authorizing Provider  allopurinol (ZYLOPRIM) 100 MG tablet Take 100 mg by mouth 2 (two) times daily.  01/20/19   [provider]  atorvastatin (LIPITOR) 10 MG tablet Take 10 mg by mouth daily. 03/30/17   [provider]  BYSTOLIC 20 MG TABS TAKE 1 TABLET BY MOUTH EVERY DAY Patient taking differently: Take 20 mg by mouth daily.  03/14/19   Marykay Lex, MD  carvedilol (COREG) 25 MG tablet Take 12.5 mg by mouth 2 (two) times daily with a meal.    [provider]  chlorthalidone (HYGROTON) 25 MG tablet Take 25 mg by mouth daily.     [provider]  [facility] ASPIRIN ADULT LOW DOSE 81 MG chewable tablet CHEW 1 TABLET (81 MG TOTAL) BY MOUTH DAILY. 08/02/20   Magnant, Charles L, PA-C  glipiZIDE (GLUCOTROL) 5 MG tablet Take 5 mg by mouth daily before breakfast.    [provider]  KLOR-CON M10 10 MEQ tablet Take 10 mEq by mouth daily. 01/01/20   [provider]  losartan (COZAAR) 25 MG tablet Take 1 tablet (25 mg total) by mouth daily. Please schedule appointment with Dr. Herbie Baltimore for refills. 10/10/20   Marykay Lex, MD  metFORMIN (GLUCOPHAGE) 1000 MG tablet Take 1,000 mg by mouth 2 (two) times daily with a meal.    [provider]  methocarbamol (ROBAXIN) 500 MG tablet Take 1 tablet (500 mg total) by mouth every 8 (eight) hours as needed for muscle spasms. Patient not taking: Reported on 03/22/2020 09/15/19   Magnant, Joycie Peek, PA-C  oxyCODONE (OXY IR/ROXICODONE) 5 MG immediate release tablet Take 1-2 tablets (5-10 mg total) by mouth every 4 (four) hours as needed for moderate pain (pain score 4-6). 04/06/20   Magnant, Joycie Peek, PA-C  spironolactone (ALDACTONE) 50 MG tablet Take 1 tablet (50 mg total) by mouth daily. Please schedule  annual appt with Dr. Herbie Baltimore for refills. [phone]. 1st attempt. 02/22/20   Marykay Lex, MD    Physical Exam: BP (!) 140/62 (BP Location: Right Arm)   Pulse 93   Temp 98.1 F (36.7 C) (Oral)   Resp 19   Ht 6\' 3"  (1.905 m)   Wt 121 kg   SpO2 98%   BMI 33.34 kg/m   General: 62 y.o. year-old male well developed well nourished in no acute distress.  Alert and oriented x3. Cardiovascular: Regular rate and rhythm with no rubs or gallops.  No thyromegaly or JVD noted.  No lower extremity edema. 2/4 pulses in all 4 extremities. Respiratory: Clear to auscultation with no wheezes or rales. Good inspiratory effort. Abdomen: Soft nontender nondistended with normal bowel sounds  x4 quadrants. Muskuloskeletal: No cyanosis, clubbing or edema noted bilaterally Neuro: CN II-XII intact, strength, sensation, reflexes Skin: Hematoma in the back of his head. Psychiatry: Judgement and insight appear normal. Mood is appropriate for condition and setting          Labs on Admission:  Basic Metabolic Panel: Recent Labs  Lab 07/10/23 1847  NA 137  K 4.7  CL 109  CO2 17*  GLUCOSE 173*  BUN 47*  CREATININE 2.01*  CALCIUM 10.1  MG 1.8   Liver Function Tests: Recent Labs  Lab 07/10/23 1847  AST 18  ALT 18  ALKPHOS 76  BILITOT 0.5  PROT 7.5  ALBUMIN 4.1   No results for input(s): "LIPASE", "AMYLASE" in the last 168 hours. No results for input(s): "AMMONIA" in the last 168 hours. CBC: Recent Labs  Lab 07/10/23 1847  WBC 9.3  NEUTROABS 5.6  HGB 13.0  HCT 39.1  MCV 92.0  PLT 309   Cardiac Enzymes: No results for input(s): "CKTOTAL", "CKMB", "CKMBINDEX", "TROPONINI" in the last 168 hours.  BNP (last 3 results) Recent Labs    07/10/23 1903  BNP 12.0    ProBNP (last 3 results) No results for input(s): "PROBNP" in the last 8760 hours.  CBG: No results for input(s): "GLUCAP" in the last 168 hours.  Radiological Exams on Admission: CT HEAD WO CONTRAST ( )  Result  Date: 07/10/2023 CLINICAL DATA:  Fall EXAM: CT HEAD WITHOUT CONTRAST CT CERVICAL SPINE WITHOUT CONTRAST TECHNIQUE: Multidetector CT imaging of the head and cervical spine was performed following the standard protocol without intravenous contrast. Multiplanar CT image reconstructions of the cervical spine were also generated. RADIATION DOSE REDUCTION: This exam was performed according to the departmental dose-optimization program which includes automated exposure control, adjustment of the mA and/or kV according to patient size and/or use of iterative reconstruction technique. COMPARISON:  None Available. FINDINGS: CT HEAD FINDINGS Brain: Trace hyperdensity along the anterior right frontal lobe (series 3, image 20 and series 6, image 26), most likely subarachnoid hemorrhage. No evidence of parenchymal hemorrhage. No acute infarct, mass, mass effect, or midline shift. No hydrocephalus. Vascular: No hyperdense vessel. Skull: Negative for fracture or focal lesion. Large left parietal and occipital hematoma. Small right frontal scalp/forehead hematoma. Sinuses/Orbits: Remote right lamina papyracea fracture. Mucosal thickening in the ethmoid air cells. Mucous retention cysts in the maxillary sinuses. No acute finding in the orbits. Other: The mastoid air cells are well aerated. CT CERVICAL SPINE FINDINGS Alignment: No traumatic listhesis. Skull base and vertebrae: No acute fracture or suspicious osseous lesion. Well corticated osseous fragment at the right aspect of C2 (series 4, image 44), likely sequela of a remote fracture. Soft tissues and spinal canal: No prevertebral fluid or swelling. No visible canal hematoma. Disc levels: Degenerative changes in the cervical spine. Moderate spinal canal stenosis at C5-C6. Severe spinal canal stenosis at C6-C7. Upper chest: Negative. IMPRESSION: 1. Trace subarachnoid hemorrhage along the anterior right frontal lobe. 2. Large left parietal and occipital scalp hematoma. Small right  frontal scalp/forehead hematoma. No evidence of calvarial fracture. 3. No acute fracture or traumatic listhesis in the cervical spine. 4. Severe spinal canal stenosis at C6-C7. Moderate spinal canal stenosis at C5-C6. These results were called by telephone at the time of interpretation on 07/10/2023 at 7:57 pm to provider [facility] , who verbally acknowledged these results. Electronically Signed   By: Wiliam Ke M.D.   On: 07/10/2023 19:57   CT CERVICAL SPINE WO CONTRAST  Result Date:  07/10/2023 CLINICAL DATA:  Fall EXAM: CT HEAD WITHOUT CONTRAST CT CERVICAL SPINE WITHOUT CONTRAST TECHNIQUE: Multidetector CT imaging of the head and cervical spine was performed following the standard protocol without intravenous contrast. Multiplanar CT image reconstructions of the cervical spine were also generated. RADIATION DOSE REDUCTION: This exam was performed according to the departmental dose-optimization program which includes automated exposure control, adjustment of the mA and/or kV according to patient size and/or use of iterative reconstruction technique. COMPARISON:  None Available. FINDINGS: CT HEAD FINDINGS Brain: Trace hyperdensity along the anterior right frontal lobe (series 3, image 20 and series 6, image 26), most likely subarachnoid hemorrhage. No evidence of parenchymal hemorrhage. No acute infarct, mass, mass effect, or midline shift. No hydrocephalus. Vascular: No hyperdense vessel. Skull: Negative for fracture or focal lesion. Large left parietal and occipital hematoma. Small right frontal scalp/forehead hematoma. Sinuses/Orbits: Remote right lamina papyracea fracture. Mucosal thickening in the ethmoid air cells. Mucous retention cysts in the maxillary sinuses. No acute finding in the orbits. Other: The mastoid air cells are well aerated. CT CERVICAL SPINE FINDINGS Alignment: No traumatic listhesis. Skull base and vertebrae: No acute fracture or suspicious osseous lesion. Well corticated osseous  fragment at the right aspect of C2 (series 4, image 44), likely sequela of a remote fracture. Soft tissues and spinal canal: No prevertebral fluid or swelling. No visible canal hematoma. Disc levels: Degenerative changes in the cervical spine. Moderate spinal canal stenosis at C5-C6. Severe spinal canal stenosis at C6-C7. Upper chest: Negative. IMPRESSION: 1. Trace subarachnoid hemorrhage along the anterior right frontal lobe. 2. Large left parietal and occipital scalp hematoma. Small right frontal scalp/forehead hematoma. No evidence of calvarial fracture. 3. No acute fracture or traumatic listhesis in the cervical spine. 4. Severe spinal canal stenosis at C6-C7. Moderate spinal canal stenosis at C5-C6. These results were called by telephone at the time of interpretation on 07/10/2023 at 7:57 pm to provider [place] , who verbally acknowledged these results. Electronically Signed   By: Wiliam Ke M.D.   On: 07/10/2023 19:57   DG Pelvis Portable  Result Date: 07/10/2023 CLINICAL DATA:  Fall, pelvic injury EXAM: PORTABLE PELVIS 1-2 VIEWS COMPARISON:  None Available. FINDINGS: The right hand overlies the right iliac crest slightly limiting evaluation of this region. Normal alignment. No fracture or dislocation. Sacroiliac and hip joint spaces are preserved. Soft tissues are unremarkable. IMPRESSION: 1. No fracture or dislocation. Electronically Signed   By: Helyn Numbers M.D.   On: 07/10/2023 19:16   DG Chest Portable 1 View  Result Date: 07/10/2023 CLINICAL DATA:  Fall EXAM: PORTABLE CHEST 1 VIEW COMPARISON:  10/05/2022 FINDINGS: Mild left basilar nodular infiltrate has developed possibly related to acute infection or aspiration. Lungs are otherwise clear. No pneumothorax. No pleural effusion. Cardiac size is within normal limits. No acute bone abnormality. IMPRESSION: 1. Mild left basilar nodular infiltrate, possibly infectious or inflammatory. Electronically Signed   By: Helyn Numbers M.D.   On:  07/10/2023 19:09    EKG: I independently viewed the EKG done and my findings are as followed: Sinus rhythm rate of 94.  Nonspecific ST-T changes.  QTc 414.  Assessment/Plan Present on Admission: **None**  Principal Problem:   SAH (subarachnoid hemorrhage) (HCC)  Subarachnoid hemorrhage status post fall with syncope Repeat noncontrast head CT and follow-up results As needed analgesics Neurosurgery consulted and following.  Appreciate assistance.  Syncope, unclear etiology, rule out cardiac etiology Continue IV fluid hydration Obtain orthostatic vital signs Obtain 2D echo PT OT assessment  Fall precautions Cardiology consulted to assist with the management.  AKI on CKD 3A Suspect multifactorial secondary to dehydration from diuretics Rule out post obstruction, bladder scan as needed if urine retention is suspected Baseline creatinine appears to be 1.3 with GFR 58 Presented with creatinine of 2.01 with GFR of 37 Avoid nephrotoxic agents, dehydration and hypotension Currently on NS at 75 cc/h x 1 day. Monitor urine output Repeat BMP in the morning  Non-anion gap metabolic acidosis secondary to acute renal insufficiency Presented with serum bicarb of 17 with anion gap of 11 Continue IV fluid hydration Repeat BMP in the morning.  Hypertension Initially blood pressure was elevated above baseline Had not taken his blood pressure medications the morning of his presentation Resume home oral antihypertensives Home losartan, chlorthalidone, and spironolactone are on hold due to AKI. Continue to closely monitor vital signs  Type 2 diabetes with hyperglycemia Obtain hemoglobin A1c Hold off home oral hypoglycemics Start heart healthy carb modified diet Insulin sliding scale.  Obesity BMI 33 Recommend weight loss outpatient with regular physical activity and healthy dieting.  History of renal cell carcinoma Follows up with urology outpatient   Time: 75 minutes.   DVT  prophylaxis: SCDs.  Code Status: Full code  Family Communication: Updated patient's wife at bedside  Disposition Plan: Admitted to telemetry medical unit  Consults called: Neurosurgery  Admission status: Observation status.   Status is: Observation    Darlin Drop MD [facility] Pager [phone]  If 7PM-7AM, please contact night-coverage [email or web address] Password [facility]  07/10/2023, 9:45 PM

## 2023-07-10 NOTE — Progress Notes (Signed)
Full consult note to follow. 62 y/o male presented to the ED after a ground level fall at [facility] when he fell backwards and hit his head on the tile floor. He is not on any anticoagulants. CTH was reviewed showing trace SAH along the anterior right frontal lobe, no fractures noted. Recommend repeat CTH tomorrow morning to make sure the [facility] does not progress / remains stable. No surgical management recommended at this time.   Emilee Hero, PA-C

## 2023-07-10 NOTE — ED Notes (Signed)
Ice applied to back of head.  

## 2023-07-10 NOTE — ED Notes (Signed)
ED TO INPATIENT HANDOFF REPORT  ED Nurse Name and Phone #:  Marcello Moores [phone]  S Name/Age/Gender Kristopher Mcalpine Sr. 62 y.o. male Room/Bed: 001C/001C  Code Status   Code Status: Full Code  Home/SNF/Other Home Patient oriented to: self, place, time, and situation Is this baseline? Yes   Triage Complete: Triage complete  Chief Complaint SAH (subarachnoid hemorrhage) (HCC) [I60.9]  Triage Note [facility] the ed [facility] with a CC of fall with loc. Pt was standing in line to order food when he fell straight back hit his head on tile. Pt has softball size hematoma to the back of his head. Pt was confused and combative when EMS arrived on scene.  Pt denies blood thinners, relays head  pain.   Allergies No Known Allergies  Level of Care/Admitting Diagnosis ED Disposition     ED Disposition  Admit   Condition  --   Comment  Hospital Area: [facility] [[id number]]  Level of Care: Telemetry Medical [104]  May place patient in observation at [facility] or [facility] if equivalent level of care is available:: No  Covid Evaluation: Asymptomatic - no recent exposure (last 10 days) testing not required  Diagnosis: SAH (subarachnoid hemorrhage) [facility]) [[id number]]  Admitting Physician: Darlin Drop [[id number]]  Attending Physician: Darlin Drop [[id number]]          B Medical/Surgery History Past Medical History:  Diagnosis Date   Ambulates with cane    Arthritis    CKD (chronic kidney disease)    stage III (nephrologist: Terrial Rhodes, MD) 03/2019   Diabetes mellitus without complication (HCC)    Type 2   GERD (gastroesophageal reflux disease)    Hyperlipidemia    Hypertension    Seasonal allergies    Past Surgical History:  Procedure Laterality Date   COLONOSCOPY     x2   HAND SURGERY Left 1983   KNEE ARTHROSCOPY Left 1986   MULTIPLE TOOTH EXTRACTIONS     RENAL ARTERY DOPPLERS  05/29/2017    Normal Renal A Bilaterally.  L > R kidney (3.2 cm) .  Renal Cysts noted  - larges ~3.7 cm x 3.5 cm (R kidney), L kidney - ~1.7 cm x 1.6 cm -> recommend referral to Nephrology   TOTAL KNEE ARTHROPLASTY Left 09/13/2019   TOTAL KNEE ARTHROPLASTY Left 09/13/2019   Procedure: LEFT TOTAL KNEE ARTHROPLASTY;  Surgeon: Cammy Copa, MD;  Location: [facility] OR;  Service: Orthopedics;  Laterality: Left;   TOTAL KNEE ARTHROPLASTY Right 04/05/2020   Procedure: RIGHT TOTAL KNEE ARTHROPLASTY;  Surgeon: Cammy Copa, MD;  Location: [facility] OR;  Service: Orthopedics;  Laterality: Right;   TRANSTHORACIC ECHOCARDIOGRAM  05/2017   Normal Echo. Mod Conc LVH. EF 55-60%.  No RWMA. Normal Valves. Gr 1 DD.   UPPER GI ENDOSCOPY     2x     A IV Location/Drains/Wounds Patient Lines/Drains/Airways Status     Active Line/Drains/Airways     Name Placement date Placement time Site Days   Peripheral IV 07/10/23 18 G Left;Posterior Forearm 07/10/23  --  Forearm  less than 1   Incision (Closed) 09/13/19 Knee Left 09/13/19  1453  -- 1396   Incision (Closed) 04/05/20 Knee Right 04/05/20  1253  -- 1191            Intake/Output Last 24 hours No intake or output data in the 24 hours ending 07/10/23 2151  Labs/Imaging Results for orders placed or performed during the hospital encounter  of 07/10/23 (from the past 48 hour(s))  CBC with Differential     Status: None   Collection Time: 07/10/23  6:47 PM  Result Value Ref Range   WBC 9.3 4.0 - 10.5 K/uL   RBC 4.25 4.22 - 5.81 MIL/uL   Hemoglobin 13.0 13.0 - 17.0 g/dL   HCT 16.1 09.6 - 04.5 %   MCV 92.0 80.0 - 100.0 fL   MCH 30.6 26.0 - 34.0 pg   MCHC 33.2 30.0 - 36.0 g/dL   RDW 40.9 81.1 - 91.4 %   Platelets 309 150 - 400 K/uL   nRBC 0.0 0.0 - 0.2 %   Neutrophils Relative % 59 %   Neutro Abs 5.6 1.7 - 7.7 K/uL   Lymphocytes Relative 28 %   Lymphs Abs 2.6 0.7 - 4.0 K/uL   Monocytes Relative 8 %   Monocytes Absolute 0.7 0.1 - 1.0 K/uL   Eosinophils Relative 3 %   Eosinophils Absolute 0.3 0.0 - 0.5 K/uL    Basophils Relative 1 %   Basophils Absolute 0.1 0.0 - 0.1 K/uL   Immature Granulocytes 1 %   Abs Immature Granulocytes 0.05 0.00 - 0.07 K/uL    Comment: Performed at [facility], [address], [place], [place] [address]  Comprehensive metabolic panel     Status: Abnormal   Collection Time: 07/10/23  6:47 PM  Result Value Ref Range   Sodium 137 135 - 145 mmol/L   Potassium 4.7 3.5 - 5.1 mmol/L   Chloride 109 98 - 111 mmol/L   CO2 17 (L) 22 - 32 mmol/L   Glucose, Bld 173 (H) 70 - 99 mg/dL    Comment: Glucose reference range applies only to samples taken after fasting for at least 8 hours.   BUN 47 (H) 8 - 23 mg/dL   Creatinine, Ser 6.21 (H) 0.61 - 1.24 mg/dL   Calcium 30.8 8.9 - 65.7 mg/dL   Total Protein 7.5 6.5 - 8.1 g/dL   Albumin 4.1 3.5 - 5.0 g/dL   AST 18 15 - 41 U/L   ALT 18 0 - 44 U/L   Alkaline Phosphatase 76 38 - 126 U/L   Total Bilirubin 0.5 0.3 - 1.2 mg/dL   GFR, Estimated 37 (L) >60 mL/min    Comment: (NOTE) Calculated using the CKD-EPI Creatinine Equation (2021)    Anion gap 11 5 - 15    Comment: Performed at [facility], [address], [place], [place] [address]  Troponin I (High Sensitivity)     Status: None   Collection Time: 07/10/23  6:47 PM  Result Value Ref Range   Troponin I (High Sensitivity) 6 <18 ng/L    Comment: (NOTE) Elevated high sensitivity troponin I (hsTnI) values and significant  changes across serial measurements may suggest ACS but many other  chronic and acute conditions are known to elevate hsTnI results.  Refer to the "Links" section for chest pain algorithms and additional  guidance. Performed at [facility], [address] [place], [place] [address]   Magnesium     Status: None   Collection Time: 07/10/23  6:47 PM  Result Value Ref Range   Magnesium 1.8 1.7 - 2.4 mg/dL    Comment: Performed at [facility], [address] [place], [place] [address]  Brain natriuretic peptide     Status: None    Collection Time: 07/10/23  7:03 PM  Result Value Ref Range   B Natriuretic  Peptide 12.0 0.0 - 100.0 pg/mL    Comment: Performed at [facility], [address], [place], [place] [address]   CT HEAD WO CONTRAST ( )  Result Date: 07/10/2023 CLINICAL DATA:  Fall EXAM: CT HEAD WITHOUT CONTRAST CT CERVICAL SPINE WITHOUT CONTRAST TECHNIQUE: Multidetector CT imaging of the head and cervical spine was performed following the standard protocol without intravenous contrast. Multiplanar CT image reconstructions of the cervical spine were also generated. RADIATION DOSE REDUCTION: This exam was performed according to the departmental dose-optimization program which includes automated exposure control, adjustment of the mA and/or kV according to patient size and/or use of iterative reconstruction technique. COMPARISON:  None Available. FINDINGS: CT HEAD FINDINGS Brain: Trace hyperdensity along the anterior right frontal lobe (series 3, image 20 and series 6, image 26), most likely subarachnoid hemorrhage. No evidence of parenchymal hemorrhage. No acute infarct, mass, mass effect, or midline shift. No hydrocephalus. Vascular: No hyperdense vessel. Skull: Negative for fracture or focal lesion. Large left parietal and occipital hematoma. Small right frontal scalp/forehead hematoma. Sinuses/Orbits: Remote right lamina papyracea fracture. Mucosal thickening in the ethmoid air cells. Mucous retention cysts in the maxillary sinuses. No acute finding in the orbits. Other: The mastoid air cells are well aerated. CT CERVICAL SPINE FINDINGS Alignment: No traumatic listhesis. Skull base and vertebrae: No acute fracture or suspicious osseous lesion. Well corticated osseous fragment at the right aspect of C2 (series 4, image 44), likely sequela of a remote fracture. Soft tissues and spinal canal: No prevertebral fluid or swelling. No visible canal hematoma. Disc levels: Degenerative changes in the cervical spine. Moderate spinal  canal stenosis at C5-C6. Severe spinal canal stenosis at C6-C7. Upper chest: Negative. IMPRESSION: 1. Trace subarachnoid hemorrhage along the anterior right frontal lobe. 2. Large left parietal and occipital scalp hematoma. Small right frontal scalp/forehead hematoma. No evidence of calvarial fracture. 3. No acute fracture or traumatic listhesis in the cervical spine. 4. Severe spinal canal stenosis at C6-C7. Moderate spinal canal stenosis at C5-C6. These results were called by telephone at the time of interpretation on 07/10/2023 at 7:57 pm to provider [facility] , who verbally acknowledged these results. Electronically Signed   By: Wiliam Ke M.D.   On: 07/10/2023 19:57   CT CERVICAL SPINE WO CONTRAST  Result Date: 07/10/2023 CLINICAL DATA:  Fall EXAM: CT HEAD WITHOUT CONTRAST CT CERVICAL SPINE WITHOUT CONTRAST TECHNIQUE: Multidetector CT imaging of the head and cervical spine was performed following the standard protocol without intravenous contrast. Multiplanar CT image reconstructions of the cervical spine were also generated. RADIATION DOSE REDUCTION: This exam was performed according to the departmental dose-optimization program which includes automated exposure control, adjustment of the mA and/or kV according to patient size and/or use of iterative reconstruction technique. COMPARISON:  None Available. FINDINGS: CT HEAD FINDINGS Brain: Trace hyperdensity along the anterior right frontal lobe (series 3, image 20 and series 6, image 26), most likely subarachnoid hemorrhage. No evidence of parenchymal hemorrhage. No acute infarct, mass, mass effect, or midline shift. No hydrocephalus. Vascular: No hyperdense vessel. Skull: Negative for fracture or focal lesion. Large left parietal and occipital hematoma. Small right frontal scalp/forehead hematoma. Sinuses/Orbits: Remote right lamina papyracea fracture. Mucosal thickening in the ethmoid air cells. Mucous retention cysts in the maxillary sinuses. No  acute finding in the orbits. Other: The mastoid air cells are well aerated. CT CERVICAL SPINE FINDINGS Alignment: No traumatic listhesis. Skull base and vertebrae: No acute fracture or suspicious osseous lesion. Well corticated osseous fragment at  the right aspect of C2 (series 4, image 44), likely sequela of a remote fracture. Soft tissues and spinal canal: No prevertebral fluid or swelling. No visible canal hematoma. Disc levels: Degenerative changes in the cervical spine. Moderate spinal canal stenosis at C5-C6. Severe spinal canal stenosis at C6-C7. Upper chest: Negative. IMPRESSION: 1. Trace subarachnoid hemorrhage along the anterior right frontal lobe. 2. Large left parietal and occipital scalp hematoma. Small right frontal scalp/forehead hematoma. No evidence of calvarial fracture. 3. No acute fracture or traumatic listhesis in the cervical spine. 4. Severe spinal canal stenosis at C6-C7. Moderate spinal canal stenosis at C5-C6. These results were called by telephone at the time of interpretation on 07/10/2023 at 7:57 pm to provider [facility] , who verbally acknowledged these results. Electronically Signed   By: Wiliam Ke M.D.   On: 07/10/2023 19:57   DG Pelvis Portable  Result Date: 07/10/2023 CLINICAL DATA:  Fall, pelvic injury EXAM: PORTABLE PELVIS 1-2 VIEWS COMPARISON:  None Available. FINDINGS: The right hand overlies the right iliac crest slightly limiting evaluation of this region. Normal alignment. No fracture or dislocation. Sacroiliac and hip joint spaces are preserved. Soft tissues are unremarkable. IMPRESSION: 1. No fracture or dislocation. Electronically Signed   By: Helyn Numbers M.D.   On: 07/10/2023 19:16   DG Chest Portable 1 View  Result Date: 07/10/2023 CLINICAL DATA:  Fall EXAM: PORTABLE CHEST 1 VIEW COMPARISON:  10/05/2022 FINDINGS: Mild left basilar nodular infiltrate has developed possibly related to acute infection or aspiration. Lungs are otherwise clear. No  pneumothorax. No pleural effusion. Cardiac size is within normal limits. No acute bone abnormality. IMPRESSION: 1. Mild left basilar nodular infiltrate, possibly infectious or inflammatory. Electronically Signed   By: Helyn Numbers M.D.   On: 07/10/2023 19:09    Pending Labs Unresulted Labs (From admission, onward)     Start     Ordered   07/11/23 0500  CBC  Tomorrow morning,   R        07/10/23 2149   07/11/23 0500  Basic metabolic panel  Tomorrow morning,   R        07/10/23 2149   07/11/23 0500  Phosphorus  Tomorrow morning,   R        07/10/23 2149   07/11/23 0500  Hemoglobin A1c  Tomorrow morning,   R       Comments: To assess prior glycemic control    07/10/23 2150   07/10/23 2141  HIV Antibody (routine testing w rflx)  (HIV Antibody (Routine testing w reflex) panel)  Once,   R        07/10/23 2140            Vitals/Pain Today's Vitals   07/10/23 1904 07/10/23 1904 07/10/23 2016 07/10/23 2043  BP:  (!) 140/62    Pulse: 96 93    Resp:  19    Temp:  98.1 F (36.7 C)    TempSrc:  Oral    SpO2: 100% 98%    Weight:      Height:      PainSc:   7  7     Isolation Precautions No active isolations  Medications Medications  acetaminophen (TYLENOL) tablet 650 mg (has no administration in time range)  prochlorperazine (COMPAZINE) injection 5 mg (has no administration in time range)  polyethylene glycol (MIRALAX / GLYCOLAX) packet 17 g (has no administration in time range)  melatonin tablet 5 mg (has no administration in time range)  oxyCODONE (Oxy  IR/ROXICODONE) immediate release tablet 5 mg (has no administration in time range)  0.9 %  sodium chloride infusion (has no administration in time range)  insulin aspart (novoLOG) injection 0-9 Units (has no administration in time range)  insulin aspart (novoLOG) injection 0-5 Units (has no administration in time range)  morphine (PF) 4 MG/ML injection 4 mg (4 mg Intravenous Given 07/10/23 2019)  lactated ringers bolus 1,000  mL (1,000 mLs Intravenous New Bag/Given 07/10/23 2116)    Mobility walks     Focused Assessments Neuro Assessment Handoff:   If patient is a Neuro Trauma and patient is going to OR before floor call report to 4N Charge nurse: [phone] or [phone]   R Recommendations: See Admitting Provider Note  Report given to:   Additional Notes:

## 2023-07-11 ENCOUNTER — Observation Stay (HOSPITAL_COMMUNITY): Payer: No Typology Code available for payment source

## 2023-07-11 DIAGNOSIS — I609 Nontraumatic subarachnoid hemorrhage, unspecified: Secondary | ICD-10-CM | POA: Diagnosis not present

## 2023-07-11 DIAGNOSIS — R55 Syncope and collapse: Secondary | ICD-10-CM | POA: Diagnosis not present

## 2023-07-11 DIAGNOSIS — E669 Obesity, unspecified: Secondary | ICD-10-CM | POA: Insufficient documentation

## 2023-07-11 DIAGNOSIS — E119 Type 2 diabetes mellitus without complications: Secondary | ICD-10-CM

## 2023-07-11 DIAGNOSIS — N179 Acute kidney failure, unspecified: Secondary | ICD-10-CM | POA: Insufficient documentation

## 2023-07-11 LAB — BASIC METABOLIC PANEL
Anion gap: 7 (ref 5–15)
BUN: 39 mg/dL — ABNORMAL HIGH (ref 8–23)
CO2: 18 mmol/L — ABNORMAL LOW (ref 22–32)
Calcium: 9.8 mg/dL (ref 8.9–10.3)
Chloride: 110 mmol/L (ref 98–111)
Creatinine, Ser: 1.66 mg/dL — ABNORMAL HIGH (ref 0.61–1.24)
GFR, Estimated: 46 mL/min — ABNORMAL LOW (ref >60)
Glucose, Bld: 161 mg/dL — ABNORMAL HIGH (ref 70–99)
Potassium: 4.7 mmol/L (ref 3.5–5.1)
Sodium: 135 mmol/L (ref 135–145)

## 2023-07-11 LAB — GLUCOSE, CAPILLARY
Glucose-Capillary: 151 mg/dL — ABNORMAL HIGH (ref 70–99)
Glucose-Capillary: 151 mg/dL — ABNORMAL HIGH (ref 70–99)
Glucose-Capillary: 203 mg/dL — ABNORMAL HIGH (ref 70–99)
Glucose-Capillary: 176 mg/dL — ABNORMAL HIGH (ref 70–99)
Glucose-Capillary: 205 mg/dL — ABNORMAL HIGH (ref 70–99)

## 2023-07-11 LAB — HEMOGLOBIN A1C
Hgb A1c MFr Bld: 7.8 % — ABNORMAL HIGH (ref 4.8–5.6)
Mean Plasma Glucose: 177.16 mg/dL

## 2023-07-11 LAB — ECHOCARDIOGRAM COMPLETE
Height: 75 in
S' Lateral: 3.1 cm
Weight: 4268.11 oz

## 2023-07-11 LAB — PHOSPHORUS: Phosphorus: 3.1 mg/dL (ref 2.5–4.6)

## 2023-07-11 LAB — CBC
HCT: 36.8 % — ABNORMAL LOW (ref 39.0–52.0)
Hemoglobin: 12.1 g/dL — ABNORMAL LOW (ref 13.0–17.0)
MCH: 30.2 pg (ref 26.0–34.0)
MCHC: 32.9 g/dL (ref 30.0–36.0)
MCV: 91.8 fL (ref 80.0–100.0)
Platelets: 281 10*3/uL (ref 150–400)
RBC: 4.01 MIL/uL — ABNORMAL LOW (ref 4.22–5.81)
RDW: 13.9 % (ref 11.5–15.5)
WBC: 11.6 10*3/uL — ABNORMAL HIGH (ref 4.0–10.5)
nRBC: 0 % (ref 0.0–0.2)

## 2023-07-11 MED ORDER — CARVEDILOL 12.5 MG PO TABS
12.5000 mg | ORAL_TABLET | Freq: Two times a day (BID) | ORAL | Status: DC
  Administered 2023-07-11 – 2023-07-12 (×2): 12.5 mg via ORAL
  Filled 2023-07-11 (×2): qty 1

## 2023-07-11 MED ORDER — ATORVASTATIN CALCIUM 10 MG PO TABS
10.0000 mg | ORAL_TABLET | Freq: Every day | ORAL | Status: DC
  Administered 2023-07-11 – 2023-07-12 (×2): 10 mg via ORAL
  Filled 2023-07-11 (×2): qty 1

## 2023-07-11 MED ORDER — CARVEDILOL 6.25 MG PO TABS
6.2500 mg | ORAL_TABLET | Freq: Two times a day (BID) | ORAL | Status: DC
  Administered 2023-07-11: 6.25 mg via ORAL
  Filled 2023-07-11: qty 1

## 2023-07-11 MED ORDER — LIDOCAINE 5 % EX PTCH
1.0000 | MEDICATED_PATCH | CUTANEOUS | Status: DC
  Filled 2023-07-11: qty 1

## 2023-07-11 MED ORDER — CHLORTHALIDONE 25 MG PO TABS
25.0000 mg | ORAL_TABLET | Freq: Every day | ORAL | Status: DC
  Filled 2023-07-11: qty 1

## 2023-07-11 MED ORDER — LOSARTAN POTASSIUM 50 MG PO TABS: 25.0000 mg | ORAL_TABLET | Freq: Every day | ORAL | Status: DC

## 2023-07-11 MED ORDER — NEBIVOLOL HCL 20 MG PO TABS: 20.0000 mg | ORAL_TABLET | Freq: Every day | ORAL | Status: DC

## 2023-07-11 NOTE — Evaluation (Signed)
Occupational Therapy Evaluation Patient Details Name: Kristopher ERICH Sr. MRN: [id number] DOB: [birth year] Today's Date: 07/11/2023   History of Present Illness Kristopher [name] a 62 y.o. man that presents with fall after syncope in [employer]. 7/12 head CT revealed trace SAH R frontal with frontal scalp hematoma, no fx evident. Repeat head CT 7/13 revealed stable SAH. PMHx notable for CKD3.   Clinical Impression   PTA pt lives independently with his wife and works as an Barrister's clerk. Pt states he feels "foggy". Scored WFL on the Short Blessed screen. Educated pt on signs/symptom management of concussion/mild TBI  - handout reviewed. No further OT needs.      Recommendations for follow up therapy are one component of a multi-disciplinary discharge planning process, led by the attending physician.  Recommendations may be updated based on patient status, additional functional criteria and insurance authorization.   Assistance Recommended at Discharge Set up Supervision/Assistance  Patient can return home with the following Direct supervision/assist for medications management    Functional Status Assessment  Patient has not had a recent decline in their functional status  Equipment Recommendations  None recommended by OT    Recommendations for Other Services       Precautions / Restrictions Precautions Precautions: Other (comment) (post concussive) Restrictions Weight Bearing Restrictions: No      Mobility Bed Mobility Overal bed mobility: Independent                  Transfers Overall transfer level: Modified independent                        Balance Overall balance assessment: Mild deficits observed, not formally tested                                         ADL either performed or assessed with clinical judgement   ADL Overall ADL's : At baseline                                              Vision Baseline Vision/History: 1 Wears glasses (reading) Vision Assessment?: No apparent visual deficits     Perception     Praxis      Pertinent Vitals/Pain       Hand Dominance Right   Extremity/Trunk Assessment Upper Extremity Assessment Upper Extremity Assessment: Overall WFL for tasks assessed   Lower Extremity Assessment Lower Extremity Assessment: Defer to PT evaluation   Cervical / Trunk Assessment Cervical / Trunk Assessment: Normal   Communication Communication Communication: No difficulties   Cognition Arousal/Alertness: Awake/alert Behavior During Therapy: WFL for tasks assessed/performed Overall Cognitive Status: Impaired/Different from baseline Area of Impairment: Problem solving, Attention                   Current Attention Level: Selective         Problem Solving: Slow processing General Comments: Scored WFL on the Short Blessed Screen; states he feels a little "foggy"; ablet o give answers to problem solving scenarios wiht min vc     General Comments  VSS    Exercises     Shoulder Instructions      Home Living Family/patient expects to be discharged to:: Private residence Living Arrangements: Spouse/significant other  Available Help at Discharge: Family Type of Home: House Home Access: Stairs to enter Secretary/administrator of Steps: 3 Entrance Stairs-Rails: Can reach both Home Layout: Multi-level Alternate Level Stairs-Number of Steps: 3 floors, bedroom on third floor - 2 flights to reach 3rd Alternate Level Stairs-Rails: Can reach both Bathroom Shower/Tub: Chief Strategy Officer: Standard Bathroom Accessibility: Yes   Home Equipment: Grab bars - tub/shower   Additional Comments: Pt reports they have a bed they could sleep in on the first floor if needed      Prior Functioning/Environment Prior Level of Function : Independent/Modified Independent;Working/employed Statistician)                         OT Problem List: Decreased cognition      OT Treatment/Interventions:      OT Goals(Current goals can be found in the care plan section) Acute Rehab OT Goals Patient Stated Goal: home OT Goal Formulation: All assessment and education complete, DC therapy  OT Frequency:      Co-evaluation              AM-PAC OT "6 Clicks" Daily Activity     Outcome Measure Help from another person eating meals?: None Help from another person taking care of personal grooming?: None Help from another person toileting, which includes using toliet, bedpan, or urinal?: None Help from another person bathing (including washing, rinsing, drying)?: None Help from another person to put on and taking off regular upper body clothing?: None Help from another person to put on and taking off regular lower body clothing?: None 6 Click Score: 24   End of Session Nurse Communication: Mobility status  Activity Tolerance: Patient tolerated treatment well Patient left: in chair;with chair alarm set;with family/visitor present;with call bell/phone within reach  OT Visit Diagnosis: Other symptoms and signs involving cognitive function                Time: 5409-8119 OT Time Calculation (min): 26 min Charges:  OT General Charges $OT Visit: 1 Visit OT Evaluation $OT Eval Low Complexity: 1 Low  Luisa Dago, OT/L   Acute OT Clinical Specialist Acute Rehabilitation Services Pager [phone] Office [phone]   [facility] 07/11/2023, 10:39 AM

## 2023-07-11 NOTE — Evaluation (Signed)
Physical Therapy Evaluation Patient Details Name: Kristopher [name] Sr. MRN: [id number] DOB: 12/29/1961 Today's Date: 07/11/2023  History of Present Illness  Kristopher [name] a 62 y.o. man that presents with fall after syncope in [employer]. 7/12 head CT revealed trace SAH R frontal with frontal scalp hematoma, no fx evident. Repeat head CT 7/13 revealed stable SAH. PMHx notable for CKD3.   Clinical Impression  PTA pt reports independence with functional mobility and ADL's, lives in multi-story home with spouse who is available to support, and works in [employer]. Today, pt with supervision for mobility, amb 200' with no AD, and completes stairs. Orthostatics collected prior to mobilization and negative; Supine w/ HOB elevated: 141/95 (108), Seated EOB: 152/105 (118), Standing 135/118 (124), and Post-Amb: 154/108 (123). Pt with some increased time for problem solving and processing, able to state 4/5 'B' lettered animals, and count backwards by 3 during ambulation with 2 errors. Reports feeling uneasy about returning to job at this time. Pt currently supervision or less with all mobility and no longer requires acute PT, will sign-off. Thank you for the consult.      Assistance Recommended at Discharge PRN  If plan is discharge home, recommend the following:  Can travel by private vehicle  A little help with walking and/or transfers;Assist for transportation;Help with stairs or ramp for entrance        Equipment Recommendations None recommended by PT  Recommendations for Other Services  OT consult    Functional Status Assessment Patient has not had a recent decline in their functional status     Precautions / Restrictions Precautions Precautions: Fall Restrictions Weight Bearing Restrictions: No      Mobility  Bed Mobility Overal bed mobility: Independent                  Transfers Overall transfer level: Needs assistance   Transfers: Sit  to/from Stand Sit to Stand: Supervision           General transfer comment: Increased time to steady upon stand. Uses BUE, no physical assist required besides superivison    Ambulation/Gait Ambulation/Gait assistance: Supervision Gait Distance (Feet): 200 Feet Assistive device: None Gait Pattern/deviations: Step-through pattern Gait velocity: reduced Gait velocity interpretation: 1.31 - 2.62 ft/sec, indicative of limited community ambulator   General Gait Details: no LOB, reduced speed  Stairs Stairs: Yes Stairs assistance: Supervision Stair Management: One rail Left, One rail Right, Step to pattern Number of Stairs: 3 General stair comments: Ascends with L hand rail and step-to, descends with R hand rail and step-to. No LOB.  Wheelchair Mobility     Tilt Bed    Modified Rankin (Stroke Patients Only)       Balance Overall balance assessment: Mild deficits observed, not formally tested                                           Pertinent Vitals/Pain Pain Assessment Pain Assessment: No/denies pain    Home Living Family/patient expects to be discharged to:: Private residence Living Arrangements: Spouse/significant other Available Help at Discharge: Family Type of Home: House Home Access: Stairs to enter Entrance Stairs-Rails: Can reach both Entrance Stairs-Number of Steps: 3 Alternate Level Stairs-Number of Steps: 3 floors, bedroom on third floor - 2 flights to reach 3rd Home Layout: Multi-level Home Equipment: Grab bars - tub/shower Additional Comments: Pt reports they have a  bed they could sleep in on the first floor if needed    Prior Function Prior Level of Function : Independent/Modified Independent;Working/employed                     Hand Dominance        Extremity/Trunk Assessment   Upper Extremity Assessment Upper Extremity Assessment: Overall WFL for tasks assessed;Defer to OT evaluation    Lower Extremity  Assessment Lower Extremity Assessment: Overall WFL for tasks assessed    Cervical / Trunk Assessment Cervical / Trunk Assessment: Normal  Communication   Communication: No difficulties  Cognition Arousal/Alertness: Awake/alert Behavior During Therapy: WFL for tasks assessed/performed Overall Cognitive Status: Impaired/Different from baseline Area of Impairment: Problem solving                             Problem Solving: Slow processing General Comments: Increased time for naming 'B' letter animals,        General Comments General comments (skin integrity, edema, etc.): VSS on RA. Orthostatics collected and negative; Supine with HOB elevated:141/95 (108), Seated EOB: 152/105 (118), Standing: 135/118 (124), and Post-Amb: 154/108 (123).    Exercises     Assessment/Plan    PT Assessment Patient does not need any further PT services  PT Problem List         PT Treatment Interventions      PT Goals (Current goals can be found in the Care Plan section)  Acute Rehab PT Goals Patient Stated Goal: stop being dizzy PT Goal Formulation: All assessment and education complete, DC therapy (Simultaneous filing. User may not have seen previous data.)    Frequency       Co-evaluation               AM-PAC PT "6 Clicks" Mobility  Outcome Measure Help needed turning from your back to your side while in a flat bed without using bedrails?: None Help needed moving from lying on your back to sitting on the side of a flat bed without using bedrails?: None Help needed moving to and from a bed to a chair (including a wheelchair)?: A Little Help needed standing up from a chair using your arms (e.g., wheelchair or bedside chair)?: A Little Help needed to walk in hospital room?: A Little Help needed climbing 3-5 steps with a railing? : A Little 6 Click Score: 20    End of Session Equipment Utilized During Treatment: Gait belt Activity Tolerance: Patient tolerated  treatment well Patient left: in chair;with call bell/phone within reach;with chair alarm set Nurse Communication: Mobility status PT Visit Diagnosis: Unsteadiness on feet (R26.81)    Time: 1610-9604 PT Time Calculation (min) (ACUTE ONLY): 30 min   Charges:   PT Evaluation $PT Eval Low Complexity: 1 Low PT Treatments $Therapeutic Activity: 8-22 mins PT General Charges $$ ACUTE PT VISIT: 1 Visit         Hendricks Milo, SPT  Acute Rehabilitation Services   Hendricks Milo 07/11/2023, 10:17 AM

## 2023-07-11 NOTE — Consult Note (Addendum)
Neurosurgery Consultation  Reason for Consult: traumatic subarachnoid hemorrhage Referring Physician: Alvino Chapel  CC: Fall  HPI: This is a 62 y.o. man that presents with fall after syncope. Endorses some headache, feeling of "swimminess" consistent with a little bit of vertiginous symptoms more than a cognitive issue or foggy feeling, but he was up all night after the CT at 4am so he's also pretty tired. Syncope workup being done by the primary team. No new weakness, numbness, or parasthesias. No recent use of anti-platelet or anti-coagulant medications. PMHx notable for CKD3   ROS: A 14 point ROS was performed and is negative except as noted in the HPI.   PMHx:  Past Medical History:  Diagnosis Date   Ambulates with cane    Arthritis    CKD (chronic kidney disease)    stage III (nephrologist: Terrial Rhodes, MD) 03/2019   Diabetes mellitus without complication (HCC)    Type 2   GERD (gastroesophageal reflux disease)    Hyperlipidemia    Hypertension    Seasonal allergies    FamHx:  Family History  Problem Relation Age of Onset   Cancer Mother    Cancer Father    Cancer Sister    Hypertension Sister    Cancer Brother    Hypertension Brother    Cancer Sister    Hypertension Sister    SocHx:  reports that he has never smoked. He has never used smokeless tobacco. He reports current alcohol use. He reports that he does not use drugs.  Exam: Vital signs in last 24 hours: Temp:  [98.1 F (36.7 C)-98.5 F (36.9 C)] 98.5 F (36.9 C) (07/13 0818) Pulse Rate:  [70-96] 77 (07/13 0818) Resp:  [13-22] 13 (07/13 0818) BP: (135-165)/(62-127) 154/108 (07/13 0818) SpO2:  [95 %-100 %] 98 % (07/13 0818) Weight:  [161 kg] 121 kg (07/12 1852) General: Awake, alert, cooperative, sitting in chair at bedside NAD Head: Normocephalic, scalp hematoma palpable in the left occipital region HEENT: Neck supple Pulmonary: breathing room air comfortably, no evidence of increased work of  breathing Cardiac: RRR Abdomen: S NT ND Extremities: Warm and well perfused x4 Neuro: AOx3, PERRL, EOMI, FS Strength 5/5 x4, SILTx4, speech fluent with normal content   Assessment and Plan: 62 y.o. man s/p fall 2/2 syncope. [facility] personally reviewed, which shows bifrontal traumatic SAH.   -repeat CT head with stable findings, no need for scheduled repeat CT -no neurosurgical intervention indicated at this time -okay for DVT chemoprophylaxis 48h post-trauma -stable for discharge from a neurosurgery standpoint pending the rest of his medical issues -follow up with me in 2 weeks in clinic to make sure he's progressing well, he can call [phone] for a follow up appointment -discussed common post-concussive symptoms, if the vertiginous symptoms are new then I suspect they're related to that, which we discussed -please call with any concerns or questions  Jadene Pierini, MD 07/11/23 9:02 AM [facility]

## 2023-07-11 NOTE — Progress Notes (Signed)
  Echocardiogram 2D Echocardiogram has been performed.  Delcie Roch 07/11/2023, 12:52 PM

## 2023-07-11 NOTE — Plan of Care (Signed)
  Problem: Education: Goal: Ability to describe self-care measures that may prevent or decrease complications (Diabetes Survival Skills Education) will improve Outcome: Progressing   Problem: Coping: Goal: Ability to adjust to condition or change in health will improve Outcome: Progressing   Problem: Fluid Volume: Goal: Ability to maintain a balanced intake and output will improve Outcome: Progressing   Problem: Health Behavior/Discharge Planning: Goal: Ability to identify and utilize available resources and services will improve Outcome: Progressing Goal: Ability to manage health-related needs will improve Outcome: Progressing   Problem: Metabolic: Goal: Ability to maintain appropriate glucose levels will improve Outcome: Progressing   Problem: Nutritional: Goal: Maintenance of adequate nutrition will improve Outcome: Progressing   Problem: Clinical Measurements: Goal: Ability to maintain clinical measurements within normal limits will improve Outcome: Progressing Goal: Will remain free from infection Outcome: Progressing Goal: Diagnostic test results will improve Outcome: Progressing Goal: Respiratory complications will improve Outcome: Progressing Goal: Cardiovascular complication will be avoided Outcome: Progressing   Problem: Skin Integrity: Goal: Risk for impaired skin integrity will decrease Outcome: Progressing

## 2023-07-11 NOTE — Progress Notes (Signed)
PROGRESS NOTE    Kristopher RENNEY Sr.  OZH:[id number] DOB: 02/02/1961 DOA: 07/10/2023 PCP: Jonathon Resides     Brief Narrative:  Kristopher Wittenborn. is a 62 y.o. male with medical history significant for renal cell carcinoma, BPH with obstruction, hypertension, obesity, who presents after a fall with loss of consciousness.  The patient was at a restaurant and was standing in line.  States he accidentally dropped his wallet on the ground and reach down to pick it up.  When he got up he does not remember how he ended up on the ground.  States he found himself on tile.  Denies palpitations or chest pain prior to his fall.  Denies history of frequent falls but admits to intermittent lightheadedness recently.  Denies any anginal symptoms or shortness of breath.  Unclear how long he was unconscious.  No seizure-like activity or postictal symptoms reported.  No recent chills or subjective fevers.  EMS was activated.  In the emergency department, CT head revealed trace subarachnoid hemorrhage as well as large left parietal and occipital scalp hematoma.  Neurosurgery was consulted who recommended repeat head CT in the morning.  Patient was admitted for further observation and workup for syncope.  New events last 24 hours / Subjective: Patient feeling well this morning, sitting in chair.  He states that he worked with physical therapy.  Orthostatic vital sign was negative today.  He states that on day of syncopal episode, he did not take his antihypertensives that morning.  He states that he did not have any tunnel vision, nausea or diaphoresis and is unsure what happened before he had the syncopal episode.  This morning, patient is feeling well.  Assessment & Plan:   Principal Problem:   SAH (subarachnoid hemorrhage) (HCC) Active Problems:   Benign essential hypertension   Syncope   AKI (acute kidney injury) (HCC)   DM (diabetes mellitus) (HCC)   Obesity (BMI 30-39.9)   Subarachnoid hemorrhage  status post syncope -Repeat CT head stable -Appreciate neurosurgery. Follow up with Dr. Johnsie Cancel in 2 weeks  Syncope -Unclear etiology -Orthostatic vital sign was negative -Echocardiogram is pending -Continue telemetry  AKI on CKD stage IIIa -Baseline creatinine 1.3 -Improving creatinine -Continue IV fluid  Hypertension -Coreg -Losartan, chlorthalidone, spironolactone on hold due to AKI  Diabetes mellitus -A1c 7.8 -Holding home oral medication -Sliding scale insulin  Obesity -BMI 33  History of renal cell carcinoma -Follow-up outpatient  DVT prophylaxis:  SCDs Start: 07/10/23 2141  Code Status: Full code Family Communication: None at bedside Disposition Plan: Home Status is: Observation The patient will require care spanning > 2 midnights and should be moved to inpatient because: Echocardiogram is pending.  Continue IV fluid    Antimicrobials:  Anti-infectives (From admission, onward)    None        Objective: Vitals:   07/11/23 0806 07/11/23 0808 07/11/23 0818 07/11/23 1114  BP: (!) 152/105 (!) 135/118 (!) 154/108 (!) 150/104  Pulse: 79 77 77 68  Resp: 18 16 13 15   Temp:   98.5 F (36.9 C) 98.4 F (36.9 C)  TempSrc:   Oral Oral  SpO2: 96% 97% 98% 97%  Weight:      Height:        Intake/Output Summary (Last 24 hours) at 07/11/2023 1209 Last data filed at 07/11/2023 0300 Gross per 24 hour  Intake 1000.91 ml  Output 500 ml  Net 500.91 ml   Filed Weights   07/10/23 1852  Weight: 121 kg  Examination:  General exam: Appears calm and comfortable  Respiratory system: Clear to auscultation. Respiratory effort normal. No respiratory distress. No conversational dyspnea.  Cardiovascular system: S1 & S2 heard, RRR. No murmurs. No pedal edema. Gastrointestinal system: Abdomen is nondistended, soft and nontender. Normal bowel sounds heard. Central nervous system: Alert and oriented. No focal neurological deficits. Speech clear.  CN II through XII  grossly intact, strength equal bilaterally Extremities: Symmetric in appearance  Skin: No rashes, lesions or ulcers on exposed skin  Psychiatry: Judgement and insight appear normal. Mood & affect appropriate.   Data Reviewed: I have personally reviewed following labs and imaging studies  CBC: Recent Labs  Lab 07/10/23 1847 07/11/23 0415  WBC 9.3 11.6*  NEUTROABS 5.6  --   HGB 13.0 12.1*  HCT 39.1 36.8*  MCV 92.0 91.8  PLT 309 281   Basic Metabolic Panel: Recent Labs  Lab 07/10/23 1847 07/11/23 0415  NA 137 135  K 4.7 4.7  CL 109 110  CO2 17* 18*  GLUCOSE 173* 161*  BUN 47* 39*  CREATININE 2.01* 1.66*  CALCIUM 10.1 9.8  MG 1.8  --   PHOS  --  3.1   GFR: Estimated Creatinine Clearance: 63.8 mL/min (A) (by C-G formula based on SCr of 1.66 mg/dL (H)). Liver Function Tests: Recent Labs  Lab 07/10/23 1847  AST 18  ALT 18  ALKPHOS 76  BILITOT 0.5  PROT 7.5  ALBUMIN 4.1   No results for input(s): "LIPASE", "AMYLASE" in the last 168 hours. No results for input(s): "AMMONIA" in the last 168 hours. Coagulation Profile: No results for input(s): "INR", "PROTIME" in the last 168 hours. Cardiac Enzymes: No results for input(s): "CKTOTAL", "CKMB", "CKMBINDEX", "TROPONINI" in the last 168 hours. BNP (last 3 results) No results for input(s): "PROBNP" in the last 8760 hours. HbA1C: Recent Labs    07/11/23 0415  HGBA1C 7.8*   CBG: Recent Labs  Lab 07/10/23 2159 07/11/23 0730 07/11/23 0758 07/11/23 1115  GLUCAP 111* 151* 151* 203*   Lipid Profile: No results for input(s): "CHOL", "HDL", "LDLCALC", "TRIG", "CHOLHDL", "LDLDIRECT" in the last 72 hours. Thyroid Function Tests: No results for input(s): "TSH", "T4TOTAL", "FREET4", "T3FREE", "THYROIDAB" in the last 72 hours. Anemia Panel: No results for input(s): "VITAMINB12", "FOLATE", "FERRITIN", "TIBC", "IRON", "RETICCTPCT" in the last 72 hours. Sepsis Labs: No results for input(s): "PROCALCITON", "LATICACIDVEN"  in the last 168 hours.  No results found for this or any previous visit (from the past 240 hour(s)).    Radiology Studies: CT HEAD WO CONTRAST ( )  Result Date: 07/11/2023 CLINICAL DATA:  62 year old male status post fall with trace right anterior frontal convexity subarachnoid hemorrhage. EXAM: CT HEAD WITHOUT CONTRAST TECHNIQUE: Contiguous axial images were obtained from the base of the skull through the vertex without intravenous contrast. RADIATION DOSE REDUCTION: This exam was performed according to the departmental dose-optimization program which includes automated exposure control, adjustment of the mA and/or kV according to patient size and/or use of iterative reconstruction technique. COMPARISON:  Head CT yesterday. FINDINGS: Brain: No IVH or ventriculomegaly. But ongoing evidence of trace right anterior frontal pole subarachnoid hemorrhage (coronal image 15). No discrete cerebral hemorrhagic contusion. No subdural or other extra-axial blood. No associated cerebral edema. No intracranial mass effect. Stable gray-white matter differentiation throughout the brain. No cortically based acute infarct identified. Vascular: Mild Calcified atherosclerosis at the skull base. No suspicious intracranial vascular hyperdensity. Skull: Stable.  No acute osseous abnormality identified. Sinuses/Orbits: Visualized paranasal sinuses and mastoids are stable  and well aerated. Other: Progressed and relatively large posterior convexity scalp hematoma eccentric to the left. No scalp soft tissue gas is identified. No underlying calvarium fracture identified. IMPRESSION: 1. Stable trace right anterior frontal convexity SAH. No intracranial mass effect or new intracranial hemorrhage. 2. Progressed and relatively large scalp hematoma. No skull fracture identified. Electronically Signed   By: Odessa Fleming M.D.   On: 07/11/2023 04:45   CT HEAD WO CONTRAST ( )  Result Date: 07/10/2023 CLINICAL DATA:  Fall EXAM: CT HEAD  WITHOUT CONTRAST CT CERVICAL SPINE WITHOUT CONTRAST TECHNIQUE: Multidetector CT imaging of the head and cervical spine was performed following the standard protocol without intravenous contrast. Multiplanar CT image reconstructions of the cervical spine were also generated. RADIATION DOSE REDUCTION: This exam was performed according to the departmental dose-optimization program which includes automated exposure control, adjustment of the mA and/or kV according to patient size and/or use of iterative reconstruction technique. COMPARISON:  None Available. FINDINGS: CT HEAD FINDINGS Brain: Trace hyperdensity along the anterior right frontal lobe (series 3, image 20 and series 6, image 26), most likely subarachnoid hemorrhage. No evidence of parenchymal hemorrhage. No acute infarct, mass, mass effect, or midline shift. No hydrocephalus. Vascular: No hyperdense vessel. Skull: Negative for fracture or focal lesion. Large left parietal and occipital hematoma. Small right frontal scalp/forehead hematoma. Sinuses/Orbits: Remote right lamina papyracea fracture. Mucosal thickening in the ethmoid air cells. Mucous retention cysts in the maxillary sinuses. No acute finding in the orbits. Other: The mastoid air cells are well aerated. CT CERVICAL SPINE FINDINGS Alignment: No traumatic listhesis. Skull base and vertebrae: No acute fracture or suspicious osseous lesion. Well corticated osseous fragment at the right aspect of C2 (series 4, image 44), likely sequela of a remote fracture. Soft tissues and spinal canal: No prevertebral fluid or swelling. No visible canal hematoma. Disc levels: Degenerative changes in the cervical spine. Moderate spinal canal stenosis at C5-C6. Severe spinal canal stenosis at C6-C7. Upper chest: Negative. IMPRESSION: 1. Trace subarachnoid hemorrhage along the anterior right frontal lobe. 2. Large left parietal and occipital scalp hematoma. Small right frontal scalp/forehead hematoma. No evidence of  calvarial fracture. 3. No acute fracture or traumatic listhesis in the cervical spine. 4. Severe spinal canal stenosis at C6-C7. Moderate spinal canal stenosis at C5-C6. These results were called by telephone at the time of interpretation on 07/10/2023 at 7:57 pm to provider [facility] , who verbally acknowledged these results. Electronically Signed   By: Wiliam Ke M.D.   On: 07/10/2023 19:57   CT CERVICAL SPINE WO CONTRAST  Result Date: 07/10/2023 CLINICAL DATA:  Fall EXAM: CT HEAD WITHOUT CONTRAST CT CERVICAL SPINE WITHOUT CONTRAST TECHNIQUE: Multidetector CT imaging of the head and cervical spine was performed following the standard protocol without intravenous contrast. Multiplanar CT image reconstructions of the cervical spine were also generated. RADIATION DOSE REDUCTION: This exam was performed according to the departmental dose-optimization program which includes automated exposure control, adjustment of the mA and/or kV according to patient size and/or use of iterative reconstruction technique. COMPARISON:  None Available. FINDINGS: CT HEAD FINDINGS Brain: Trace hyperdensity along the anterior right frontal lobe (series 3, image 20 and series 6, image 26), most likely subarachnoid hemorrhage. No evidence of parenchymal hemorrhage. No acute infarct, mass, mass effect, or midline shift. No hydrocephalus. Vascular: No hyperdense vessel. Skull: Negative for fracture or focal lesion. Large left parietal and occipital hematoma. Small right frontal scalp/forehead hematoma. Sinuses/Orbits: Remote right lamina papyracea fracture. Mucosal thickening in the ethmoid  air cells. Mucous retention cysts in the maxillary sinuses. No acute finding in the orbits. Other: The mastoid air cells are well aerated. CT CERVICAL SPINE FINDINGS Alignment: No traumatic listhesis. Skull base and vertebrae: No acute fracture or suspicious osseous lesion. Well corticated osseous fragment at the right aspect of C2 (series 4,  image 44), likely sequela of a remote fracture. Soft tissues and spinal canal: No prevertebral fluid or swelling. No visible canal hematoma. Disc levels: Degenerative changes in the cervical spine. Moderate spinal canal stenosis at C5-C6. Severe spinal canal stenosis at C6-C7. Upper chest: Negative. IMPRESSION: 1. Trace subarachnoid hemorrhage along the anterior right frontal lobe. 2. Large left parietal and occipital scalp hematoma. Small right frontal scalp/forehead hematoma. No evidence of calvarial fracture. 3. No acute fracture or traumatic listhesis in the cervical spine. 4. Severe spinal canal stenosis at C6-C7. Moderate spinal canal stenosis at C5-C6. These results were called by telephone at the time of interpretation on 07/10/2023 at 7:57 pm to provider [facility] , who verbally acknowledged these results. Electronically Signed   By: Wiliam Ke M.D.   On: 07/10/2023 19:57   DG Pelvis Portable  Result Date: 07/10/2023 CLINICAL DATA:  Fall, pelvic injury EXAM: PORTABLE PELVIS 1-2 VIEWS COMPARISON:  None Available. FINDINGS: The right hand overlies the right iliac crest slightly limiting evaluation of this region. Normal alignment. No fracture or dislocation. Sacroiliac and hip joint spaces are preserved. Soft tissues are unremarkable. IMPRESSION: 1. No fracture or dislocation. Electronically Signed   By: Helyn Numbers M.D.   On: 07/10/2023 19:16   DG Chest Portable 1 View  Result Date: 07/10/2023 CLINICAL DATA:  Fall EXAM: PORTABLE CHEST 1 VIEW COMPARISON:  10/05/2022 FINDINGS: Mild left basilar nodular infiltrate has developed possibly related to acute infection or aspiration. Lungs are otherwise clear. No pneumothorax. No pleural effusion. Cardiac size is within normal limits. No acute bone abnormality. IMPRESSION: 1. Mild left basilar nodular infiltrate, possibly infectious or inflammatory. Electronically Signed   By: Helyn Numbers M.D.   On: 07/10/2023 19:09      Scheduled Meds:   atorvastatin  10 mg Oral Daily   carvedilol  12.5 mg Oral BID WC   insulin aspart  0-5 Units Subcutaneous QHS   insulin aspart  0-9 Units Subcutaneous TID WC   Continuous Infusions:  sodium chloride 75 mL/hr at 07/10/23 2224     LOS: 0 days   Time spent: 35 minutes   Noralee Stain, DO [facility] 07/11/2023, 12:09 PM   Available via Epic secure chat 7am-7pm After these hours, please refer to coverage provider listed on [email or web address]

## 2023-07-11 NOTE — Progress Notes (Signed)
Pt left for CT scan

## 2023-07-12 ENCOUNTER — Other Ambulatory Visit: Payer: Self-pay | Admitting: Cardiology

## 2023-07-12 ENCOUNTER — Observation Stay (HOSPITAL_COMMUNITY): Payer: No Typology Code available for payment source

## 2023-07-12 DIAGNOSIS — R55 Syncope and collapse: Secondary | ICD-10-CM

## 2023-07-12 DIAGNOSIS — I609 Nontraumatic subarachnoid hemorrhage, unspecified: Secondary | ICD-10-CM | POA: Diagnosis not present

## 2023-07-12 LAB — CBC
HCT: 39.5 % (ref 39.0–52.0)
Hemoglobin: 13 g/dL (ref 13.0–17.0)
MCH: 30.2 pg (ref 26.0–34.0)
MCHC: 32.9 g/dL (ref 30.0–36.0)
MCV: 91.6 fL (ref 80.0–100.0)
Platelets: 263 10*3/uL (ref 150–400)
RBC: 4.31 MIL/uL (ref 4.22–5.81)
RDW: 13.8 % (ref 11.5–15.5)
WBC: 9.4 10*3/uL (ref 4.0–10.5)
nRBC: 0 % (ref 0.0–0.2)

## 2023-07-12 LAB — GLUCOSE, CAPILLARY
Glucose-Capillary: 140 mg/dL — ABNORMAL HIGH (ref 70–99)
Glucose-Capillary: 204 mg/dL — ABNORMAL HIGH (ref 70–99)

## 2023-07-12 LAB — BASIC METABOLIC PANEL
Anion gap: 8 (ref 5–15)
BUN: 31 mg/dL — ABNORMAL HIGH (ref 8–23)
CO2: 20 mmol/L — ABNORMAL LOW (ref 22–32)
Calcium: 10.1 mg/dL (ref 8.9–10.3)
Chloride: 107 mmol/L (ref 98–111)
Creatinine, Ser: 1.54 mg/dL — ABNORMAL HIGH (ref 0.61–1.24)
GFR, Estimated: 50 mL/min — ABNORMAL LOW (ref >60)
Glucose, Bld: 152 mg/dL — ABNORMAL HIGH (ref 70–99)
Potassium: 4.4 mmol/L (ref 3.5–5.1)
Sodium: 135 mmol/L (ref 135–145)

## 2023-07-12 NOTE — Discharge Summary (Signed)
Physician Discharge Summary  Kristopher COMERFORD Sr. EXN:[id number] DOB: 04-24-1961 DOA: 07/10/2023  PCP: Jonathon Resides  Admit date: 07/10/2023 Discharge date: 07/12/2023  Admitted From: Home Disposition: Home  Recommendations for Outpatient Follow-up:  Follow up with PCP in 1 week Follow up with cardiology for cardiac monitor for syncope workup, 8/9  Follow-up with neurosurgery, Dr. Johnsie Cancel in 2 weeks  Discharge Condition: Stable CODE STATUS: Full code Diet recommendation: Heart healthy diet  Brief/Interim Summary: Kristopher DUQUE Sr. is a 62 y.o. male with medical history significant for renal cell carcinoma, BPH with obstruction, hypertension, obesity, who presents after a fall with loss of consciousness.  The patient was at a restaurant and was standing in line.  States he accidentally dropped his wallet on the ground and reach down to pick it up.  When he got up he does not remember how he ended up on the ground.  States he found himself on tile.  Denies palpitations or chest pain prior to his fall.  Denies history of frequent falls but admits to intermittent lightheadedness recently.  Denies any anginal symptoms or shortness of breath.  Unclear how long he was unconscious.  No seizure-like activity or postictal symptoms reported.  No recent chills or subjective fevers.  EMS was activated.  In the emergency department, CT head revealed trace subarachnoid hemorrhage as well as large left parietal and occipital scalp hematoma.  Neurosurgery was consulted who recommended repeat head CT in the morning.  Patient was admitted for further observation and workup for syncope.   Repeat CT head was stable.  Workup for syncope was overall unremarkable, orthostatic vital sign was negative, echocardiogram without severe AS.  Carotid ultrasounds negative.  Patient was set up for outpatient cardiac monitor.  He was discharged home in stable and improved condition.  Discharge Diagnoses:   Principal  Problem:   SAH (subarachnoid hemorrhage) (HCC) Active Problems:   Benign essential hypertension   Syncope   AKI (acute kidney injury) (HCC)   DM (diabetes mellitus) (HCC)   Obesity (BMI 30-39.9)    Subarachnoid hemorrhage status post syncope -Repeat CT head stable -Appreciate neurosurgery. Follow up with Dr. Johnsie Cancel in 2 weeks   Syncope -Unclear etiology -Orthostatic vital sign was negative -Echocardiogram negative for severe AS -Carotid ultrasound unremarkable -Follow-up with outpatient cardiac monitor   AKI on CKD stage IIIa -Baseline creatinine 1.3 -Improved   Hypertension -Coreg -Losartan, chlorthalidone, spironolactone on hold due to AKI, follow-up outpatient to resume   Diabetes mellitus -A1c 7.8 -Resume home medications   Obesity -BMI 33   History of renal cell carcinoma -Follow-up outpatient  Discharge Instructions  Discharge Instructions     Call MD for:  difficulty breathing, headache or visual disturbances   Complete by: As directed    Call MD for:  extreme fatigue   Complete by: As directed    Call MD for:  persistant dizziness or light-headedness   Complete by: As directed    Call MD for:  persistant nausea and vomiting   Complete by: As directed    Call MD for:  severe uncontrolled pain   Complete by: As directed    Call MD for:  temperature >100.4   Complete by: As directed    Diet - low sodium heart healthy   Complete by: As directed    Discharge instructions   Complete by: As directed    You were cared for by a hospitalist during your hospital stay. If you have any questions about your discharge  medications or the care you received while you were in the hospital after you are discharged, you can call the unit and ask to speak with the hospitalist on call if the hospitalist that took care of you is not available. Once you are discharged, your primary care physician will handle any further medical issues. Please note that NO REFILLS for  any discharge medications will be authorized once you are discharged, as it is imperative that you return to your primary care physician (or establish a relationship with a primary care physician if you do not have one) for your aftercare needs so that they can reassess your need for medications and monitor your lab values.   Increase activity slowly   Complete by: As directed       Allergies as of 07/12/2023   No Known Allergies      Medication List     STOP taking these medications    Bystolic 20 MG Tabs Generic drug: Nebivolol HCl   chlorthalidone 25 MG tablet Commonly known as: HYGROTON   Klor-Con M10 10 MEQ tablet Generic drug: potassium chloride   losartan 100 MG tablet Commonly known as: COZAAR   spironolactone 100 MG tablet Commonly known as: ALDACTONE       TAKE these medications    allopurinol 100 MG tablet Commonly known as: ZYLOPRIM Take 100 mg by mouth 2 (two) times daily.   ALPRAZolam 0.5 MG tablet Commonly known as: XANAX Take 0.5 mg by mouth once.   atorvastatin 10 MG tablet Commonly known as: LIPITOR Take 10 mg by mouth daily.   carvedilol 25 MG tablet Commonly known as: COREG Take 12.5 mg by mouth 2 (two) times daily with a meal.   [employer] Aspirin Adult Low Dose 81 MG chewable tablet Generic drug: aspirin CHEW 1 TABLET (81 MG TOTAL) BY MOUTH DAILY.   cyclobenzaprine 5 MG tablet Commonly known as: FLEXERIL Take 5 mg by mouth See admin instructions. Take 5 mg (1 tablet) by mouth at bedtime as needed for muscle spasms. May repeat once as directed.   empagliflozin 10 MG Tabs tablet Commonly known as: JARDIANCE Take 10 mg by mouth daily.   glipiZIDE 5 MG tablet Commonly known as: GLUCOTROL Take 5 mg by mouth daily before breakfast.   glipiZIDE 5 MG tablet Commonly known as: GLUCOTROL Take 5-10 mg by mouth See admin instructions. Take 10 mg (2 tablets) by mouth every morning and 5 mg (1 tablet) every evening.   metFORMIN 1000 MG  tablet Commonly known as: GLUCOPHAGE Take 1,000 mg by mouth 2 (two) times daily with a meal.   oxyCODONE 5 MG immediate release tablet Commonly known as: Oxy IR/ROXICODONE Take 1-2 tablets (5-10 mg total) by mouth every 4 (four) hours as needed for moderate pain (pain score 4-6).        Follow-up Information     [facility] Follow up.   Why: Hold off on chlorthalidone, losartan, and spironolactone until follow up with your PCP Contact information: [address] [facility] [place] [address] [phone]         Jadene Pierini, MD. Schedule an appointment as soon as possible for a visit in 2 week(s).   Specialty: Neurosurgery Why: To follow up on subarachnoid hemorrhage Contact information: [address] [place] [address] [phone]                No Known Allergies  Consultations: Neurosurgery    Procedures/Studies: VAS US CAROTID  Result Date:  07/12/2023 Carotid Arterial Duplex Study Patient Name:  Kristopher SCARPINATO Sr.  Date of Exam:   07/12/2023 Medical Rec #: [id number]              Accession #:    [id number] Date of Birth: 22-Jan-1961             Patient Gender: M Patient Age:   62 years Exam Location:  [facility] Procedure:      VAS US CAROTID Referring Phys: Victorino Dike Sela Falk --------------------------------------------------------------------------------  Indications:       Syncope. Risk Factors:      Hypertension. Other Factors:     Renal cell carcinoma. Trace subarachnoid hemorrhage along the                    anterior right frontal lobe.Severe spinal canal stenosis at                    C6-C7. Moderate spinal canal stenosis at C5-C6.  Comparison Study:  No prior study on file Performing Technologist: Sherren Kerns RVS  Examination Guidelines: A complete evaluation includes B-mode imaging, spectral Doppler, color Doppler, and power Doppler as needed of all accessible portions of each vessel. Bilateral testing  is considered an integral part of a complete examination. Limited examinations for reoccurring indications may be performed as noted.  Right Carotid Findings: +----------+--------+--------+--------+------------------+--------+           PSV cm/sEDV cm/sStenosisPlaque DescriptionComments +----------+--------+--------+--------+------------------+--------+ CCA Prox  69      17                                         +----------+--------+--------+--------+------------------+--------+ CCA Distal74      14              homogeneous                +----------+--------+--------+--------+------------------+--------+ ICA Prox  55      21              heterogenous               +----------+--------+--------+--------+------------------+--------+ ICA Mid   69      29                                         +----------+--------+--------+--------+------------------+--------+ ICA Distal75      32                                         +----------+--------+--------+--------+------------------+--------+ ECA       59      11                                         +----------+--------+--------+--------+------------------+--------+ +----------+--------+-------+--------+-------------------+           PSV cm/sEDV cmsDescribeArm Pressure (mmHG) +----------+--------+-------+--------+-------------------+ [id number]                                         +----------+--------+-------+--------+-------------------+ +---------+--------+--+--------+-+ VertebralPSV cm/s29EDV cm/s7 +---------+--------+--+--------+-+  Left Carotid Findings: +----------+--------+--------+--------+------------------+--------+           PSV cm/sEDV cm/sStenosisPlaque DescriptionComments +----------+--------+--------+--------+------------------+--------+ CCA Prox  84      18                                         +----------+--------+--------+--------+------------------+--------+ CCA  Distal70      19              homogeneous                +----------+--------+--------+--------+------------------+--------+ ICA Prox  48      18                                         +----------+--------+--------+--------+------------------+--------+ ICA Mid   65      27                                         +----------+--------+--------+--------+------------------+--------+ ICA Distal46      19                                         +----------+--------+--------+--------+------------------+--------+ ECA       81      15                                         +----------+--------+--------+--------+------------------+--------+ +----------+--------+--------+--------+-------------------+           PSV cm/sEDV cm/sDescribeArm Pressure (mmHG) +----------+--------+--------+--------+-------------------+ Subclavian69                                          +----------+--------+--------+--------+-------------------+ +---------+--------+--+--------+--+ VertebralPSV cm/s73EDV cm/s23 +---------+--------+--+--------+--+   Summary: Right Carotid: The extracranial vessels were near-normal with only minimal wall                thickening or plaque. Left Carotid: The extracranial vessels were near-normal with only minimal wall               thickening or plaque. Vertebrals:  Bilateral vertebral arteries demonstrate antegrade flow. Subclavians: Normal flow hemodynamics were seen in bilateral subclavian              arteries. *See table(s) above for measurements and observations.  Electronically signed by Gerarda Fraction on 07/12/2023 at 12:01:50 PM.    Final    DG Shoulder Right  Result Date: 07/11/2023 CLINICAL DATA:  Recent fall with right shoulder pain, initial encounter EXAM: RIGHT SHOULDER - 2+ VIEW COMPARISON:  None Available. FINDINGS: Mild degenerative changes of the acromioclavicular joint are seen. No fracture or dislocation is seen. No soft tissue abnormality is  noted. IMPRESSION: No acute abnormality noted. Electronically Signed   By: Alcide Clever M.D.   On: 07/11/2023 21:05   ECHOCARDIOGRAM COMPLETE  Result Date: 07/11/2023    ECHOCARDIOGRAM REPORT   Patient Name:   Kristopher OMANS Sr. Date of Exam: 07/11/2023 Medical Rec #:  [id number]  Height:       75.0 in Accession #:    [id number]            Weight:       266.8 lb Date of Birth:  Jan 12, 1961            BSA:          2.480 m Patient Age:    61 years              BP:           150/104 mmHg Patient Gender: M                     HR:           80 bpm. Exam Location:  Inpatient Procedure: 2D Echo, Color Doppler and Cardiac Doppler Indications:    syncope  History:        Patient has prior history of Echocardiogram examinations, most                 recent 05/29/2017. Risk Factors:Diabetes and Hypertension.  Sonographer:    Delcie Roch RDCS Referring Phys: [id number] Oliver Pila HALL  Sonographer Comments: Image acquisition challenging due to patient body habitus. IMPRESSIONS  1. Left ventricular ejection fraction, by estimation, is 60 to 65%. The left ventricle has normal function. The left ventricle has no regional wall motion abnormalities. There is severe left ventricular hypertrophy. Left ventricular diastolic parameters  are consistent with Grade I diastolic dysfunction (impaired relaxation).  2. Right ventricular systolic function is normal. The right ventricular size is normal. There is normal pulmonary artery systolic pressure.  3. The mitral valve is normal in structure. No evidence of mitral valve regurgitation. No evidence of mitral stenosis.  4. The aortic valve is tricuspid. Aortic valve regurgitation is mild. No aortic stenosis is present.  5. Aortic dilatation noted. There is mild dilatation of the ascending aorta, measuring 44 mm. There is mild dilatation of the aortic root, measuring 40 mm.  6. The inferior vena cava is normal in size with greater than 50% respiratory variability, suggesting  right atrial pressure of 3 mmHg. FINDINGS  Left Ventricle: Left ventricular ejection fraction, by estimation, is 60 to 65%. The left ventricle has normal function. The left ventricle has no regional wall motion abnormalities. The left ventricular internal cavity size was normal in size. There is  severe left ventricular hypertrophy. Left ventricular diastolic parameters are consistent with Grade I diastolic dysfunction (impaired relaxation). Right Ventricle: The right ventricular size is normal. No increase in right ventricular wall thickness. Right ventricular systolic function is normal. There is normal pulmonary artery systolic pressure. The tricuspid regurgitant velocity is 2.37 m/s, and  with an assumed right atrial pressure of 3 mmHg, the estimated right ventricular systolic pressure is 25.5 mmHg. Left Atrium: Left atrial size was normal in size. Right Atrium: Right atrial size was normal in size. Pericardium: There is no evidence of pericardial effusion. Mitral Valve: The mitral valve is normal in structure. No evidence of mitral valve regurgitation. No evidence of mitral valve stenosis. Tricuspid Valve: The tricuspid valve is normal in structure. Tricuspid valve regurgitation is trivial. No evidence of tricuspid stenosis. Aortic Valve: The aortic valve is tricuspid. Aortic valve regurgitation is mild. No aortic stenosis is present. Pulmonic Valve: The pulmonic valve was normal in structure. Pulmonic valve regurgitation is mild. No evidence of pulmonic stenosis. Aorta: Aortic dilatation noted. There is mild dilatation of the ascending aorta, measuring 44  mm. There is mild dilatation of the aortic root, measuring 40 mm. Venous: The inferior vena cava is normal in size with greater than 50% respiratory variability, suggesting right atrial pressure of 3 mmHg. IAS/Shunts: No atrial level shunt detected by color flow Doppler.  LEFT VENTRICLE PLAX 2D LVIDd:         4.50 cm   Diastology LVIDs:         3.10 cm   LV  e' medial:    7.18 cm/s LV PW:         1.60 cm   LV E/e' medial:  5.6 LV IVS:        1.50 cm   LV e' lateral:   8.16 cm/s LVOT diam:     2.40 cm   LV E/e' lateral: 4.9 LVOT Area:     4.52 cm  RIGHT VENTRICLE             IVC RV S prime:     15.70 cm/s  IVC diam: 1.50 cm TAPSE (M-mode): 1.6 cm LEFT ATRIUM             Index LA diam:        3.30 cm 1.33 cm/m LA Vol (A2C):   49.5 ml 19.96 ml/m LA Vol (A4C):   37.0 ml 14.92 ml/m LA Biplane Vol: 42.8 ml 17.26 ml/m   AORTA Ao Root diam: 4.00 cm Ao Asc diam:  4.40 cm MV E velocity: 39.90 cm/s  TRICUSPID VALVE MV A velocity: 62.20 cm/s  TR Peak grad:   22.5 mmHg MV E/A ratio:  0.64        TR Vmax:        237.00 cm/s                             SHUNTS                            Systemic Diam: 2.40 cm Vishnu Priya Mallipeddi Electronically signed by Winfield Rast Mallipeddi Signature Date/Time: 07/11/2023/3:09:36 PM    Final    CT HEAD WO CONTRAST ( )  Result Date: 07/11/2023 CLINICAL DATA:  62 year old male status post fall with trace right anterior frontal convexity subarachnoid hemorrhage. EXAM: CT HEAD WITHOUT CONTRAST TECHNIQUE: Contiguous axial images were obtained from the base of the skull through the vertex without intravenous contrast. RADIATION DOSE REDUCTION: This exam was performed according to the departmental dose-optimization program which includes automated exposure control, adjustment of the mA and/or kV according to patient size and/or use of iterative reconstruction technique. COMPARISON:  Head CT yesterday. FINDINGS: Brain: No IVH or ventriculomegaly. But ongoing evidence of trace right anterior frontal pole subarachnoid hemorrhage (coronal image 15). No discrete cerebral hemorrhagic contusion. No subdural or other extra-axial blood. No associated cerebral edema. No intracranial mass effect. Stable gray-white matter differentiation throughout the brain. No cortically based acute infarct identified. Vascular: Mild Calcified atherosclerosis at the  skull base. No suspicious intracranial vascular hyperdensity. Skull: Stable.  No acute osseous abnormality identified. Sinuses/Orbits: Visualized paranasal sinuses and mastoids are stable and well aerated. Other: Progressed and relatively large posterior convexity scalp hematoma eccentric to the left. No scalp soft tissue gas is identified. No underlying calvarium fracture identified. IMPRESSION: 1. Stable trace right anterior frontal convexity SAH. No intracranial mass effect or new intracranial hemorrhage. 2. Progressed and relatively large scalp hematoma. No skull fracture identified. Electronically Signed  By: Odessa Fleming M.D.   On: 07/11/2023 04:45   CT HEAD WO CONTRAST ( )  Result Date: 07/10/2023 CLINICAL DATA:  Fall EXAM: CT HEAD WITHOUT CONTRAST CT CERVICAL SPINE WITHOUT CONTRAST TECHNIQUE: Multidetector CT imaging of the head and cervical spine was performed following the standard protocol without intravenous contrast. Multiplanar CT image reconstructions of the cervical spine were also generated. RADIATION DOSE REDUCTION: This exam was performed according to the departmental dose-optimization program which includes automated exposure control, adjustment of the mA and/or kV according to patient size and/or use of iterative reconstruction technique. COMPARISON:  None Available. FINDINGS: CT HEAD FINDINGS Brain: Trace hyperdensity along the anterior right frontal lobe (series 3, image 20 and series 6, image 26), most likely subarachnoid hemorrhage. No evidence of parenchymal hemorrhage. No acute infarct, mass, mass effect, or midline shift. No hydrocephalus. Vascular: No hyperdense vessel. Skull: Negative for fracture or focal lesion. Large left parietal and occipital hematoma. Small right frontal scalp/forehead hematoma. Sinuses/Orbits: Remote right lamina papyracea fracture. Mucosal thickening in the ethmoid air cells. Mucous retention cysts in the maxillary sinuses. No acute finding in the orbits.  Other: The mastoid air cells are well aerated. CT CERVICAL SPINE FINDINGS Alignment: No traumatic listhesis. Skull base and vertebrae: No acute fracture or suspicious osseous lesion. Well corticated osseous fragment at the right aspect of C2 (series 4, image 44), likely sequela of a remote fracture. Soft tissues and spinal canal: No prevertebral fluid or swelling. No visible canal hematoma. Disc levels: Degenerative changes in the cervical spine. Moderate spinal canal stenosis at C5-C6. Severe spinal canal stenosis at C6-C7. Upper chest: Negative. IMPRESSION: 1. Trace subarachnoid hemorrhage along the anterior right frontal lobe. 2. Large left parietal and occipital scalp hematoma. Small right frontal scalp/forehead hematoma. No evidence of calvarial fracture. 3. No acute fracture or traumatic listhesis in the cervical spine. 4. Severe spinal canal stenosis at C6-C7. Moderate spinal canal stenosis at C5-C6. These results were called by telephone at the time of interpretation on 07/10/2023 at 7:57 pm to provider [facility] , who verbally acknowledged these results. Electronically Signed   By: Wiliam Ke M.D.   On: 07/10/2023 19:57   CT CERVICAL SPINE WO CONTRAST  Result Date: 07/10/2023 CLINICAL DATA:  Fall EXAM: CT HEAD WITHOUT CONTRAST CT CERVICAL SPINE WITHOUT CONTRAST TECHNIQUE: Multidetector CT imaging of the head and cervical spine was performed following the standard protocol without intravenous contrast. Multiplanar CT image reconstructions of the cervical spine were also generated. RADIATION DOSE REDUCTION: This exam was performed according to the departmental dose-optimization program which includes automated exposure control, adjustment of the mA and/or kV according to patient size and/or use of iterative reconstruction technique. COMPARISON:  None Available. FINDINGS: CT HEAD FINDINGS Brain: Trace hyperdensity along the anterior right frontal lobe (series 3, image 20 and series 6, image 26),  most likely subarachnoid hemorrhage. No evidence of parenchymal hemorrhage. No acute infarct, mass, mass effect, or midline shift. No hydrocephalus. Vascular: No hyperdense vessel. Skull: Negative for fracture or focal lesion. Large left parietal and occipital hematoma. Small right frontal scalp/forehead hematoma. Sinuses/Orbits: Remote right lamina papyracea fracture. Mucosal thickening in the ethmoid air cells. Mucous retention cysts in the maxillary sinuses. No acute finding in the orbits. Other: The mastoid air cells are well aerated. CT CERVICAL SPINE FINDINGS Alignment: No traumatic listhesis. Skull base and vertebrae: No acute fracture or suspicious osseous lesion. Well corticated osseous fragment at the right aspect of C2 (series 4, image 44), likely sequela of a  remote fracture. Soft tissues and spinal canal: No prevertebral fluid or swelling. No visible canal hematoma. Disc levels: Degenerative changes in the cervical spine. Moderate spinal canal stenosis at C5-C6. Severe spinal canal stenosis at C6-C7. Upper chest: Negative. IMPRESSION: 1. Trace subarachnoid hemorrhage along the anterior right frontal lobe. 2. Large left parietal and occipital scalp hematoma. Small right frontal scalp/forehead hematoma. No evidence of calvarial fracture. 3. No acute fracture or traumatic listhesis in the cervical spine. 4. Severe spinal canal stenosis at C6-C7. Moderate spinal canal stenosis at C5-C6. These results were called by telephone at the time of interpretation on 07/10/2023 at 7:57 pm to provider [facility] , who verbally acknowledged these results. Electronically Signed   By: Wiliam Ke M.D.   On: 07/10/2023 19:57   DG Pelvis Portable  Result Date: 07/10/2023 CLINICAL DATA:  Fall, pelvic injury EXAM: PORTABLE PELVIS 1-2 VIEWS COMPARISON:  None Available. FINDINGS: The right hand overlies the right iliac crest slightly limiting evaluation of this region. Normal alignment. No fracture or dislocation.  Sacroiliac and hip joint spaces are preserved. Soft tissues are unremarkable. IMPRESSION: 1. No fracture or dislocation. Electronically Signed   By: Helyn Numbers M.D.   On: 07/10/2023 19:16   DG Chest Portable 1 View  Result Date: 07/10/2023 CLINICAL DATA:  Fall EXAM: PORTABLE CHEST 1 VIEW COMPARISON:  10/05/2022 FINDINGS: Mild left basilar nodular infiltrate has developed possibly related to acute infection or aspiration. Lungs are otherwise clear. No pneumothorax. No pleural effusion. Cardiac size is within normal limits. No acute bone abnormality. IMPRESSION: 1. Mild left basilar nodular infiltrate, possibly infectious or inflammatory. Electronically Signed   By: Helyn Numbers M.D.   On: 07/10/2023 19:09       Discharge Exam: Vitals:   07/12/23 0729 07/12/23 1126  BP: (!) 140/93 (!) 150/94  Pulse: 70   Resp: 13   Temp: 97.6 F (36.4 C) 98.6 F (37 C)  SpO2: 96%     General: Pt is alert, awake, not in acute distress Cardiovascular: RRR, S1/S2 +, no edema Respiratory: CTA bilaterally, no wheezing, no rhonchi, no respiratory distress, no conversational dyspnea  Abdominal: Soft, NT, ND, bowel sounds + Extremities: no edema, no cyanosis Psych: Normal mood and affect, stable judgement and insight     The results of significant diagnostics from this hospitalization (including imaging, microbiology, ancillary and laboratory) are listed below for reference.     Microbiology: No results found for this or any previous visit (from the past 240 hour(s)).   Labs: BNP (last 3 results) Recent Labs    07/10/23 1903  BNP 12.0   Basic Metabolic Panel: Recent Labs  Lab 07/10/23 1847 07/11/23 0415 07/12/23 0417  NA 137 135 135  K 4.7 4.7 4.4  CL 109 110 107  CO2 17* 18* 20*  GLUCOSE 173* 161* 152*  BUN 47* 39* 31*  CREATININE 2.01* 1.66* 1.54*  CALCIUM 10.1 9.8 10.1  MG 1.8  --   --   PHOS  --  3.1  --    Liver Function Tests: Recent Labs  Lab 07/10/23 1847  AST 18   ALT 18  ALKPHOS 76  BILITOT 0.5  PROT 7.5  ALBUMIN 4.1   No results for input(s): "LIPASE", "AMYLASE" in the last 168 hours. No results for input(s): "AMMONIA" in the last 168 hours. CBC: Recent Labs  Lab 07/10/23 1847 07/11/23 0415 07/12/23 0417  WBC 9.3 11.6* 9.4  NEUTROABS 5.6  --   --   HGB 13.0  12.1* 13.0  HCT 39.1 36.8* 39.5  MCV 92.0 91.8 91.6  PLT 309 281 263   Cardiac Enzymes: No results for input(s): "CKTOTAL", "CKMB", "CKMBINDEX", "TROPONINI" in the last 168 hours. BNP: Invalid input(s): "POCBNP" CBG: Recent Labs  Lab 07/11/23 1115 07/11/23 1522 07/11/23 2053 07/12/23 0747 07/12/23 1131  GLUCAP 203* 176* 205* 140* 204*   D-Dimer No results for input(s): "DDIMER" in the last 72 hours. Hgb A1c Recent Labs    07/11/23 0415  HGBA1C 7.8*   Lipid Profile No results for input(s): "CHOL", "HDL", "LDLCALC", "TRIG", "CHOLHDL", "LDLDIRECT" in the last 72 hours. Thyroid function studies No results for input(s): "TSH", "T4TOTAL", "T3FREE", "THYROIDAB" in the last 72 hours.  Invalid input(s): "FREET3" Anemia work up No results for input(s): "VITAMINB12", "FOLATE", "FERRITIN", "TIBC", "IRON", "RETICCTPCT" in the last 72 hours. Urinalysis    Component Value Date/Time   COLORURINE YELLOW 03/28/2020 1203   APPEARANCEUR CLEAR 03/28/2020 1203   LABSPEC 1.010 03/28/2020 1203   PHURINE 5.0 03/28/2020 1203   GLUCOSEU NEGATIVE 03/28/2020 1203   HGBUR NEGATIVE 03/28/2020 1203   BILIRUBINUR NEGATIVE 03/28/2020 1203   KETONESUR NEGATIVE 03/28/2020 1203   PROTEINUR NEGATIVE 03/28/2020 1203   NITRITE NEGATIVE 03/28/2020 1203   LEUKOCYTESUR NEGATIVE 03/28/2020 1203   Sepsis Labs Recent Labs  Lab 07/10/23 1847 07/11/23 0415 07/12/23 0417  WBC 9.3 11.6* 9.4   Microbiology No results found for this or any previous visit (from the past 240 hour(s)).   Patient was seen and examined on the day of discharge and was found to be in stable condition. Time  coordinating discharge: 35 minutes including assessment and coordination of care, as well as examination of the patient.   SIGNED:  Noralee Stain, DO [facility] 07/12/2023, 12:15 PM

## 2023-07-12 NOTE — Progress Notes (Signed)
   Cardiology asked to place cardiac monitor for syncope work up. Follow up appt will be arranged.

## 2023-07-12 NOTE — Progress Notes (Signed)
VASCULAR LAB    Carotid duplex has been performed.  See CV proc for preliminary results.   Bevelyn Arriola, RVT 07/12/2023, 9:28 AM

## 2023-07-12 NOTE — TOC CAGE-AID Note (Signed)
Transition of Care (TOC) - CAGE-AID Screening   Patient Details  Name: Kristopher FRACASSO Sr. MRN: [id number] Date of Birth: Apr 05, 1961  [facility]) CM/SW Contact:    Janora Norlander, RN Phone Number: [phone] 07/12/2023, 12:45 PM   Clinical Narrative: Pt here after sustaining a SAH due to a fall.  Pt reports current alcohol use but no excessively.  Pt reports no drug use.  No resources needed.  Screening complete.   CAGE-AID Screening:    Have You Ever Felt You Ought to Cut Down on Your Drinking or Drug Use?: No Have People Annoyed You By Critizing Your Drinking Or Drug Use?: No Have You Felt Bad Or Guilty About Your Drinking Or Drug Use?: No Have You Ever Had a Drink or Used Drugs First Thing In The Morning to Steady Your Nerves or to Get Rid of a Hangover?: No CAGE-AID Score: 0  Substance Abuse Education Offered: No

## 2023-07-13 ENCOUNTER — Encounter: Payer: Self-pay | Admitting: *Deleted

## 2023-07-13 ENCOUNTER — Ambulatory Visit: Payer: No Typology Code available for payment source | Attending: Cardiology

## 2023-07-13 DIAGNOSIS — R55 Syncope and collapse: Secondary | ICD-10-CM

## 2023-07-13 NOTE — Progress Notes (Unsigned)
Enrolled for [employer] to mail a ZIO XT long term holter monitor to the patients address on file.   Letter with instructions mailed to patient.  Dr. Herbie Baltimore to read.

## 2023-08-07 ENCOUNTER — Ambulatory Visit: Payer: No Typology Code available for payment source | Admitting: General Practice

## 2023-08-11 ENCOUNTER — Telehealth: Payer: Self-pay | Admitting: *Deleted

## 2023-08-11 NOTE — Telephone Encounter (Signed)
We received the following email from [facility] regarding your ZIO XT monitor:  Hello,  This email is to notify you that we received the patient's Zio patch box back; however, the box was empty.   We attempted to contact the patient, but we were unable to reach them - we are unsure if the patient forgot to send the monitor back to [place] or if it fell out during transit.  If you do speak to the patient and they have the patch, we can ship them a replacement return box. If they state they have shipped it back, we advise you re-patch the patient. We apologize for the inconvenience.   [id number]   For more information about other unresolved or pending items, please log in to [facility] or refer to your Zio Action Report, which is sent every Monday and Thursday via email.    Thank you, [place] [employer] Customer Careceived the following notice from [employer] :  We do not know if you did not put the ZIO XT monitor in the box, or if the box was damaged and your monitor was lost in the mail.  Please contact  in the monitor department at [phone]. We can arrange to have a replacement monitor shipped to you if desired.

## 2024-05-24 ENCOUNTER — Ambulatory Visit (INDEPENDENT_AMBULATORY_CARE_PROVIDER_SITE_OTHER): Admitting: Orthopedic Surgery

## 2024-05-24 DIAGNOSIS — M19031 Primary osteoarthritis, right wrist: Secondary | ICD-10-CM | POA: Diagnosis not present

## 2024-05-24 DIAGNOSIS — M1811 Unilateral primary osteoarthritis of first carpometacarpal joint, right hand: Secondary | ICD-10-CM | POA: Diagnosis not present

## 2024-05-24 NOTE — Progress Notes (Signed)
 Kristopher Guernsey Sr. - 63 y.o. male MRN [id number]  Date of birth: 1961/01/17  Office Visit Note: Visit Date: 05/24/2024 PCP: [facility] [place] Referred by: Fulp, Nanette N, PA-C  Subjective: No chief complaint on file.  HPI: Kristopher ABBOUD Sr. is a pleasant 63 y.o. male who presents today for evaluation of occasional right wrist pain and basilar thumb pain.  He states that the pain is intermittent in nature, does have occasional flareups without significant injury.  Was originally treated for potential gout in the past by his primary care physician without significant resolution.  Has trialed some over-the-counter bracing without much relief.  Denies any numbness or tingling.  States the pain is relatively well-controlled today.  Pertinent ROS were reviewed with the patient and found to be negative unless otherwise specified above in HPI.   Visit Reason: right wrist pain, basilar thumb pain Duration of symptoms: 1 month Hand dominance: left Occupation:  Diabetic: Yes Smoking: No Heart/Lung History: none Blood Thinners:  none  Prior Testing/EMG:  04/22/24 Injections (Date): none Treatments: none Prior Surgery: none  Assessment & Plan: Visit Diagnoses:  1. Arthritis of carpometacarpal (CMC) joint of right thumb   2. 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  I reviewed these x-rays in detail with the patient today to better explain his symptoms and occasional flareups.  Given that his symptoms are relatively mild at this time, we can begin with conservative measures.  He was fitted for a Comfort Cool brace today in order to help offload the thumb basilar joint which is the most clinically symptomatic today based on examination.  I  did explain that in the future should the flareups become more consistent or his pain become more constant, he should return to me for further discussion regarding his ongoing arthritis at both the radiocarpal region as well as the thumb CMC region.  I discussed treatment modalities that would be available including anti-inflammatory medication, activity modification, therapy and potential cortisone injections.  I also discussed that there would be surgical options available to him should his symptoms remain refractory to conservative care.  He expressed understanding, was fitted for a Comfort Cool brace today, will return to me as needed moving forward.  Follow-up: No follow-ups on file.   Meds & Orders: No orders of the defined types were placed in this encounter.  No orders of the defined types were placed in this encounter.    Procedures: No procedures performed      Clinical History: No specialty comments available.  He reports that he has never smoked. He has never used smokeless tobacco.  Recent Labs    07/11/23 0415  HGBA1C 7.8*    Objective:   Vital Signs: There were no vitals taken for this visit.  Physical Exam  Gen: Well-appearing, in no acute distress; non-toxic CV: Regular Rate. Well-perfused. Warm.  Resp: Breathing unlabored on room air; no wheezing. Psych: Fluid speech in conversation; appropriate affect; normal thought process  Ortho Exam PHYSICAL EXAM:  General: Patient is well appearing and in no distress.  Skin and Muscle: No significant skin changes are apparent to hands.  Muscle bulk and contour normal, no signs of atrophy.     Range of Motion and Palpation Tests: Mobility is full  about the elbows with flexion and extension.  Forearm supination and pronation are 85/85 bilaterally.  Wrist flexion/extension is 75/65 left side, 55/45 right side.  Digital flexion and extension are full.  Thumb opposition is full to the base of the small fingers bilaterally.     No cords or nodules are palpated.  No triggering is observed.    Moderate tenderness over the right thumb CMC articulation is observed.  Positive grind right side for crepitus, moderate pain.   Slight clunk notable with Azalia Leo shift testing of the right wrist in comparison to the left  Neurologic, Vascular, Motor: Sensation is intact to light touch in the median/radial/ulnar distributions.    Fingers pink and well perfused.  Capillary refill is brisk.      Lab Results  Component Value Date   HGBA1C 7.8 (H) 07/11/2023     Imaging: No results found. Prior x-rays from the [facility] setting were reviewed today of both the right wrist and hand  Past Medical/Family/Surgical/Social History: Medications & Allergies reviewed per EMR, new medications updated. Patient Active Problem List   Diagnosis Date Noted   Syncope 07/11/2023   AKI (acute kidney injury) (HCC) 07/11/2023   DM (diabetes mellitus) (HCC) 07/11/2023   Obesity (BMI 30-39.9) 07/11/2023   SAH (subarachnoid hemorrhage) (HCC) 07/10/2023   Arthritis of right knee 04/05/2020   Arthritis of left knee 09/13/2019   Pain and swelling of ankle, right 12/09/2017   Hyperlipidemia with target LDL less than 100 06/18/2017   Hypertensive heart disease without heart failure 06/18/2017   Bilateral renal cysts 06/18/2017   Resistant hypertension 05/18/2017   H/O hypokalemia 05/18/2017   [place] cardiac risk 10-20% in next 10 years 03/17/2017   Diverticulosis of large intestine without hemorrhage 09/09/2016   Benign essential hypertension 07/15/2016   Acute bilateral ankle pain 09/04/2015   Closed nondisplaced fracture of navicular bone of left foot 09/04/2015   Pain in both feet 09/04/2015   Primary osteoarthritis of both knees 05/30/2015   Arthralgia of both knees 01/05/2015   Arthralgia of left hip 01/05/2015   Abnormal EKG 06/18/2014   Knee pain 03/28/2014   Past Medical History:  Diagnosis Date   Ambulates with  cane    Arthritis    CKD (chronic kidney disease)    stage III (nephrologist: Charley Constable, MD) 03/2019   Diabetes mellitus without complication (HCC)    Type 2   GERD (gastroesophageal reflux disease)    Hyperlipidemia    Hypertension    Seasonal allergies    Family History  Problem Relation Age of Onset   Cancer Mother    Cancer Father    Cancer Sister    Hypertension Sister    Cancer Brother    Hypertension Brother    Cancer Sister    Hypertension Sister    Past Surgical History:  Procedure Laterality Date   COLONOSCOPY     x2   HAND SURGERY Left 1983   KNEE ARTHROSCOPY Left 1986   MULTIPLE TOOTH EXTRACTIONS     RENAL ARTERY DOPPLERS  05/29/2017    Normal Renal A Bilaterally.  L > R kidney (3.2 cm) . Renal Cysts noted  - larges ~3.7 cm x 3.5 cm (R kidney), L kidney - ~1.7 cm x 1.6 cm -> recommend referral to Nephrology   TOTAL KNEE ARTHROPLASTY Left 09/13/2019   TOTAL KNEE ARTHROPLASTY Left 09/13/2019   Procedure: LEFT TOTAL KNEE ARTHROPLASTY;  Surgeon: Jasmine Mesi, MD;  Location: [facility] OR;  Service: Orthopedics;  Laterality: Left;   TOTAL KNEE ARTHROPLASTY Right 04/05/2020   Procedure: RIGHT TOTAL KNEE ARTHROPLASTY;  Surgeon: Jasmine Mesi, MD;  Location: [facility] OR;  Service: Orthopedics;  Laterality: Right;   TRANSTHORACIC ECHOCARDIOGRAM  05/2017   Normal Echo. Mod Conc LVH. EF 55-60%.  No RWMA. Normal Valves. Gr 1 DD.   UPPER GI ENDOSCOPY     2x   Social History   Occupational History   Not on file  Tobacco Use   Smoking status: Never   Smokeless tobacco: Never  Vaping Use   Vaping status: Never Used  Substance and Sexual Activity   Alcohol use: Yes    Comment: occasional beer/wine   Drug use: No   Sexual activity: Not on file    Jabier Deese Alvia Jointer, M.D. [facility], Hand Surgery

## 2024-10-20 NOTE — Discharge Summary (Signed)
 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  He presented to interventional radiology as an outpatient for cryoablation of that said lesion.  Procedure was completed however he unfortunately developed a hematoma post procedurally that warranted to be monitored by interventional radiology team.  Patient had slight drop in his hemoglobin on morning of October 24 down to 12.2 from 13.5 however his other labs including BUN/creatinine and renal function overall remained stable.  He does have a history of type 2 diabetes mellitus, gout, hyperlipidemia, hypertension and recommendation is that he continue with management of his chronic conditions with most of his home medications but I am going to recommend that he hold his home spironolactone  given slightly elevated potassium until he has follow-up with his primary care physician to recheck labs.  Otherwise he is hemodynamically stable and in no acute distress.  Believe he should do well at home.  His left flank/back dressings have minimal strikethrough.  Physical Exam Vitals:   10/19/24 2316 10/20/24 0305 10/20/24 0734 10/20/24 1115  BP: (!) 149/93 148/86 144/89 122/63  BP Location: Right arm Right arm Right arm Right arm  Patient Position: Lying Sitting Lying Lying  Pulse: 81 91 82 63  Resp: 18 18 18 18   Temp: 98.7 F (37.1 C) 98.8 F (37.1 C) 97.9 F (36.6 C) 97.7 F (36.5  C)  TempSrc: Oral Oral Oral Oral  SpO2: 99% 99% 98% 97%  General:  Alert and oriented, No acute distress. Neck:  Supple Respiratory:  Lungs are clear to auscultation, Respirations are non-labored, Breath sounds are equal, Symmetrical chest wall expansion.   Cardiovascular:  Normal rate, Regular rhythm, No murmur, Good pulses equal in all extremities, No edema.   Gastrointestinal:  Soft, Non-tender, Non-distended, Normal bowel sounds.   Musculoskeletal:  Normal range of motion, Normal strength, No tenderness, No swelling.   Integumentary:  Warm, Pink. Minimal strike through on flank dressing. Neurologic:  Alert, Oriented Cognition and Speech:  Oriented, Speech clear and coherent Psychiatric:  Cooperative, Appropriate mood & affect   Discharge Medications:   Discharge Medications     PAUSE taking these medications      Sig Disp Refill Start End  spironolactone  25 mg tablet Wait to take this until your doctor or other care provider tells you to start again. Commonly known as: ALDACTONE   Take 50 mg by mouth 2 (two) times a day.   0         Medications To Continue      Sig Disp Refill Start End  acetaminophen  500 mg tablet Commonly known as: TYLENOL   Take 2 tablets by mouth as needed (pain).   0     allopurinoL 100 mg tablet Commonly known as: ZYLOPRIM  Take 200 mg by mouth daily.  0     atorvastatin  10 mg tablet Commonly known as: LIPITOR  Take 10 mg by mouth daily.   0     carvediloL  25 mg tablet Commonly known as: COREG   Take 12.5 mg by mouth in the morning and 12.5 mg in the evening. Take with meals.   0     clotrimazole 1 % Soln Commonly known as: LOTRIMIN  Apply 1 Application topically 2 (two) times a day as needed (rash).   0     fluticasone propionate 50 mcg/spray nasal spray Commonly known as: FLONASE  Administer 2 sprays into each nostril as needed for allergies or rhinitis.   0     glipiZIDE  10 mg tablet Commonly known as: GLUCOTROL   Take 10  mg by mouth 2 (two) times a day.   0     hydrALAZINE  25 mg tablet Commonly known as: APRESOLINE   Take 25 mg by mouth daily. for high blood pressure   0     Jardiance 10 mg Tab Generic drug: empagliflozin  Take 1 tablet by mouth daily.   0     losartan  100 mg tablet Commonly known as: COZAAR   Take 100 mg by mouth daily.   0     omeprazole 20 mg DR capsule Commonly known as: PriLOSEC  Take 20 mg by mouth daily.   0           Follow Up Appointments: Future Appointments  Date Time Provider Department Center  10/31/2024  4:30 PM Ozell JINNY Pinal, MD [facility] IR CLN [facility]    Condition on Discharge: Stable Discharge Disposition: Home   Discharge Time: 35 mins  Signed Electronically:   Mitchael Blanch, MD 1:16 PM  10/20/2024  IR Cryo Ablation Renal Mass  Preliminary Result by Lonni Dallas Rummer, MD (10/22 1757)  IR CRYO ABLATION RENAL MASS, 10/19/2024 4:22 PM    INDICATION: Malignant neoplasm of left kidney, except renal pelvis      (CMD) \ C64.2 Malignant neoplasm of left kidney, except renal pelvis      (CMD)     ATTENDING PHYSICIAN: Ozell Pinal, MD    RESIDENT PHYSICIAN: Lonni Rummer, MD    APP: None.    PROCEDURE: CT-guided percutaneous cryoablation of a left renal mass.    FINDINGS:  1.  The planning CT demonstrates the left renal mass.  2.  Initial procedural images demonstrate appropriate positioning of the   ablation probes within the lesion.  3.  Images obtained during cryoablation show gradual encasement of the   mass by the ice ball.  The final images demonstrate appropriate post-ablative changes to the mass   complicated by small, nonexpanding perinephric hematoma.    IMPRESSION:  CONCLUSION:   Successful percutaneous cryoablation of a left renal mass.      PLAN:   The patient will follow-up with a renal protocol MRI in 3 months to assess   tumor response to therapy.        DETAILS OF PROCEDURE:    Informed consent  was obtained after a discussion of the risks, benefits,   and alternatives. The patient was placed prone on the gantry in the CT   suite. A planning CT was performed to localize the patient's known small   left renal mass. A mark was placed on the skin overlying the lesion, and a   limited field-of-view CT was repeated to confirm the percutaneous   trajectory.    The flank overlying the lesion was prepped and  draped in the usual sterile   fashion. Maximum sterile barriers were used, including cap, mask, hand   hygiene, sterile gloves, sterile gown, large sterile drape, and 2%   chlorhexidine  for cutaneous antisepsis. A standardized time-out was then   performed in accordance with the Universal Protocol guidelines to confirm   the correct patient, operative site, and procedure.    Two cryoprobes were advanced using intermittent CT-fluoroscopy into the   mass via separate percutaneous access sites. Hydrodissection was used to   displace the bowel and pancreatic tail anteriorly from the renal mass.    Cryoablation was then performed for 10 minutes at temperatures below -40   degrees Celsius. Images were obtained at 5 and 10 minutes to monitor   appropriate formation of the ice ball around the tumor. After 8 minutes of   passive thaw, cryoablation was repeated for an additional 10 minutes.   Again, images were obtained at 5 and 10 minutes to monitor appropriate   formation of the ice ball around the tumor.    After 5 minutes of active thaw, the cryoprobes were removed, with   temperatures measuring greater than 20 degrees Celsius. Final CT images   demonstrated a region of hypoattenuation extending appropriately beyond   the margins of the targeted mass in the left kidney. Small nonexpanding   perinephric and left flank abdominal musculature hematomas were noted at   the end of the procedure, and subsequently stable on repeat imaging.   Throughout the procedure, the patient's vital signs  remained stable within   normal limits.    COMPLICATIONS: No immediate complications. The patient tolerated the   procedure well and left the interventional radiology suite in stable   condition.    ESTIMATED BLOOD LOSS: None.    CONTRAST USED: 50 mL Omnipaque.    CT DOSE INDEX (CTDIvol): 5511 mGy.    SEDATION: Anesthesia support was used for this procedure. Please refer to   the separate anesthesia documentation for type and medications   administered.    ANTIBIOTICS: None administered during this procedure.        *Structured report code: IR.2.1       Recent Results (from the past 72 hours)  POC Glucose   Collection Time: 10/19/24  4:39 PM  Result Value Ref Range   Glucose, POC 177 (H) 70 - 99 mg/dL  POC Glucose   Collection Time: 10/19/24  8:35 PM  Result Value Ref Range   Glucose, POC 278 (H) 70 - 99 mg/dL  Basic Metabolic Panel   Collection Time: 10/20/24  5:43 AM  Result Value Ref Range   Sodium 136 136 - 145 mmol/L   Potassium 5.0 3.5 - 5.1 mmol/L   Chloride 107 98 - 107 mmol/L   CO2 21 21 - 31 mmol/L   Anion Gap 8 6 - 14 mmol/L   Glucose, Random 344 (H) 70 - 99 mg/dL   Blood Urea Nitrogen (BUN) 37 (H) 7 - 25 mg/dL   Creatinine 8.30 (H) 9.29 - 1.30 mg/dL   eGFR 44 (L) >40 fO/fpw/8.26f7   Calcium  9.6 8.6 - 10.3 mg/dL   BUN/Creatinine Ratio 21.9 (H) 10.0 - 20.0  POC Glucose   Collection Time: 10/20/24  7:33 AM  Result Value Ref Range   Glucose, POC 316 (H) 70 - 99 mg/dL  CBC without Differential   Collection Time: 10/20/24  8:11 AM  Result Value Ref Range   WBC 12.20 (H) 4.40 - 11.00 10*3/uL   RBC 4.19 (  L) 4.50 - 5.90 10*6/uL   Hemoglobin 12.2 (L) 14.0 - 17.5 g/dL   Hematocrit 62.7 (L) 58.4 - 50.4 %   Mean Corpuscular Volume (MCV) 88.8 80.0 - 96.0 fL   Mean Corpuscular Hemoglobin (MCH) 29.2 27.5 - 33.2 pg   Mean Corpuscular Hemoglobin Conc (MCHC) 32.9 (L) 33.0 - 37.0 g/dL   Red Cell Distribution Width (RDW) 14.4 12.3 - 17.0 %   Platelet Count (PLT)  273 150 - 450 10*3/uL   Mean Platelet Volume (MPV) 7.8 6.8 - 10.2 fL  POC Glucose   Collection Time: 10/20/24 11:46 AM  Result Value Ref Range   Glucose, POC 244 (H) 70 - 99 mg/dL
